# Patient Record
Sex: Female | Born: 1960
Health system: Southern US, Community
[De-identification: ages and names within clinical notes are randomized; demographics above are authoritative.]

## PROBLEM LIST (undated history)

## (undated) DIAGNOSIS — M545 Low back pain, unspecified: Secondary | ICD-10-CM

## (undated) DIAGNOSIS — M722 Plantar fascial fibromatosis: Secondary | ICD-10-CM

## (undated) DIAGNOSIS — F32A Depression, unspecified: Secondary | ICD-10-CM

## (undated) DIAGNOSIS — M5430 Sciatica, unspecified side: Secondary | ICD-10-CM

## (undated) DIAGNOSIS — G5621 Lesion of ulnar nerve, right upper limb: Secondary | ICD-10-CM

## (undated) DIAGNOSIS — K603 Anal fistula, unspecified: Secondary | ICD-10-CM

## (undated) DIAGNOSIS — M542 Cervicalgia: Secondary | ICD-10-CM

## (undated) DIAGNOSIS — R197 Diarrhea, unspecified: Secondary | ICD-10-CM

## (undated) DIAGNOSIS — Z8719 Personal history of other diseases of the digestive system: Secondary | ICD-10-CM

## (undated) DIAGNOSIS — I700 Atherosclerosis of aorta: Secondary | ICD-10-CM

## (undated) DIAGNOSIS — M5126 Other intervertebral disc displacement, lumbar region: Secondary | ICD-10-CM

## (undated) DIAGNOSIS — E039 Hypothyroidism, unspecified: Secondary | ICD-10-CM

## (undated) DIAGNOSIS — M5136 Other intervertebral disc degeneration, lumbar region: Secondary | ICD-10-CM

## (undated) DIAGNOSIS — F329 Major depressive disorder, single episode, unspecified: Secondary | ICD-10-CM

## (undated) DIAGNOSIS — R109 Unspecified abdominal pain: Secondary | ICD-10-CM

## (undated) DIAGNOSIS — G5601 Carpal tunnel syndrome, right upper limb: Principal | ICD-10-CM

## (undated) DIAGNOSIS — M51369 Other intervertebral disc degeneration, lumbar region without mention of lumbar back pain or lower extremity pain: Secondary | ICD-10-CM

## (undated) DIAGNOSIS — R7303 Prediabetes: Secondary | ICD-10-CM

## (undated) DIAGNOSIS — K219 Gastro-esophageal reflux disease without esophagitis: Secondary | ICD-10-CM

## (undated) DIAGNOSIS — R200 Anesthesia of skin: Secondary | ICD-10-CM

## (undated) DIAGNOSIS — M7020 Olecranon bursitis, unspecified elbow: Secondary | ICD-10-CM

## (undated) DIAGNOSIS — E559 Vitamin D deficiency, unspecified: Secondary | ICD-10-CM

## (undated) DIAGNOSIS — R29898 Other symptoms and signs involving the musculoskeletal system: Secondary | ICD-10-CM

## (undated) DIAGNOSIS — M25512 Pain in left shoulder: Secondary | ICD-10-CM

## (undated) DIAGNOSIS — M069 Rheumatoid arthritis, unspecified: Secondary | ICD-10-CM

## (undated) DIAGNOSIS — Z9889 Other specified postprocedural states: Secondary | ICD-10-CM

## (undated) DIAGNOSIS — D509 Iron deficiency anemia, unspecified: Secondary | ICD-10-CM

## (undated) HISTORY — DX: Other specified postprocedural states: Z98.890

## (undated) HISTORY — DX: Prediabetes: R73.03

## (undated) HISTORY — PX: COLONOSCOPY: SHX174

## (undated) HISTORY — PX: ENDOMETRIAL ABLATION: SHX621

## (undated) HISTORY — DX: Depression, unspecified: F32.A

## (undated) HISTORY — DX: Anal fistula: K60.3

## (undated) HISTORY — DX: Lesion of ulnar nerve, right upper limb: G56.21

## (undated) HISTORY — DX: Gastro-esophageal reflux disease without esophagitis: K21.9

## (undated) HISTORY — PX: OTHER SURGICAL HISTORY: SHX169

## (undated) HISTORY — PX: MOHS SURGERY: SHX181

## (undated) HISTORY — DX: Unspecified abdominal pain: R10.9

## (undated) HISTORY — DX: Anal fistula, unspecified: K60.30

## (undated) HISTORY — PX: GASTRIC BYPASS: SHX52

## (undated) HISTORY — PX: ESOPHAGOGASTRODUODENOSCOPY: SHX1529

## (undated) HISTORY — PX: BREAST LUMPECTOMY: SHX2

## (undated) HISTORY — DX: Major depressive disorder, single episode, unspecified: F32.9

## (undated) HISTORY — DX: Carpal tunnel syndrome, right upper limb: G56.01

---

## 2001-01-08 ENCOUNTER — Other Ambulatory Visit: Admission: RE | Admit: 2001-01-08 | Discharge: 2001-01-08 | Payer: Self-pay | Admitting: Obstetrics & Gynecology

## 2001-04-10 ENCOUNTER — Ambulatory Visit (HOSPITAL_COMMUNITY): Admission: RE | Admit: 2001-04-10 | Discharge: 2001-04-10 | Payer: Self-pay | Admitting: Family Medicine

## 2001-04-10 ENCOUNTER — Encounter: Payer: Self-pay | Admitting: Family Medicine

## 2001-05-14 ENCOUNTER — Encounter: Payer: Self-pay | Admitting: Family Medicine

## 2001-05-14 ENCOUNTER — Ambulatory Visit (HOSPITAL_COMMUNITY): Admission: RE | Admit: 2001-05-14 | Discharge: 2001-05-14 | Payer: Self-pay | Admitting: Family Medicine

## 2001-08-03 ENCOUNTER — Ambulatory Visit (HOSPITAL_COMMUNITY): Admission: RE | Admit: 2001-08-03 | Discharge: 2001-08-03 | Payer: Self-pay | Admitting: Family Medicine

## 2002-02-19 ENCOUNTER — Other Ambulatory Visit: Admission: RE | Admit: 2002-02-19 | Discharge: 2002-02-19 | Payer: Self-pay | Admitting: Obstetrics & Gynecology

## 2002-02-25 ENCOUNTER — Encounter: Payer: Self-pay | Admitting: Obstetrics & Gynecology

## 2002-02-25 ENCOUNTER — Ambulatory Visit (HOSPITAL_COMMUNITY): Admission: RE | Admit: 2002-02-25 | Discharge: 2002-02-25 | Payer: Self-pay | Admitting: Obstetrics & Gynecology

## 2002-04-30 ENCOUNTER — Encounter (HOSPITAL_BASED_OUTPATIENT_CLINIC_OR_DEPARTMENT_OTHER): Payer: Self-pay | Admitting: General Surgery

## 2002-05-02 ENCOUNTER — Encounter (INDEPENDENT_AMBULATORY_CARE_PROVIDER_SITE_OTHER): Payer: Self-pay | Admitting: *Deleted

## 2002-05-02 ENCOUNTER — Ambulatory Visit (HOSPITAL_COMMUNITY): Admission: RE | Admit: 2002-05-02 | Discharge: 2002-05-02 | Payer: Self-pay | Admitting: General Surgery

## 2002-11-04 ENCOUNTER — Ambulatory Visit (HOSPITAL_COMMUNITY): Admission: RE | Admit: 2002-11-04 | Discharge: 2002-11-04 | Payer: Self-pay | Admitting: Family Medicine

## 2002-11-04 ENCOUNTER — Encounter: Payer: Self-pay | Admitting: Family Medicine

## 2003-12-09 ENCOUNTER — Other Ambulatory Visit: Admission: RE | Admit: 2003-12-09 | Discharge: 2003-12-09 | Payer: Self-pay | Admitting: Obstetrics & Gynecology

## 2004-06-16 ENCOUNTER — Encounter: Admission: RE | Admit: 2004-06-16 | Discharge: 2004-06-16 | Payer: Self-pay | Admitting: Infectious Diseases

## 2005-01-14 ENCOUNTER — Other Ambulatory Visit: Admission: RE | Admit: 2005-01-14 | Discharge: 2005-01-14 | Payer: Self-pay | Admitting: Obstetrics & Gynecology

## 2007-12-10 ENCOUNTER — Ambulatory Visit (HOSPITAL_COMMUNITY): Admission: RE | Admit: 2007-12-10 | Discharge: 2007-12-10 | Payer: Self-pay | Admitting: General Surgery

## 2007-12-10 ENCOUNTER — Encounter (INDEPENDENT_AMBULATORY_CARE_PROVIDER_SITE_OTHER): Payer: Self-pay | Admitting: *Deleted

## 2008-05-23 ENCOUNTER — Ambulatory Visit (HOSPITAL_COMMUNITY): Admission: RE | Admit: 2008-05-23 | Discharge: 2008-05-23 | Payer: Self-pay | Admitting: Obstetrics & Gynecology

## 2008-05-23 ENCOUNTER — Encounter (INDEPENDENT_AMBULATORY_CARE_PROVIDER_SITE_OTHER): Payer: Self-pay | Admitting: Obstetrics & Gynecology

## 2008-08-19 ENCOUNTER — Ambulatory Visit (HOSPITAL_COMMUNITY): Admission: RE | Admit: 2008-08-19 | Discharge: 2008-08-19 | Payer: Self-pay | Admitting: Family Medicine

## 2008-08-19 ENCOUNTER — Encounter (INDEPENDENT_AMBULATORY_CARE_PROVIDER_SITE_OTHER): Payer: Self-pay | Admitting: *Deleted

## 2008-08-27 ENCOUNTER — Encounter (HOSPITAL_COMMUNITY): Admission: RE | Admit: 2008-08-27 | Discharge: 2008-09-26 | Payer: Self-pay | Admitting: Family Medicine

## 2008-08-27 ENCOUNTER — Encounter (INDEPENDENT_AMBULATORY_CARE_PROVIDER_SITE_OTHER): Payer: Self-pay | Admitting: *Deleted

## 2009-11-19 ENCOUNTER — Encounter (INDEPENDENT_AMBULATORY_CARE_PROVIDER_SITE_OTHER): Payer: Self-pay | Admitting: *Deleted

## 2009-12-24 ENCOUNTER — Encounter: Payer: Self-pay | Admitting: Internal Medicine

## 2010-01-01 DIAGNOSIS — K603 Anal fistula, unspecified: Secondary | ICD-10-CM | POA: Insufficient documentation

## 2010-01-01 DIAGNOSIS — R109 Unspecified abdominal pain: Secondary | ICD-10-CM | POA: Insufficient documentation

## 2010-01-01 DIAGNOSIS — K449 Diaphragmatic hernia without obstruction or gangrene: Secondary | ICD-10-CM | POA: Insufficient documentation

## 2010-01-01 DIAGNOSIS — K219 Gastro-esophageal reflux disease without esophagitis: Secondary | ICD-10-CM | POA: Insufficient documentation

## 2010-01-04 DIAGNOSIS — E0590 Thyrotoxicosis, unspecified without thyrotoxic crisis or storm: Secondary | ICD-10-CM | POA: Insufficient documentation

## 2010-01-07 ENCOUNTER — Ambulatory Visit: Payer: Self-pay | Admitting: Internal Medicine

## 2010-01-15 DIAGNOSIS — Z9889 Other specified postprocedural states: Secondary | ICD-10-CM

## 2010-01-15 HISTORY — DX: Other specified postprocedural states: Z98.890

## 2010-01-18 ENCOUNTER — Ambulatory Visit: Payer: Self-pay | Admitting: Internal Medicine

## 2010-01-20 ENCOUNTER — Encounter: Payer: Self-pay | Admitting: Internal Medicine

## 2010-02-05 ENCOUNTER — Ambulatory Visit (HOSPITAL_COMMUNITY): Admission: RE | Admit: 2010-02-05 | Discharge: 2010-02-05 | Payer: Self-pay | Admitting: Family Medicine

## 2010-11-18 NOTE — Letter (Signed)
Summary: New Patient letter  [facility]  [address] [place], [place] [address]   Phone: [phone]  Fax: [phone]       11/19/2009 MRN: [id number]  [name] [address] [place], [place]  [address]  Dear Ms. Dente,  Welcome to the Gastroenterology Division at [facility].    You are scheduled to see Dr.  Juanda Chance on 01-07-10 at 9:15a.m. on the 3rd floor at [facility] [address]  We ask that you try to arrive at our office 15 minutes prior to your appointment time to allow for check-in.  We would like you to complete the enclosed self-administered evaluation form prior to your visit and bring it with you on the day of your appointment.  We will review it with you.  Also, please bring a complete list of all your medications or, if you prefer, bring the medication bottles and we will list them.  Please bring your insurance card so that we may make a copy of it.  If your insurance requires a referral to see a specialist, please bring your referral form from your primary care physician.  Co-payments are due at the time of your visit and may be paid by cash, check or credit card.     Your office visit will consist of a consult with your physician (includes a physical exam), any laboratory testing he/she may order, scheduling of any necessary diagnostic testing (e.g. x-ray, ultrasound, CT-scan), and scheduling of a procedure (e.g. Endoscopy, Colonoscopy) if required.  Please allow enough time on your schedule to allow for any/all of these possibilities.    If you cannot keep your appointment, please call [phone] to cancel or reschedule prior to your appointment date.  This allows [place] the opportunity to schedule an appointment for another patient in need of care.  If you do not cancel or reschedule by 5 p.m. the business day prior to your appointment date, you will be charged a $50.00 late cancellation/no-show fee.    Thank you for choosing  [facility] for your medical needs.  We appreciate the opportunity to care for you.  Please visit [place] at our website  to learn more about our practice.                     Sincerely,                                                             The Gastroenterology Division

## 2010-11-18 NOTE — Assessment & Plan Note (Signed)
Summary: abdominal pain/gerd--ch.   History of Present Illness Visit Type: Initial Consult Primary GI MD: Lina Sar MD Primary Provider: Tessie Fass Requesting Provider: Tessie Fass Chief Complaint: abdominal pain with N/V, and also c/o Gerd at night History of Present Illness:   This is a 50 year old white female with epigastric pain, gastroesophageal reflux and rectal itching. She is status post mini gastric bypass in March 2009 at [facility] by Dr Clent Ridges. She has lost 84 pounds and has 15 more pounds to go. An upper GI series prior to surgery showed a small sliding hiatal hernia and thickened gastric folds consistent with gastritis. She had an anal fistula repaired in July 2007 and is having constipation and occasional rectal itching. Patient denies dysphagia but complains of food backing up at night despite taking Protonix 40 mg every morning. She had an episode of heartburn lasting all night last night after she had a barbecue supper at 8 PM and went to bed at 10 PM.   GI Review of Systems    Reports abdominal pain, acid reflux, belching, bloating, dysphagia with solids, heartburn, nausea, and  vomiting.     Location of  Abdominal pain: epigastric area.    Denies chest pain, dysphagia with liquids, loss of appetite, vomiting blood, weight loss, and  weight gain.        Denies anal fissure, black tarry stools, change in bowel habit, constipation, diarrhea, diverticulosis, fecal incontinence, heme positive stool, hemorrhoids, irritable bowel syndrome, jaundice, light color stool, liver problems, rectal bleeding, and  rectal pain.    Current Medications (verified): 1)  Pantoprazole Sodium 40 Mg Tbec (Pantoprazole Sodium) .Marland Kitchen.. 1 By Mouth Once Daily 2)  Levothyroxine Sodium 50 Mcg Tabs (Levothyroxine Sodium) .Marland Kitchen.. 1 By Mouth Once Daily 3)  Stool Softener 100 Mg Caps (Docusate Sodium) .Marland Kitchen.. 1 By Mouth Once Daily 4)  Vitamin D3 1000 Unit Tabs (Cholecalciferol) .Marland Kitchen.. 1 By Mouth  Two Times A Day 5)  Biotin 5000 5 Mg Caps (Biotin) .Marland Kitchen.. 1 By Mouth Once Daily 6)  Daily Vitamins  Tabs (Multiple Vitamin) .... 3 By Mouth Once Daily 7)  Tums 500 Mg Chew (Calcium Carbonate Antacid) .... 2 By Mouth Three Times A Day  Allergies (verified): 1)  ! Pcn  Past History:  Past Medical History: Current Problems:  Hx of ANAL FISTULA (ICD-565.1) HIATAL HERNIA (ICD-553.3) GERD (ICD-530.81) ABDOMINAL PAIN, UNSPECIFIED SITE (ICD-789.00) Arthritis Depression severe constipation  Past Surgical History: Reviewed history from 01/01/2010 and no changes required. Anal Fistulostomy w/marsupialization and excision of anal tags Gastric Bypass  Family History: No FH of Colon Cancer: Family History of Diabetes: Aunts, Uncles Family History of Heart Disease: Cousin, Father, Uncles Family History of Liver Disease:father  Social History: Patient is a former smoker. -stopped in 2001 Illicit Drug Use - no married 1 child Daily Caffeine Use  Review of Systems       The patient complains of anxiety-new, back pain, thirst - excessive, and vision changes.  The patient denies allergy/sinus, anemia, arthritis/joint pain, blood in urine, breast changes/lumps, change in vision, confusion, cough, coughing up blood, depression-new, fainting, fatigue, fever, headaches-new, hearing problems, heart murmur, heart rhythm changes, itching, menstrual pain, muscle pains/cramps, night sweats, nosebleeds, pregnancy symptoms, shortness of breath, skin rash, sleeping problems, sore throat, swelling of feet/legs, swollen lymph glands, thirst - excessive , urination - excessive , urination changes/pain, urine leakage, and voice change.         Pertinent positive and negative review of systems were  noted in the above HPI. All other ROS was otherwise negative.   Vital Signs:  Patient profile:   50 year old female Height:      65 inches Weight:      177 pounds BMI:     29.56 BSA:     1.88 Pulse rate:   58  / minute Pulse rhythm:   regular BP sitting:   102 / 70  (left arm)  Vitals Entered By: Merri Ray CMA Duncan Dull) (January 07, 2010 9:33 AM)  Physical Exam  General:  Well developed, well nourished, no acute distress. Eyes:  PERRLA, no icterus. Mouth:  No deformity or lesions, dentition normal. Neck:  Supple; no masses or thyromegaly. Lungs:  Clear throughout to auscultation. Heart:  Regular rate and rhythm; no murmurs, rubs,  or bruits. Abdomen:  soft abdomen with normoactive bowel sounds. No distention. No tenderness. Prominent subxiphoid process. No palpable mass. Post laparoscopic surgery scars. Rectal:  external hemorrhoidal tags with normal rectal tone. Stool is impacted and there is a large amount of Hemoccult-negative stool. Extremities:  No clubbing, cyanosis, edema or deformities noted. Skin:  Intact without significant lesions or rashes. Psych:  Alert and cooperative. Normal mood and affect.   Impression & Recommendations:  Problem # 1:  Hx of ANAL FISTULA (ICD-565.1) Patient is status post past repair of an anal fistula. She now has rectal itching likely related to redundant rectal tissue and straining. I have advised her to stay on stool softeners one a day and use prune juice and probiotics to improve her bowel habits. She is 50 years old and we will go ahead and schedule a colonoscopy.  Problem # 2:  GERD (ICD-530.81)  Patient has progressive gastroesophageal reflux likely related to her mini gastric bypass surgery resulting in a small gastric pouch. She has increased gastroesophageal reflux at night as well as during the day. We will increase her Protonix to 40 mg twice a day and if not covered by her insurance we will substitute omeprazole or Prilosec for the second Protonix. I have talked to her about cutting back on the size of her meals at night and not not eating for at least 3 hours prior to retiring at night. She may benefit from Reglan in the future. We will  schedule an upper endoscopy to assess the size of the gastric pouch and to rule out Barrett's esophagus.  Orders: Colon/Endo (Colon/Endo)  Patient Instructions: 1)  antireflux measures. 2)  Increase her Protonix to 40 mg p.o. b.i.d. 3)  Upper endoscopy and colonoscopy. 4)  Stool softeners daily. 5)  Calmoseptine solution to use p.r.n. rectal itching. 6)  Copy sent to : Dr Lilyan Punt 7)  The medication list was reviewed and reconciled.  All changed / newly prescribed medications were explained.  A complete medication list was provided to the patient / caregiver. Prescriptions: PANTOPRAZOLE SODIUM 40 MG TBEC (PANTOPRAZOLE SODIUM) Take 1 tablet by mouth two times a day  #60 x 3   Entered by:   Hortense Ramal CMA (AAMA)   Authorized by:   Hart Carwin MD   Signed by:   Hortense Ramal CMA (AAMA) on 01/07/2010   Method used:   Electronically to        [facility] (retail)       [address]       [place], [place]  [address]       Ph: [phone]  Fax: [phone]   RxID:   [id number] DULCOLAX 5 MG  TBEC (BISACODYL) Day before procedure take 2 at 3pm and 2 at 8pm.  #4 x 0   Entered by:   Hortense Ramal CMA (AAMA)   Authorized by:   Hart Carwin MD   Signed by:   Hortense Ramal CMA (AAMA) on 01/07/2010   Method used:   Electronically to        [facility] (retail)       [address]       [place], [place]  [address]       Ph: [phone]       Fax: [phone]   RxID:   [id number] REGLAN 10 MG  TABS (METOCLOPRAMIDE HCL) As per prep instructions.  #2 x 0   Entered by:   Hortense Ramal CMA (AAMA)   Authorized by:   Hart Carwin MD   Signed by:   Hortense Ramal CMA (AAMA) on 01/07/2010   Method used:   Electronically to        [facility] (retail)       [address]       [place], [place]  [address]       Ph: [phone]       Fax: [phone]   RxID:    [id number] MIRALAX   POWD (POLYETHYLENE GLYCOL 3350) As per prep  instructions.  #255gm x 0   Entered by:   Hortense Ramal CMA (AAMA)   Authorized by:   Hart Carwin MD   Signed by:   Hortense Ramal CMA (AAMA) on 01/07/2010   Method used:   Electronically to        [facility] (retail)       [address]       [place], [place]  [address]       Ph: [phone]       Fax: [phone]   RxID:   [id number]

## 2010-11-18 NOTE — Procedures (Signed)
Summary: Colonoscopy  Patient: [name] Note: All result statuses are Final unless otherwise noted.  Tests: (1) Colonoscopy (COL)   COL Colonoscopy           DONE     [facility]     [address]     [place], [place]  [address]           COLONOSCOPY PROCEDURE REPORT           PATIENT:  [name], [name]  MR#:  [id number]     BIRTHDATE:  1960-11-09, 49 yrs. old  GENDER:  female     ENDOSCOPIST:  Hedwig Morton. Juanda Chance, MD     REF. BY:  Lilyan Punt, M.D.     PROCEDURE DATE:  01/18/2010     PROCEDURE:  Colonoscopy [address]     ASA CLASS:  Class II     INDICATIONS:  hematochezia, change in bowel habits anal fistula     repaired 2007     MEDICATIONS:   Versed 2 mg, Fentanyl 50 mcg           DESCRIPTION OF PROCEDURE:   After the risks benefits and     alternatives of the procedure were thoroughly explained, informed     consent was obtained.  Digital rectal exam was performed and     revealed no rectal masses.   The LB [id number] endoscope     was introduced through the anus and advanced to the cecum, which     was identified by both the appendix and ileocecal valve, without     limitations.  The quality of the prep was good, using MiraLax.     The instrument was then slowly withdrawn as the colon was fully     examined.     <<PROCEDUREIMAGES>>           FINDINGS:  Three polyps were found. 3 diminutive polyps at 10 cm     The polyps were removed using cold biopsy forceps (see image9 and     image8).  Mild diverticulosis was found.  This was otherwise a     normal examination of the colon (see image1, image3, image4,     image5, and image10).   Retroflexed views in the rectum revealed     no abnormalities.    The scope was then withdrawn from the patient     and the procedure completed.           COMPLICATIONS:  None     ENDOSCOPIC IMPRESSION:     1) Three polyps     2) Mild diverticulosis     3) Otherwise normal examination     anorectal source for hematochezia     RECOMMENDATIONS:     1) Await pathology results     Calmoseptine ointment,samples given, use prn itching and     irritation     REPEAT EXAM:  In 10 year(s) for.           ______________________________     Hedwig Morton. Juanda Chance, MD           CC:           n.     eSIGNED:   Hedwig Morton. Natonya Finstad at 01/18/2010 04:27 PM           Chryl Heck, [id number]  Note: An exclamation mark (!) indicates a result that was not dispersed into the flowsheet. Document Creation Date: 01/18/2010 4:28 PM _______________________________________________________________________  (1) Order result status:  Final Collection or observation date-time: 01/18/2010 16:04 Requested date-time:  Receipt date-time:  Reported date-time:  Referring Physician:   Ordering Physician: Lina Sar [phone]) Specimen Source:  Source: Launa Grill Order Number: [id number] Lab site:   Appended Document: Colonoscopy     Procedures Next Due Date:    Colonoscopy: 01/2020

## 2010-11-18 NOTE — Procedures (Signed)
Summary: Upper Endoscopy  Patient: [name] Note: All result statuses are Final unless otherwise noted.  Tests: (1) Upper Endoscopy (EGD)   EGD Upper Endoscopy       DONE     [facility]     [address]     [place], [place]  [address]           ENDOSCOPY PROCEDURE REPORT           PATIENT:  [name], [name]  MR#:  [id number]     BIRTHDATE:  1961/01/25, 49 yrs. old  GENDER:  female           ENDOSCOPIST:  Hedwig Morton. Juanda Chance, MD     Referred by:  Lilyan Punt, M.D.           PROCEDURE DATE:  01/18/2010     PROCEDURE:  EGD with biopsy     ASA CLASS:  Class II     INDICATIONS:  heartburn, GERD, abdominal pain s/p gastric     minibypass 12/2007 in [place] food regurgitates at night     refractory to Protonix           MEDICATIONS:   Versed 8 mg, Fentanyl 50 mcg     TOPICAL ANESTHETIC:  Exactacain Spray           DESCRIPTION OF PROCEDURE:   After the risks benefits and     alternatives of the procedure were thoroughly explained, informed     consent was obtained.  The LB GIF-H180 [id number] endoscope was     introduced through the mouth and advanced to the proximal jejunum,     without limitations.  The instrument was slowly withdrawn as the     mucosa was fully examined.     <<PROCEDUREIMAGES>>           Post-operative change was noted (see image1, image2, image3, and     image10). g-e junction at 35 cm, appears normal, no hiatal hernis     gastric staple at 39 cm,generous opening into antrum, scope passes     easily,     proximal puch size is 4 cm, there is bile cvollected in the pouch     Post-operative change was noted (see image9, image8, image7, and     image6). functioning gastrojejunostomy with afferent and efferent     limb, and bile reflux  other findings (see image5 and image4).     normal jejunum and duodenum,    Retroflexed views revealed no     abnormalities.    The scope was then withdrawn from the patient     and the procedure completed.        COMPLICATIONS:  None           ENDOSCOPIC IMPRESSION:     1) Post-operative change     2) Post-operative change     3) Other findings     #1 gastric sta[ple resulting in a 4 cm gastric pouch with a     generous opening into antrum     #2 gastrojejunostomy, bile reflux     s/p small bolw biopsies     RECOMMENDATIONS:     1) Await pathology results     PPI bid,     small feedings, low roughage diet     Reglan 5 mg hs,#30, 1 refill           REPEAT EXAM:  In 0 year(s) for.  ______________________________     Hedwig Morton. Juanda Chance, MD           CC:           n.     eSIGNED:   Hedwig Morton. Dareon Nunziato at 01/18/2010 04:21 PM           Page 2 of 3   [name], [name], [id number]  Note: An exclamation mark (!) indicates a result that was not dispersed into the flowsheet. Document Creation Date: 01/18/2010 4:22 PM _______________________________________________________________________  (1) Order result status: Final Collection or observation date-time: 01/18/2010 15:44 Requested date-time:  Receipt date-time:  Reported date-time:  Referring Physician:   Ordering Physician: Lina Sar [phone]) Specimen Source:  Source: Launa Grill Order Number: [id number] Lab site:

## 2010-11-18 NOTE — Letter (Signed)
Summary: [facility] Instructions  [facility]  [address] [place], [place] [address]   Phone: [phone]  Fax: [phone]       [name]    [birth year]    MRN: [id number]       Procedure Day /Date: 01/18/10 Monday     Arrival Time: 2:00 pm     Procedure Time: 3:00 pm     Location of Procedure:                    _x _  [facility] (4th Floor)  PREPARATION FOR COLONOSCOPY WITH MIRALAX  Starting 5 days prior to your procedure (01/13/10) do not eat nuts, seeds, popcorn, corn, beans, peas,  salads, or any raw vegetables.  Do not take any fiber supplements (e.g. Metamucil, Citrucel, and Benefiber). ____________________________________________________________________________________________________   THE DAY BEFORE YOUR PROCEDURE         DATE: 01/17/10 DAY: Sunday  1   Drink clear liquids the entire day-NO SOLID FOOD  2   Do not drink anything colored red or purple.  Avoid juices with pulp.  No orange juice.  3   Drink at least 64 oz. (8 glasses) of fluid/clear liquids during the day to prevent dehydration and help the prep work efficiently.  CLEAR LIQUIDS INCLUDE: Water Jello Ice Popsicles Tea (sugar ok, no milk/cream) Powdered fruit flavored drinks Coffee (sugar ok, no milk/cream) Gatorade Juice: apple, white grape, white cranberry  Lemonade Clear bullion, consomm, broth Carbonated beverages (any kind) Strained chicken noodle soup Hard Candy  4   Mix the entire bottle of Miralax with 64 oz. of Gatorade/Powerade in the morning and put in the refrigerator to chill.  5   At 3:00 pm take 2 Dulcolax/Bisacodyl tablets.  6   At 4:30 pm take one Reglan/Metoclopramide tablet.  7  Starting at 5:00 pm drink one 8 oz glass of the Miralax mixture every 15-20 minutes until you have finished drinking the entire 64 oz.  You should pace yourself to insure that you can keep the liquid down. It normally takes around 2 hours to drink, but due to your gastric  stapling, it may take 4-6 hours for you to drink the entire 64 ounces.  8   If you are nauseated, you may take the 2nd Reglan/Metoclopramide tablet at 8:30 pm.        9    At 10:00 pm take 2 more DULCOLAX/Bisacodyl tablets.       THE DAY OF YOUR PROCEDURE      DATE:  01/18/10 DAY: Monday  You may drink clear liquids until 1:00 pm  (2 HOURS BEFORE PROCEDURE).   MEDICATION INSTRUCTIONS  Unless otherwise instructed, you should take regular prescription medications with a small sip of water as early as possible the morning of your procedure.         OTHER INSTRUCTIONS  You will need a responsible adult at least 49 years of age to accompany you and drive you home.   This person must remain in the waiting room during your procedure.  Wear loose fitting clothing that is easily removed.  Leave jewelry and other valuables at home.  However, you may wish to bring a book to read or an iPod/MP3 player to listen to music as you wait for your procedure to start.  Remove all body piercing jewelry and leave at home.  Total time from sign-in until discharge is approximately 2-3 hours.  You should go home directly after  your procedure and rest.  You can resume normal activities the day after your procedure.  The day of your procedure you should not:   Drive   Make legal decisions   Operate machinery   Drink alcohol   Return to work  You will receive specific instructions about eating, activities and medications before you leave.   The above instructions have been reviewed and explained to me by   Hortense Ramal CMA Duncan Dull)  January 07, 2010 10:47 AM     I fully understand and can verbalize these instructions _____________________________ Date 01/07/10

## 2010-11-18 NOTE — Letter (Signed)
Summary: Patient [facility] Biopsy Results  [facility]  [address] [place], [place] [address]   Phone: [phone]  Fax: [phone]        January 20, 2010 MRN: [id number]    ALLYSE FREGEAU [address] [place], [place]  [address]    Dear Ms. Paris,  I am pleased to inform you that the biopsies taken during your recent endoscopic examination did not show any evidence of cancer upon pathologic examination.  Additional information/recommendations:  __No further action is needed at this time.  Please follow-up with      your primary care physician for your other healthcare needs.  __ Please call [phone] to schedule a return visit to review      your condition.  _x_ Continue with the treatment plan as outlined on the day of your      exam.  _   Please call us if you are having persistent problems or have questions about your condition that have not been fully answered at this time.  Sincerely,  Hart Carwin MD  This letter has been electronically signed by your physician.  Appended Document: Patient Notice-Endo Biopsy Results Letter mailed 4.8.11

## 2010-11-18 NOTE — Letter (Signed)
Summary: Patient Notice- Polyp Results  [facility]  [address] [place], [place] [address]   Phone: [phone]  Fax: [phone]        January 20, 2010 MRN: [id number]    MARILYN NIHISER [address] [place], [place]  [address]    Dear Ms. [place],  I am pleased to inform you that the colon polyp(s) removed during your recent colonoscopy was (were) found to be benign (no cancer detected) upon pathologic examination.The polyp was hyperplastic ( not precancerous)  I recommend you have a repeat colonoscopy examination in 10_ years to look for recurrent polyps, as having colon polyps increases your risk for having recurrent polyps or even colon cancer in the future.  Should you develop new or worsening symptoms of abdominal pain, bowel habit changes or bleeding from the rectum or bowels, please schedule an evaluation with either your primary care physician or with me.  Additional information/recommendations: x __ No further action with gastroenterology is needed at this time. Please      follow-up with your primary care physician for your other healthcare      needs.  __ Please call [phone] to schedule a return visit to review your      situation.  __ Please keep your follow-up visit as already scheduled.  __ Continue treatment plan as outlined the day of your exam.  Please call us if you are having persistent problems or have questions about your condition that have not been fully answered at this time.  Sincerely,  Hart Carwin MD  This letter has been electronically signed by your physician.  Appended Document: Patient Notice- Polyp Results Letter mailed 4.8.11

## 2010-11-18 NOTE — Miscellaneous (Signed)
Summary: reglan prescription  Clinical Lists Changes  Medications: Added new medication of REGLAN 5 MG  TABS (METOCLOPRAMIDE HCL) take one at bedtime - Signed Rx of REGLAN 5 MG  TABS (METOCLOPRAMIDE HCL) take one at bedtime;  #30 x 1;  Signed;  Entered by: Laverna Peace RN;  Authorized by: Hart Carwin MD;  Method used: Electronically to [facility] [address], [place], [place], [place]  [address], Ph: [phone], Fax: [phone]    Prescriptions: REGLAN 5 MG  TABS (METOCLOPRAMIDE HCL) take one at bedtime  #30 x 1   Entered by:   Laverna Peace RN   Authorized by:   Hart Carwin MD   Signed by:   Laverna Peace RN on 01/18/2010   Method used:   Electronically to        [facility] (retail)       [address]       [place], [place]  [address]       Ph: [phone]       Fax: [phone]   RxID:   [id number]

## 2010-11-18 NOTE — Op Note (Signed)
Summary: Anal Fistula                     [place]  Patient:    [name], [name] Visit Number: [id number] MRN: [id number]          Service Type: DSU Location: [facility] 2870 01 Attending Physician:  Sonda Primes Dictated by:   Mardene Celeste Lurene Shadow, M.D. Proc. Date: 05/02/02 Admit Date:  05/02/2002 Discharge Date: 05/02/2002                             Operative Report  PREOPERATIVE DIAGNOSIS:  Anal fistula.  POSTOPERATIVE DIAGNOSIS:  Anal fistula.  OPERATION PERFORMED:  Proctosigmoidoscopy to 25 cm.  Anal fistulostomy with marsupialization and excision of anal tags.  SURGEON:  Mardene Celeste. Lurene Shadow, M.D.  ASSISTANT:  Nurse.  ANESTHESIA:  General.  INDICATIONS FOR PROCEDURE:  The patient is a 50 year old female who presents with recurrent draining sinus in the perianal tissues.  She on evaluation has what appears to be a transphincteric fistula.  She comes to the operating room after the risks and benefits of surgery have been fully discussed with her. All questions were answered and she gives full consent.  DESCRIPTION OF PROCEDURE:  Following the induction of satisfactory anesthesia with the patient positioned in the prone jackknife position, the perianal tissues were prepped and draped to be included in the sterile operative field. A proctosigmoidoscope was passed up to 25 cm without difficulty, no addition mucosal abnormalities were seen.  The fistula was cannulated with a probe and then with a blunt needle with peroxide is injected into the fistula and the internal fistulous opening could be seen in the inferior midline.  The probe was passed through all the way into the internal fistulous opening.  It was noted that it goes across the external sphincter just with taking approximately the distal 25 cm of the anal sphincter.  The fistula was cut down on with electrocautery after with injecting the fistulous tract with 0.5% Marcaine with  epinephrine, the tract was completely opened up to the anal verge and into the anal opening.  Hemostasis was obtained with the electrocautery.  The tract was then scraped clean of all the debris within the tract and the tract was then marsupialized with a running 2-0 chromic catgut suture catching the mucosa and skin to the base of the fistula.  A small anal tag at the anal verge right at where the fistula is was removed.  The fistulous tract was packed with Xeroform gauze and covered with a sterile dressing.  Anesthetic reversed.  Patient removed from the operating room to the recovery room in stable condition having tolerated the procedure well. Dictated by:   Mardene Celeste. Lurene Shadow, M.D. Attending Physician:  Sonda Primes DD:  05/02/02 TD:  05/06/02 Job: [id number]

## 2011-01-06 ENCOUNTER — Encounter: Payer: Self-pay | Admitting: Gastroenterology

## 2011-01-17 ENCOUNTER — Encounter: Payer: Self-pay | Admitting: Gastroenterology

## 2011-01-18 ENCOUNTER — Other Ambulatory Visit: Payer: Self-pay | Admitting: Internal Medicine

## 2011-01-18 ENCOUNTER — Ambulatory Visit (INDEPENDENT_AMBULATORY_CARE_PROVIDER_SITE_OTHER): Payer: 59 | Admitting: Gastroenterology

## 2011-01-18 ENCOUNTER — Encounter: Payer: Self-pay | Admitting: Gastroenterology

## 2011-01-18 DIAGNOSIS — K5900 Constipation, unspecified: Secondary | ICD-10-CM

## 2011-01-18 DIAGNOSIS — K219 Gastro-esophageal reflux disease without esophagitis: Secondary | ICD-10-CM

## 2011-01-18 DIAGNOSIS — R1032 Left lower quadrant pain: Secondary | ICD-10-CM

## 2011-01-18 NOTE — Patient Instructions (Signed)
Continue Protonix as directed Complete Upper GI x ray to assess your anatomy We will obtain the operative report from your surgeon Make sure you are drinking 6-8 glasses of water a day Keep a diary of when you have episodes of diarrhea; this may be contributed to foods you are eating and may not realize it Continue stool softeners daily. May take Miralax as needed for constipation.  After review of xray, we will determine your follow-up appt.

## 2011-01-19 ENCOUNTER — Encounter: Payer: Self-pay | Admitting: Gastroenterology

## 2011-01-19 DIAGNOSIS — K5900 Constipation, unspecified: Secondary | ICD-10-CM | POA: Insufficient documentation

## 2011-01-19 DIAGNOSIS — R1032 Left lower quadrant pain: Secondary | ICD-10-CM | POA: Insufficient documentation

## 2011-01-19 NOTE — Progress Notes (Signed)
Pt likely has some form of dumping syndrome after overeating in light of her mini GB or IBS-mixed. Weight 177 lbs in 2011. Please call pt. She needs needs to add Benefiber once daily to address constipation and follow her post-gastrectomy diet to prevent loose stools. Use Miralax only 1-2 times a week to have a BM.

## 2011-01-19 NOTE — Assessment & Plan Note (Signed)
50 year old female s/p mini gastric bypass in 2009 by Dr. Clent Ridges with chronic GERD. Controlled on BID Protonix; however, does experience nocturnal reflux depending on timing of meal/liquids in evening. Will likely have issues with this long-term due to anatomy of operation: prone to bile reflux and ulcer formation. Will likely need PPI indefinitely; monitor for signs/symptoms of ulcers. Pt reports decreased early satiety, which is not uncommon this far out from procedure. Discussed with pt diet and behavior modification. Short of revision surgery, intervention will likely need to be nutrition counseling and exercise. Due to lack of records, will proceed with Upper GI barium swallow to assess anatomy, pouch integrity, etc.  1. Upper GI 2. Continue Protonix BID 3. Nutrition Counseling likely in future. 4. Exercise most days of the week

## 2011-01-19 NOTE — Assessment & Plan Note (Signed)
Constipation dominant, on stool softener daily. No warning signs, needs bowel regimen.  1. Stool softeners daily 2. Miralax daily prn constipation. Hold if loose stools. 3. High fiber diet 4. 6-8 glasses of water daily 5. Exercise most days of week

## 2011-01-19 NOTE — Progress Notes (Signed)
Referring Provider: [facility] NP Primary Care Physician:  Lilyan Punt, MD  Chief Complaint  Patient presents with  . Pre-op Exam    previous gastric bypass, EGD    HPI:  [name] is a 50 y.o. female here as a referral from [place] [facility]'s bariatric program secondary to lack of early satiety. She underwent a mini gastric bypass in March 2009 by Dr. Clent Ridges. Pre-op wt 256, now 180. She has done overall fairly well since surgery. She had some issues with increased reflux and abdominal pain last year; she underwent an EGD and colonoscopy by Dr. Juanda Chance. EGD essentially normal, colonoscopy with mild diverticulosis, hyperplastic polyps.  Presents today with main concern of eating more than she used to. Reports some days able to eat a large plate-size of food, other days full of small amounts. Denies N/V or epigastric pain. Has increased reflux if eats right before bed. Admits the majority of her nocturnal reflux is a result of eating too soon before bed or drinking fruit juices in the middle of the night. On Protonix BID. Also reports intermittent LLQ discomfort that immediately precedes "explosion" of loose stool. Only happened a few times in the past few months. Unsure if associated with types of food. Does have constipation predominantly, takes stool softener daily. No other bowel regimen.   Past Medical History  Diagnosis Date  . Anal fistula     hx of   . Hiatal hernia   . GERD (gastroesophageal reflux disease)   . Abdominal pain of unknown etiology   . Arthritis   . Depression   . Constipation     severe  . S/P endoscopy April 2011    Dr. Juanda Chance: no hiatal hernia, generous opening to antrum., patent gastrojejunostomy  . S/P colonoscopy April 2011    Dr. Juanda Chance: mild diverticulosis, hyperplastic polyps, repeat in 10 years    Past Surgical History  Procedure Date  . Anal fistulostomy     w/marsupialization an excision of anal tags  . Gastric bypass     mini  gastric bypass in [employer] reportedly  . Breast lumpectomy     benign    Current Outpatient Prescriptions  Medication Sig Dispense Refill  . Calcium Carbonate (CALCIUM 600 PO) Take 1 tablet by mouth daily.        Jennette Banker Sodium 30-100 MG CAPS Take by mouth.        . Cholecalciferol (VITAMIN D3) 1000 UNITS tablet Take 1,000 Units by mouth daily.       . fish oil-omega-3 fatty acids 1000 MG capsule Take 1 g by mouth daily.        . Glucosamine-Chondroitin-Vit C 2000-1200-60 MG/30ML LIQD Take 1 tablet by mouth 3 (three) times a week.        . levothyroxine (SYNTHROID, LEVOTHROID) 50 MCG tablet Take 50 mcg by mouth daily.        . pantoprazole (PROTONIX) 40 MG tablet Take 40 mg by mouth 2 (two) times daily.        . Biotin (BIOTIN 5000) 5 MG CAPS Take 5 mg by mouth 1 dose over 46 hours.        . metoCLOPramide (REGLAN) 5 MG tablet Take 5 mg by mouth at bedtime.          Allergies as of 01/18/2011 - Review Complete 01/18/2011  Allergen Reaction Noted  . Penicillins  01/07/2010    Family History  Problem Relation Age of Onset  . COPD Mother  living  . Heart disease Father     deceased  . Colon cancer Neg Hx     History   Social History  . Marital Status: Married    Spouse Name: N/A    Number of Children: N/A  . Years of Education: N/A   Occupational History  . Technician     [employer]    Social History Main Topics  . Smoking status: Former Games developer  . Smokeless tobacco: Never Used   Comment: quit 12 years ago  . Alcohol Use: 0.5 oz/week    1 drink(s) per week     social drinker  . Drug Use: No  . Sexually Active: Yes -- Female partner(s)    Birth Control/ Protection: None     spouse   Other Topics Concern  . Not on file   Social History Narrative  . No narrative on file    Review of Systems: Gen: Denies any fever, chills, sweats, anorexia, fatigue, weakness CV: Denies chest pain, angina, palpitations, syncope,  Resp: Denies dyspnea  at rest, dyspnea with exercise, cough, sputum, wheezing, coughing up blood, and pleurisy. GI:See HPI GU : Denies urinary burning, blood in urine, urinary frequency MS: Denies joint pain, limitation of movement, and swelling, stiffness, low back pain, extremity pain. Denies muscle weakness, cramps, atrophy.  Derm: Denies rash, itching, dry skin, hives Psych: Denies depression, anxiety, memory loss, suicidal ideation, hallucinations, paranoia, and confusion. Heme: Denies bruising, bleeding, and enlarged lymph nodes.  Physical Exam: BP 105/65  Pulse 51  Temp 98.2 F (36.8 C)  Ht 5\' 5"  (1.651 m)  Wt 180 lb (81.647 kg)  BMI 29.95 kg/m2  SpO2 100% General:   Alert,  Well-developed, well-nourished, pleasant and cooperative in NAD Head:  Normocephalic and atraumatic. Eyes:  Sclera clear, no icterus.   Conjunctiva pink. Ears:  Normal auditory acuity. Nose:  No deformity, discharge,  or lesions. Mouth:  No deformity or lesions, dentition normal. Lungs:  Clear to auscultation bilaterally.   No wheezes, rales, or rhonchi Heart:  Regular rate and rhythm; no murmurs, clicks, rubs,  or gallops. Abdomen: +BS, soft, non-tender, non-distended. No HSM noted, no rebound or guarding. NO masses.  Msk:  Symmetrical without gross deformities. Normal posture. Extremities:  Without clubbing or edema. Neurologic:  Alert and  oriented x4;  grossly normal neurologically. Skin:  Intact without significant lesions or rashes. Psych:  Alert and cooperative. Normal mood and affect.

## 2011-01-19 NOTE — Assessment & Plan Note (Addendum)
Rare LLQ discomfort immediately preceding explosive bowel movement. Recent colonoscopy reassuring. Likely related to IBS symptoms. Need operative reports from surgery to determine any malabsorptive component: if so, differential would include dumping syndrome.   1. Obtain op reports from [facility] to determine exactly what was performed 2. Keep diary of foods that trigger this 3. High fiber diet   Addendum (late entry): After UGI completed, spoke with pt. Pt does not desire a f/u at this time. States she will call us when she would like to return. Doing well, no distress.  April 18, 2011

## 2011-01-20 ENCOUNTER — Other Ambulatory Visit: Payer: Self-pay | Admitting: Internal Medicine

## 2011-01-20 ENCOUNTER — Ambulatory Visit (HOSPITAL_COMMUNITY)
Admission: RE | Admit: 2011-01-20 | Discharge: 2011-01-20 | Disposition: A | Discharge status: Home / Self Care | Payer: 59 | Source: Ambulatory Visit | Attending: Internal Medicine | Admitting: Internal Medicine

## 2011-01-20 DIAGNOSIS — K219 Gastro-esophageal reflux disease without esophagitis: Secondary | ICD-10-CM | POA: Insufficient documentation

## 2011-01-20 DIAGNOSIS — Z9884 Bariatric surgery status: Secondary | ICD-10-CM | POA: Insufficient documentation

## 2011-01-27 ENCOUNTER — Emergency Department (HOSPITAL_COMMUNITY): Payer: No Typology Code available for payment source

## 2011-01-27 ENCOUNTER — Emergency Department (HOSPITAL_COMMUNITY)
Admission: EM | Admit: 2011-01-27 | Discharge: 2011-01-27 | Disposition: A | Discharge status: Home / Self Care | Payer: No Typology Code available for payment source | Attending: Emergency Medicine | Admitting: Emergency Medicine

## 2011-01-27 DIAGNOSIS — R079 Chest pain, unspecified: Secondary | ICD-10-CM | POA: Insufficient documentation

## 2011-01-27 DIAGNOSIS — E039 Hypothyroidism, unspecified: Secondary | ICD-10-CM | POA: Insufficient documentation

## 2011-01-27 DIAGNOSIS — M545 Low back pain, unspecified: Secondary | ICD-10-CM | POA: Insufficient documentation

## 2011-02-07 NOTE — Progress Notes (Signed)
Pt called back wanted to know why she needed OV if everything was OK at the last OV. Pt getting ready to go on cruise and was told that we would follow up with her in 6 months for her reflux.

## 2011-02-07 NOTE — Progress Notes (Signed)
LMOM for pt to call me back and set up FU OV

## 2011-03-01 NOTE — Op Note (Signed)
NAMEBLAYKLEE, MABLE NO.:  [id number]   MEDICAL RECORD NO.:  [id number]          PATIENT TYPE:  AMB   LOCATION:  SDC                           FACILITY:  [facility]   PHYSICIAN:  Ilda Mori, M.D.   DATE OF BIRTH:  Jun 19, 1961   DATE OF PROCEDURE:  05/23/2008  DATE OF DISCHARGE:                               OPERATIVE REPORT   DATE OF SURGERY:  May 23, 2008.   PREOPERATIVE DIAGNOSES:  Menorrhagia, endometrial polyps.   POSTOPERATIVE DIAGNOSIS:  Menorrhagia.   PROCEDURE:  Hysteroscopy, dilatation and curettage, NovaSure endometrial  ablation.   SURGEON:  Ilda Mori, MD   ANESTHESIA:  General.   ESTIMATED BLOOD LOSS:  Minimal.   FINDINGS:  On hysteroscopy, the endometrium appeared normal.  No  significant endometrial polyps were seen.  The uterus sounded to 10 cm  and the cervix was sounded to 3 cm.  The cavity width on the NovaSure  was 4.8.  The time of ablation was 1 minute and 17 seconds at a power of  172 watts.   INDICATIONS:  This is a 50 year old gravida 3, para 1-0-2-1 female, who  has had over a year of heavy menstruation.  A sonohysterogram was  performed, which appeared to show several small endometrial polyps.  Options were discussed with the patient and a decision was made to  proceed with hysteroscopy, polypectomy, and NovaSure endometrial  ablation.   PROCEDURE:  The patient was taken to the operating room and general  anesthesia was induced.  She was placed in the dorsal lithotomy position  where the perineum and vagina were prepped and draped in the sterile  fashion.  The cervix was sounded with a Hegar dilator to 3 cm.  The  endometrial cavity was sounded to a total of 10 cm leaving a cavity of  approximately 7 cm.  The internal os was dilated to 23-French with Shawnie Pons  dilators.  A hysteroscope was introduced and no endometrial polyps were  appreciated.  A polyp forceps were introduced and no polyps were found.  A D&C was performed  with minimal tissue obtained.  The internal os was  then dilated to 27-French and a NovaSure instrument was placed and  deployed.  The cavity length was set at 6.5 cm.  The cavity width was  read as  4.8 cm.  The CO2 test was passed and the ablation occurred for 1 minute  and 17 seconds.  The hysteroscope was reintroduced and confirmed that  the endometrium had been well ablated.  The procedure was then  terminated and the patient left the operating room in good condition.      Ilda Mori, M.D.  Electronically Signed     RK/MEDQ  D:  05/23/2008  T:  05/23/2008  Job:  [id number]

## 2011-03-04 NOTE — Op Note (Signed)
[place]  Patient:    [name], [name] Visit Number: [id number] MRN: [id number]          Service Type: DSU Location: [facility] 2870 01 Attending Physician:  Sonda Primes Dictated by:   Mardene Celeste Lurene Shadow, M.D. Proc. Date: 05/02/02 Admit Date:  05/02/2002 Discharge Date: 05/02/2002                             Operative Report  PREOPERATIVE DIAGNOSIS:  Anal fistula.  POSTOPERATIVE DIAGNOSIS:  Anal fistula.  OPERATION PERFORMED:  Proctosigmoidoscopy to 25 cm.  Anal fistulostomy with marsupialization and excision of anal tags.  SURGEON:  Mardene Celeste. Lurene Shadow, M.D.  ASSISTANT:  Nurse.  ANESTHESIA:  General.  INDICATIONS FOR PROCEDURE:  The patient is a 50 year old female who presents with recurrent draining sinus in the perianal tissues.  She on evaluation has what appears to be a transphincteric fistula.  She comes to the operating room after the risks and benefits of surgery have been fully discussed with her. All questions were answered and she gives full consent.  DESCRIPTION OF PROCEDURE:  Following the induction of satisfactory anesthesia with the patient positioned in the prone jackknife position, the perianal tissues were prepped and draped to be included in the sterile operative field. A proctosigmoidoscope was passed up to 25 cm without difficulty, no addition mucosal abnormalities were seen.  The fistula was cannulated with a probe and then with a blunt needle with peroxide is injected into the fistula and the internal fistulous opening could be seen in the inferior midline.  The probe was passed through all the way into the internal fistulous opening.  It was noted that it goes across the external sphincter just with taking approximately the distal 25 cm of the anal sphincter.  The fistula was cut down on with electrocautery after with injecting the fistulous tract with 0.5% Marcaine with epinephrine, the tract was  completely opened up to the anal verge and into the anal opening.  Hemostasis was obtained with the electrocautery.  The tract was then scraped clean of all the debris within the tract and the tract was then marsupialized with a running 2-0 chromic catgut suture catching the mucosa and skin to the base of the fistula.  A small anal tag at the anal verge right at where the fistula is was removed.  The fistulous tract was packed with Xeroform gauze and covered with a sterile dressing.  Anesthetic reversed.  Patient removed from the operating room to the recovery room in stable condition having tolerated the procedure well. Dictated by:   Mardene Celeste. Lurene Shadow, M.D. Attending Physician:  Sonda Primes DD:  05/02/02 TD:  05/06/02 Job: [id number]

## 2011-05-18 ENCOUNTER — Other Ambulatory Visit: Payer: Self-pay | Admitting: Obstetrics & Gynecology

## 2011-07-15 LAB — CBC
MCV: 91.2
RBC: 4.12
WBC: 6.4

## 2011-08-12 ENCOUNTER — Encounter: Payer: Self-pay | Admitting: Internal Medicine

## 2011-08-12 ENCOUNTER — Other Ambulatory Visit: Payer: Self-pay | Admitting: Internal Medicine

## 2011-08-12 NOTE — Progress Notes (Signed)
Patient changed GI care to Dr Jena Gauss. He saw Dr Luvenia Starch office on 01/18/11. Our system has been updated.

## 2011-10-17 ENCOUNTER — Other Ambulatory Visit: Payer: Self-pay

## 2011-10-17 ENCOUNTER — Other Ambulatory Visit: Payer: Self-pay | Admitting: Internal Medicine

## 2011-10-17 MED ORDER — PANTOPRAZOLE SODIUM 40 MG PO TBEC: 40.0000 mg | DELAYED_RELEASE_TABLET | Freq: Two times a day (BID) | ORAL | Status: DC

## 2011-11-09 ENCOUNTER — Encounter (HOSPITAL_COMMUNITY): Payer: Self-pay | Admitting: *Deleted

## 2011-11-09 ENCOUNTER — Emergency Department (HOSPITAL_COMMUNITY)
Admission: EM | Admit: 2011-11-09 | Discharge: 2011-11-09 | Disposition: A | Discharge status: Home / Self Care | Payer: 59 | Attending: Emergency Medicine | Admitting: Emergency Medicine

## 2011-11-09 DIAGNOSIS — K219 Gastro-esophageal reflux disease without esophagitis: Secondary | ICD-10-CM | POA: Insufficient documentation

## 2011-11-09 DIAGNOSIS — Z79899 Other long term (current) drug therapy: Secondary | ICD-10-CM | POA: Insufficient documentation

## 2011-11-09 DIAGNOSIS — M79609 Pain in unspecified limb: Secondary | ICD-10-CM | POA: Insufficient documentation

## 2011-11-09 DIAGNOSIS — F3289 Other specified depressive episodes: Secondary | ICD-10-CM | POA: Insufficient documentation

## 2011-11-09 DIAGNOSIS — M79642 Pain in left hand: Secondary | ICD-10-CM

## 2011-11-09 DIAGNOSIS — M79639 Pain in unspecified forearm: Secondary | ICD-10-CM

## 2011-11-09 DIAGNOSIS — M25539 Pain in unspecified wrist: Secondary | ICD-10-CM | POA: Insufficient documentation

## 2011-11-09 DIAGNOSIS — F329 Major depressive disorder, single episode, unspecified: Secondary | ICD-10-CM | POA: Insufficient documentation

## 2011-11-09 DIAGNOSIS — M25519 Pain in unspecified shoulder: Secondary | ICD-10-CM | POA: Insufficient documentation

## 2011-11-09 DIAGNOSIS — M129 Arthropathy, unspecified: Secondary | ICD-10-CM | POA: Insufficient documentation

## 2011-11-09 MED ORDER — HYDROCODONE-ACETAMINOPHEN 5-325 MG PO TABS: 1.0000 | ORAL_TABLET | ORAL | Status: AC | PRN

## 2011-11-09 NOTE — ED Notes (Signed)
Pt reporting pain in left shoulder, increasing in severity.  Reports pain moving down arm into hand.

## 2011-11-09 NOTE — ED Provider Notes (Signed)
This chart was scribed for No att. providers found by Williemae Natter. The patient was seen in room APA09/APA09 at 7:13 AM.  CSN: [id number]  Arrival date & time 11/09/11  4098   First MD Initiated Contact with Patient 11/09/11 [phone]      Chief Complaint  Patient presents with  . Shoulder Pain    (Consider location/radiation/quality/duration/timing/severity/associated sxs/prior treatment) HPI [name] is a 51 y.o. female who presents to the Emergency Department complaining of mild to severe insidious onset left forearm and hand pain since Monday. Pt was helping her son move two days ago right before onset but no specific known injury. Pt states that she has constant shooting pain down the affected arm since 12:30 last night and can hardly move her arm now. Pt denies any neck or chest pain. Pain is touch and movement related. Pt treated pain with 2 extra-strength Tylenol with no improvement. She reports that attempted to go bowling yesterday as usual but could hardly able to pick up the ball due to the pain.  Past Medical History  Diagnosis Date  . Anal fistula     hx of   . Hiatal hernia   . GERD (gastroesophageal reflux disease)   . Abdominal pain of unknown etiology   . Arthritis   . Depression   . Constipation     severe  . S/P endoscopy April 2011    Dr. Juanda Chance: no hiatal hernia, generous opening to antrum., patent gastrojejunostomy  . S/P colonoscopy April 2011    Dr. Juanda Chance: mild diverticulosis, hyperplastic polyps, repeat in 10 years    Past Surgical History  Procedure Date  . Anal fistulostomy     w/marsupialization an excision of anal tags  . Gastric bypass     mini gastric bypass in [employer] reportedly  . Breast lumpectomy     benign    Family History  Problem Relation Age of Onset  . COPD Mother     living  . Heart disease Father     deceased  . Colon cancer Neg Hx     History  Substance Use Topics  . Smoking status: Former Games developer  .  Smokeless tobacco: Never Used   Comment: quit 12 years ago  . Alcohol Use: 0.5 oz/week    1 drink(s) per week     social drinker    OB History    Grav Para Term Preterm Abortions TAB SAB Ect Mult Living                  Review of Systems 10 Systems reviewed and are negative for acute change except as noted in the HPI.  Allergies  Penicillins  Home Medications   Current Outpatient Rx  Name Route Sig Dispense Refill  . CASANTHRANOL-DOCUSATE SODIUM 30-100 MG PO CAPS Oral Take by mouth.      Marland Kitchen VITAMIN D3 1000 UNITS PO TABS Oral Take 1,000 Units by mouth daily.     . OMEGA-3 FATTY ACIDS 1000 MG PO CAPS Oral Take 1 g by mouth daily.      Marland Kitchen GLUCOSAMINE-CHONDROITIN-VIT C 2000-1200-60 MG/30ML PO LIQD Oral Take 1 tablet by mouth 3 (three) times a week.      Marland Kitchen LEVOTHYROXINE SODIUM 50 MCG PO TABS Oral Take 50 mcg by mouth daily.      Marland Kitchen BIOTIN 5 MG PO CAPS Oral Take 5 mg by mouth 1 dose over 46 hours.      Marland Kitchen CALCIUM 600 PO Oral Take  1 tablet by mouth daily.      Marland Kitchen HYDROCODONE-ACETAMINOPHEN 5-325 MG PO TABS Oral Take 1 tablet by mouth every 4 (four) hours as needed for pain. 20 tablet 0  . METOCLOPRAMIDE HCL 5 MG PO TABS Oral Take 5 mg by mouth at bedtime.      Marland Kitchen PANTOPRAZOLE SODIUM 40 MG PO TBEC Oral Take 1 tablet (40 mg total) by mouth 2 (two) times daily. 60 tablet 5  Pulse oximetry on room air is 100%. Normal by my interpretation.   BP 117/70  Pulse 65  Temp(Src) 98.5 F (36.9 C) (Oral)  Resp 18  Ht 5\' 5"  (1.651 m)  Wt 180 lb (81.647 kg)  BMI 29.95 kg/m2  SpO2 100%  Physical Exam  Nursing note and vitals reviewed. Constitutional: She is oriented to person, place, and time. She appears well-developed and well-nourished.  HENT:  Head: Normocephalic and atraumatic.  Eyes: Conjunctivae are normal.  Neck: Normal range of motion. Neck supple.  Pulmonary/Chest: Effort normal. No respiratory distress.  Musculoskeletal:       Left forearm is diffusely tender without deformity.  Left wrist has decreased range of motion secondary to pain. Active movement of fingers causes pain. No deformity of the left hand.   Neurological: She is alert and oriented to person, place, and time. No cranial nerve deficit. She exhibits normal muscle tone. Coordination normal.  Skin: Skin is warm and dry. No rash noted.  Psychiatric: Her behavior is normal. Thought content normal.       Anxious     ED Course  Procedures (including critical care time)  Labs Reviewed - No data to display No results found.   1. Forearm pain   2. Hand pain, left      MDM  7:26 AM Treatment: Removable splint and sling to the left arm. Nonspecific left forearm and hand pain. Doubt carpal tunnel syndrome, fracture, nerve impingement, chest source, such as cardiac or pulmonary or  spine conditions.    I personally performed the services described in this documentation, which was scribed in my presence. The recorded information has been reviewed and considered.       Flint Melter, MD 11/09/11 [phone]

## 2012-04-30 ENCOUNTER — Ambulatory Visit (HOSPITAL_COMMUNITY)
Admission: RE | Admit: 2012-04-30 | Discharge: 2012-04-30 | Disposition: A | Discharge status: Home / Self Care | Payer: 59 | Source: Ambulatory Visit | Attending: Family Medicine | Admitting: Family Medicine

## 2012-04-30 ENCOUNTER — Other Ambulatory Visit: Payer: Self-pay | Admitting: Family Medicine

## 2012-04-30 DIAGNOSIS — R05 Cough: Secondary | ICD-10-CM

## 2012-04-30 DIAGNOSIS — R059 Cough, unspecified: Secondary | ICD-10-CM | POA: Insufficient documentation

## 2012-04-30 DIAGNOSIS — K219 Gastro-esophageal reflux disease without esophagitis: Secondary | ICD-10-CM | POA: Insufficient documentation

## 2012-09-20 ENCOUNTER — Other Ambulatory Visit: Payer: Self-pay | Admitting: Gastroenterology

## 2012-09-20 NOTE — Telephone Encounter (Signed)
RF X 2. Needs f/u appt to determine if still needs pantoprazole BID.

## 2012-09-21 ENCOUNTER — Encounter: Payer: Self-pay | Admitting: Gastroenterology

## 2012-09-21 NOTE — Telephone Encounter (Signed)
Mailed letter to patient for her to call our office to set up OV to further her refills

## 2012-11-05 ENCOUNTER — Ambulatory Visit (HOSPITAL_COMMUNITY)
Admission: RE | Admit: 2012-11-05 | Discharge: 2012-11-05 | Disposition: A | Discharge status: Home / Self Care | Payer: 59 | Source: Ambulatory Visit | Attending: Family Medicine | Admitting: Family Medicine

## 2012-11-05 ENCOUNTER — Other Ambulatory Visit: Payer: Self-pay | Admitting: Family Medicine

## 2012-11-05 DIAGNOSIS — M25559 Pain in unspecified hip: Secondary | ICD-10-CM | POA: Insufficient documentation

## 2012-11-05 DIAGNOSIS — M25551 Pain in right hip: Secondary | ICD-10-CM

## 2012-11-18 ENCOUNTER — Other Ambulatory Visit: Payer: Self-pay | Admitting: Gastroenterology

## 2013-01-29 ENCOUNTER — Other Ambulatory Visit (HOSPITAL_COMMUNITY): Payer: Self-pay | Admitting: Family Medicine

## 2013-02-15 ENCOUNTER — Other Ambulatory Visit: Payer: Self-pay | Admitting: Family Medicine

## 2013-04-02 ENCOUNTER — Encounter: Payer: Self-pay | Admitting: *Deleted

## 2013-06-13 ENCOUNTER — Other Ambulatory Visit: Payer: Self-pay | Admitting: Family Medicine

## 2013-07-11 ENCOUNTER — Encounter: Payer: Self-pay | Admitting: Family Medicine

## 2013-07-11 ENCOUNTER — Other Ambulatory Visit: Payer: Self-pay | Admitting: Family Medicine

## 2013-07-11 ENCOUNTER — Ambulatory Visit (INDEPENDENT_AMBULATORY_CARE_PROVIDER_SITE_OTHER): Payer: 59 | Admitting: Family Medicine

## 2013-07-11 VITALS — BP 112/78 | Temp 98.9°F | Ht 64.0 in | Wt 193.4 lb

## 2013-07-11 DIAGNOSIS — R109 Unspecified abdominal pain: Secondary | ICD-10-CM

## 2013-07-11 DIAGNOSIS — M069 Rheumatoid arthritis, unspecified: Secondary | ICD-10-CM

## 2013-07-11 LAB — HEPATIC FUNCTION PANEL
AST: 31 U/L (ref 0–37)
Alkaline Phosphatase: 60 U/L (ref 39–117)
Bilirubin, Direct: 0.1 mg/dL (ref 0.0–0.3)
Indirect Bilirubin: 0.2 mg/dL (ref 0.0–0.9)
Total Bilirubin: 0.3 mg/dL (ref 0.3–1.2)

## 2013-07-11 LAB — BASIC METABOLIC PANEL
Glucose, Bld: 121 mg/dL — ABNORMAL HIGH (ref 70–99)
Potassium: 3.8 mEq/L (ref 3.5–5.3)
Sodium: 139 mEq/L (ref 135–145)

## 2013-07-11 LAB — CBC
Hemoglobin: 13 g/dL (ref 12.0–15.0)
MCHC: 33.5 g/dL (ref 30.0–36.0)
Platelets: 223 10*3/uL (ref 150–400)
RDW: 12.8 % (ref 11.5–15.5)

## 2013-07-11 MED ORDER — HYDROCODONE-ACETAMINOPHEN 5-325 MG PO TABS: 1.0000 | ORAL_TABLET | Freq: Four times a day (QID) | ORAL | Status: DC | PRN

## 2013-07-11 MED ORDER — ONDANSETRON HCL 8 MG PO TABS: 8.0000 mg | ORAL_TABLET | Freq: Three times a day (TID) | ORAL | Status: DC | PRN

## 2013-07-11 NOTE — Progress Notes (Signed)
  Subjective:    Patient ID: [name], female    DOB: 1960/10/25, 52 y.o.   MRN: [id number]  Abdominal Pain This is a new problem. The current episode started today. The onset quality is sudden. The pain is located in the epigastric region. The quality of the pain is dull. Associated symptoms include belching, nausea and vomiting. The pain is relieved by belching and vomiting.  belching, nauseous, no burning Feels cold (  Normal for pt) No dysuria, no diarrhea. No fevers Felt fine last pm, she is on acid blocking medicine 1 per day She has had gastric bypass before. She denies vomiting blood or having blood in her stools denies fever chills sweats. Dx- rhematoid arthritis she was diagnosed rheumatoid arthritis by the rheumatologist Dr. Dierdre Forth she does not want to take any type of medicine for this other than anti-inflammatories    Review of Systems  Gastrointestinal: Positive for nausea, vomiting and abdominal pain.   patient doesn't smoke currently     Objective:   Physical Exam Neck no masses lungs are clear no crackles heart is regular abdomen is soft with mild epigastric tenderness no guarding or rebound. Patient does not appear toxic she also has some subjective discomfort in her hands       Assessment & Plan:  Epigastric pain history gastric bypass possible anastomosis erosions increase PPI, take 2 daily Will check lab work to make sure there is not underlying problem with pancreas or liver if symptoms worsen over the next few days or if not improving by early next week referral to gastroenterology and ultrasound of carotids.

## 2013-07-21 ENCOUNTER — Other Ambulatory Visit: Payer: Self-pay | Admitting: Family Medicine

## 2013-07-24 ENCOUNTER — Other Ambulatory Visit: Payer: Self-pay | Admitting: Family Medicine

## 2013-08-18 ENCOUNTER — Other Ambulatory Visit: Payer: Self-pay | Admitting: Family Medicine

## 2013-08-22 ENCOUNTER — Other Ambulatory Visit: Payer: Self-pay

## 2013-11-11 ENCOUNTER — Other Ambulatory Visit: Payer: Self-pay | Admitting: Family Medicine

## 2013-11-24 ENCOUNTER — Other Ambulatory Visit: Payer: Self-pay | Admitting: Urgent Care

## 2013-11-25 NOTE — Telephone Encounter (Signed)
One refill only. Needs to get from PCP or OV here. Last seen 01/2011.

## 2014-01-07 ENCOUNTER — Encounter: Payer: Self-pay | Admitting: Family Medicine

## 2014-01-07 ENCOUNTER — Ambulatory Visit (INDEPENDENT_AMBULATORY_CARE_PROVIDER_SITE_OTHER): Payer: 59 | Admitting: Family Medicine

## 2014-01-07 VITALS — BP 114/74 | Temp 98.5°F | Ht 64.0 in | Wt 194.5 lb

## 2014-01-07 DIAGNOSIS — E0590 Thyrotoxicosis, unspecified without thyrotoxic crisis or storm: Secondary | ICD-10-CM

## 2014-01-07 DIAGNOSIS — R350 Frequency of micturition: Secondary | ICD-10-CM

## 2014-01-07 LAB — POCT URINALYSIS DIPSTICK
Blood, UA: 250
NITRITE UA: POSITIVE
PH UA: 8
Spec Grav, UA: <=1.005

## 2014-01-07 MED ORDER — CIPROFLOXACIN HCL 500 MG PO TABS: 500.0000 mg | ORAL_TABLET | Freq: Two times a day (BID) | ORAL | Status: AC

## 2014-01-07 NOTE — Progress Notes (Signed)
   Subjective:    Patient ID: [name], female    DOB: [birth year], 53 y.o.   MRN: [id number]  Urinary Tract Infection  This is a new problem. The current episode started yesterday. The problem occurs every urination. The problem has been unchanged. The pain is moderate. There has been no fever. Associated symptoms include urgency. Associated symptoms comments: Pressure, diarrhea. She has tried nothing for the symptoms. The treatment provided no relief.   Has history hypothyroidism   Review of Systems  Genitourinary: Positive for urgency.   Denies chest pain shortness breath abdominal pain relates urgency and dysuria denies hematuria    Objective:   Physical Exam Flanks nontender lungs clear hearts regular pulse normal BP good       Assessment & Plan:  Urinary tract infection antibiotics prescribed urine culture ordered. Await the results. Warning signs discussed. Hypothyroidism check TSH continue current medication

## 2014-01-08 LAB — TSH: TSH: 2.397 u[IU]/mL (ref 0.350–4.500)

## 2014-01-10 LAB — URINE CULTURE: Colony Count: >=100000

## 2014-01-28 ENCOUNTER — Other Ambulatory Visit: Payer: Self-pay | Admitting: Family Medicine

## 2014-02-10 ENCOUNTER — Other Ambulatory Visit: Payer: Self-pay | Admitting: Family Medicine

## 2014-03-07 ENCOUNTER — Telehealth: Payer: Self-pay | Admitting: Family Medicine

## 2014-03-07 MED ORDER — PANTOPRAZOLE SODIUM 40 MG PO TBEC: DELAYED_RELEASE_TABLET | ORAL | Status: DC

## 2014-03-07 NOTE — Telephone Encounter (Signed)
Patient needs Rx for pantoprazole (PROTONIX) 40 MG tablet    [facility]

## 2014-03-07 NOTE — Telephone Encounter (Signed)
Patient notified medication refills sent to pharmacy

## 2014-03-11 ENCOUNTER — Telehealth: Payer: Self-pay | Admitting: Family Medicine

## 2014-03-11 NOTE — Telephone Encounter (Signed)
Patient needs Rx for levothyroxine to [facility].

## 2014-03-12 MED ORDER — LEVOTHYROXINE SODIUM 88 MCG PO TABS: ORAL_TABLET | ORAL | Status: DC

## 2014-03-14 NOTE — Telephone Encounter (Signed)
Med sent in.

## 2014-03-19 ENCOUNTER — Encounter: Payer: Self-pay | Admitting: Nurse Practitioner

## 2014-03-19 ENCOUNTER — Ambulatory Visit (INDEPENDENT_AMBULATORY_CARE_PROVIDER_SITE_OTHER): Payer: 59 | Admitting: Nurse Practitioner

## 2014-03-19 ENCOUNTER — Ambulatory Visit (HOSPITAL_COMMUNITY)
Admission: RE | Admit: 2014-03-19 | Discharge: 2014-03-19 | Disposition: A | Discharge status: Home / Self Care | Payer: 59 | Source: Ambulatory Visit | Attending: Nurse Practitioner | Admitting: Nurse Practitioner

## 2014-03-19 VITALS — BP 132/70 | Ht 64.0 in | Wt 198.8 lb

## 2014-03-19 DIAGNOSIS — M25511 Pain in right shoulder: Secondary | ICD-10-CM

## 2014-03-19 DIAGNOSIS — M7021 Olecranon bursitis, right elbow: Secondary | ICD-10-CM

## 2014-03-19 DIAGNOSIS — M25519 Pain in unspecified shoulder: Secondary | ICD-10-CM

## 2014-03-19 DIAGNOSIS — M7020 Olecranon bursitis, unspecified elbow: Secondary | ICD-10-CM

## 2014-03-19 MED ORDER — DICLOFENAC SODIUM 75 MG PO TBEC: 75.0000 mg | DELAYED_RELEASE_TABLET | Freq: Two times a day (BID) | ORAL | Status: DC

## 2014-03-19 MED ORDER — METHOCARBAMOL 750 MG PO TABS: 750.0000 mg | ORAL_TABLET | Freq: Three times a day (TID) | ORAL | Status: DC | PRN

## 2014-03-19 MED ORDER — HYDROCODONE-ACETAMINOPHEN 5-325 MG PO TABS: 1.0000 | ORAL_TABLET | Freq: Four times a day (QID) | ORAL | Status: DC | PRN

## 2014-03-20 ENCOUNTER — Encounter: Payer: Self-pay | Admitting: Nurse Practitioner

## 2014-03-20 NOTE — Progress Notes (Signed)
Subjective:  Presents with complaints of right shoulder pain over the past month. No specific history of injury. Worse with certain movements. Has a history of rheumatoid arthritis, deferred immune modifiers. Has not had a flareup in a long time. No fever. Began having fluid of the right elbow about a week and a half ago, nontender. No other significant joint involvement.  Objective:   BP 132/70  Ht 5\' 4"  (1.626 m)  Wt 198 lb 12.8 oz (90.175 kg)  BMI 34.11 kg/m2 NAD. Alert, oriented. Lungs clear. Heart regular. Can perform active ROM of the right shoulder but very slowly and with difficulty with full rotation above shoulder joint line. Generalized joint line tenderness. Hand and arm strength 5+ bilateral. Radial pulses strong. Very tight tender muscles noted medial to the upper right scapula. Significant localized edema on the posterior right elbow, no erythema warmth or tenderness.  Assessment: Right shoulder pain - Plan: DG Shoulder Right  Olecranon bursitis of right elbow  Plan: Meds ordered this encounter  Medications  . Calcium Carbonate Antacid (TUMS PO)    Sig: Take by mouth. Take 5 every day  . diclofenac (VOLTAREN) 75 MG EC tablet    Sig: Take 1 tablet (75 mg total) by mouth 2 (two) times daily. Prn pain    Dispense:  60 tablet    Refill:  0    Order Specific Question:  Supervising Provider    Answer:  Mikey Kirschner [[id number]]  . HYDROcodone-acetaminophen (NORCO/VICODIN) 5-325 MG per tablet    Sig: Take 1 tablet by mouth every 6 (six) hours as needed.    Dispense:  30 tablet    Refill:  0    Order Specific Question:  Supervising Provider    Answer:  Mikey Kirschner [[id number]]  . methocarbamol (ROBAXIN) 750 MG tablet    Sig: Take 1 tablet (750 mg total) by mouth every 8 (eight) hours as needed for muscle spasms.    Dispense:  30 tablet    Refill:  0    Please dispense name brand if lower tier    Order Specific Question:  Supervising Provider    Answer:  Maggie Font   Refer to orthopedic specialist for further evaluation and treatment. Call back sooner if symptoms worsen. Drowsiness precautions with hydrocodone. Ice/heat applications to upper back/neck area. Stretching exercises. Massage therapy.

## 2014-03-21 ENCOUNTER — Other Ambulatory Visit: Payer: Self-pay

## 2014-03-21 DIAGNOSIS — M25511 Pain in right shoulder: Secondary | ICD-10-CM

## 2014-05-20 ENCOUNTER — Encounter: Payer: Self-pay | Admitting: Family Medicine

## 2014-05-20 ENCOUNTER — Ambulatory Visit (INDEPENDENT_AMBULATORY_CARE_PROVIDER_SITE_OTHER): Payer: 59 | Admitting: Family Medicine

## 2014-05-20 VITALS — BP 124/70 | Temp 98.3°F | Ht 64.0 in | Wt 199.0 lb

## 2014-05-20 DIAGNOSIS — D509 Iron deficiency anemia, unspecified: Secondary | ICD-10-CM

## 2014-05-20 DIAGNOSIS — E0590 Thyrotoxicosis, unspecified without thyrotoxic crisis or storm: Secondary | ICD-10-CM

## 2014-05-20 DIAGNOSIS — M25519 Pain in unspecified shoulder: Secondary | ICD-10-CM

## 2014-05-20 DIAGNOSIS — M25512 Pain in left shoulder: Secondary | ICD-10-CM

## 2014-05-20 DIAGNOSIS — R5381 Other malaise: Secondary | ICD-10-CM

## 2014-05-20 DIAGNOSIS — R5383 Other fatigue: Secondary | ICD-10-CM

## 2014-05-20 DIAGNOSIS — E039 Hypothyroidism, unspecified: Secondary | ICD-10-CM | POA: Insufficient documentation

## 2014-05-20 DIAGNOSIS — M25511 Pain in right shoulder: Secondary | ICD-10-CM

## 2014-05-20 LAB — CBC WITH DIFFERENTIAL/PLATELET
Basophils Absolute: 0.1 10*3/uL (ref 0.0–0.1)
Basophils Relative: 1 % (ref 0–1)
EOS ABS: 0.2 10*3/uL (ref 0.0–0.7)
Eosinophils Relative: 3 % (ref 0–5)
HEMATOCRIT: 38.3 % (ref 36.0–46.0)
HEMOGLOBIN: 12.7 g/dL (ref 12.0–15.0)
LYMPHS ABS: 2.1 10*3/uL (ref 0.7–4.0)
Lymphocytes Relative: 37 % (ref 12–46)
MCH: 30.2 pg (ref 26.0–34.0)
MCHC: 33.2 g/dL (ref 30.0–36.0)
MCV: 91 fL (ref 78.0–100.0)
MONOS PCT: 7 % (ref 3–12)
Monocytes Absolute: 0.4 10*3/uL (ref 0.1–1.0)
NEUTROS PCT: 52 % (ref 43–77)
Neutro Abs: 3 10*3/uL (ref 1.7–7.7)
Platelets: 257 10*3/uL (ref 150–400)
RBC: 4.21 MIL/uL (ref 3.87–5.11)
RDW: 13.6 % (ref 11.5–15.5)
WBC: 5.8 10*3/uL (ref 4.0–10.5)

## 2014-05-20 LAB — IRON AND TIBC
%SAT: 30 % (ref 20–55)
IRON: 90 ug/dL (ref 42–145)
TIBC: 296 ug/dL (ref 250–470)
UIBC: 206 ug/dL (ref 125–400)

## 2014-05-20 MED ORDER — ZOLPIDEM TARTRATE 5 MG PO TABS: 5.0000 mg | ORAL_TABLET | Freq: Every evening | ORAL | Status: DC | PRN

## 2014-05-20 NOTE — Progress Notes (Signed)
   Subjective:    Patient ID: [name], female    DOB: [birth year], 53 y.o.   MRN: [id number]  Arm Pain  The incident occurred more than 1 week ago. There was no injury mechanism. Pain location: right and left arm. The quality of the pain is described as aching. The pain does not radiate. The pain is moderate. The pain has been intermittent since the incident. Nothing aggravates the symptoms. She has tried NSAIDs for the symptoms. The treatment provided no relief.   This patient states that she had lab testing done about a year and a half ago and was told she had rheumatoid arthritis. She did not want to go on any medication if she truly did not feel confident than what was told to her. She relates she has a lot of shoulder pain and discomfort she seen an orthopedist coming up. In addition to this she does have available pain and hand pain and joint pain in the hands.  Patient states that she has been experiencing insomnia and stress for about 1-2 weeks now. There is been some issues and some relatives that is causing significant stress at home causes the patient to have a lot of anxiety and worry and not sleeping well she feels that she can do some help her sleep she would feel better she denies being depressed currently   Review of Systems He denies cough vomiting weight loss fever chills rashes    Objective:   Physical Exam Lungs clear hearts regular pulse normal subjective discomfort in both shoulders with some crepitus she does have some arthritic changes in the hands but no redness of the joints and lower legs are normal       Assessment & Plan:  Joint pain-I. am concerned this patient truly does have rheumatoid. After a long discussion about the importance of early treatment for rheumatoid the patient would like to get a second opinion for which I support her for doing so. We will set her up with rheumatology.  Ulyses Jarred is having significant stress and because of this having  insomnia I recommend Ambien 5 mg not for long-term use patient agrees understands the importance of developing a whole night sleep. In addition to this to followup if any progressive troubles warning signs for depression discuss it she has add she needs to followup  FMLA acute and chronic, she needs to be taken out of work for at least the next week. Certainly this could be expanded to the next couple weeks if need be. She also needs FMLA tumor flat that she may need to stay out intermittently because of flareup of her joints

## 2014-05-21 LAB — VITAMIN D 25 HYDROXY (VIT D DEFICIENCY, FRACTURES): Vit D, 25-Hydroxy: 46 ng/mL (ref 30–89)

## 2014-05-21 LAB — FERRITIN: Ferritin: 127 ng/mL (ref 10–291)

## 2014-05-21 LAB — VITAMIN B12: Vitamin B-12: 739 pg/mL (ref 211–911)

## 2014-05-21 LAB — TSH: TSH: 3.691 u[IU]/mL (ref 0.350–4.500)

## 2014-05-26 ENCOUNTER — Telehealth: Payer: Self-pay | Admitting: Family Medicine

## 2014-05-26 ENCOUNTER — Encounter: Payer: Self-pay | Admitting: Family Medicine

## 2014-05-26 NOTE — Telephone Encounter (Signed)
Please give work excuse through end of week. Please call pt when ready for pickup.

## 2014-05-26 NOTE — Telephone Encounter (Signed)
Notified patient.

## 2014-05-26 NOTE — Telephone Encounter (Signed)
Patient states she has still not heard back from her insurance company yet and needs a work excuse covering her until Friday due to her waiting on them.

## 2014-05-26 NOTE — Telephone Encounter (Signed)
Do joint pain for him rheumatoid will give work excuse through the end of this week, patient unable to do her job currently

## 2014-05-26 NOTE — Telephone Encounter (Signed)
Patient said that she was supposed to call back and talk to Dr. Nicki Reaper about additional days that she needed to be wrote out of work.

## 2014-06-03 ENCOUNTER — Encounter: Payer: Self-pay | Admitting: Family Medicine

## 2014-06-03 ENCOUNTER — Telehealth: Payer: Self-pay | Admitting: Family Medicine

## 2014-06-03 NOTE — Telephone Encounter (Signed)
Results discussed with patient. Rheumatology doctor requires a referral from doctor and patient advised we are currently working on appt.

## 2014-06-03 NOTE — Telephone Encounter (Signed)
Patient calling to get results to labwork. Also, would like name of rheumatoid arthritis doctor so that she can skip referral process.

## 2014-08-04 ENCOUNTER — Other Ambulatory Visit: Payer: Self-pay | Admitting: Family Medicine

## 2014-09-06 ENCOUNTER — Other Ambulatory Visit: Payer: Self-pay | Admitting: Family Medicine

## 2014-11-10 ENCOUNTER — Ambulatory Visit (INDEPENDENT_AMBULATORY_CARE_PROVIDER_SITE_OTHER): Payer: 59 | Admitting: Family Medicine

## 2014-11-10 ENCOUNTER — Encounter: Payer: Self-pay | Admitting: Family Medicine

## 2014-11-10 ENCOUNTER — Other Ambulatory Visit: Payer: Self-pay | Admitting: Family Medicine

## 2014-11-10 VITALS — BP 118/70 | Temp 98.9°F | Ht 64.0 in | Wt 192.0 lb

## 2014-11-10 DIAGNOSIS — B9689 Other specified bacterial agents as the cause of diseases classified elsewhere: Secondary | ICD-10-CM

## 2014-11-10 DIAGNOSIS — J019 Acute sinusitis, unspecified: Secondary | ICD-10-CM

## 2014-11-10 MED ORDER — LEVOFLOXACIN 500 MG PO TABS: 500.0000 mg | ORAL_TABLET | Freq: Every day | ORAL | Status: DC

## 2014-11-10 NOTE — Progress Notes (Signed)
   Subjective:    Patient ID: [name], female    DOB: 10/13/1961, 54 y.o.   MRN: [id number]  Sinusitis This is a new problem. The current episode started yesterday. Associated symptoms include congestion, headaches and sneezing. Pertinent negatives include no coughing, ear pain or shortness of breath. (Runny nose, watery eye, dental pain) Past treatments include nothing.   Started yesterday Had shrimp and scallops Eyes watering, nasal runny Teeth aching   Review of Systems  Constitutional: Negative for fever and activity change.  HENT: Positive for congestion, rhinorrhea and sneezing. Negative for ear pain.   Eyes: Negative for discharge.  Respiratory: Negative for cough, shortness of breath and wheezing.   Cardiovascular: Negative for chest pain.  Neurological: Positive for headaches.       Objective:   Physical Exam  Constitutional: She appears well-developed.  HENT:  Head: Normocephalic.  Nose: Nose normal.  Mouth/Throat: Oropharynx is clear and moist. No oropharyngeal exudate.  Neck: Neck supple.  Cardiovascular: Normal rate and normal heart sounds.   No murmur heard. Pulmonary/Chest: Effort normal and breath sounds normal. She has no wheezes.  Lymphadenopathy:    She has no cervical adenopathy.  Skin: Skin is warm and dry.  Nursing note and vitals reviewed.         Assessment & Plan:  #1 sinusitis antibiotics prescribed warning signs discussed follow-up if ongoing trouble  #2 obesity patient is trying to lose weight through diet. She was encouraged to also do physical activity and exercise.

## 2015-02-28 ENCOUNTER — Other Ambulatory Visit: Payer: Self-pay | Admitting: Family Medicine

## 2015-04-24 ENCOUNTER — Other Ambulatory Visit: Payer: Self-pay | Admitting: Family Medicine

## 2015-04-27 NOTE — Telephone Encounter (Signed)
Needs office visit.

## 2015-07-05 ENCOUNTER — Other Ambulatory Visit: Payer: Self-pay | Admitting: Family Medicine

## 2015-08-18 ENCOUNTER — Telehealth: Payer: Self-pay | Admitting: Family Medicine

## 2015-08-18 ENCOUNTER — Other Ambulatory Visit: Payer: Self-pay | Admitting: *Deleted

## 2015-08-18 MED ORDER — INTEGRA PLUS PO CAPS: ORAL_CAPSULE | ORAL | Status: DC

## 2015-08-18 NOTE — Telephone Encounter (Signed)
Med sent to pharm. Pt notified on vm.

## 2015-08-18 NOTE — Telephone Encounter (Signed)
Patient had requested Rx for FeFum-FePoly-FA-B Cmp-C-Biot (INTEGRA PLUS) CAPS, but this was denied because she needs an OV.  She says she is going out of town tomorrow and wants to know would it be okay for her to go without this medication or can we give her Rx?   [employer]

## 2015-08-18 NOTE — Telephone Encounter (Signed)
May send in rx 30 day and 1 refill

## 2015-08-27 ENCOUNTER — Telehealth: Payer: Self-pay | Admitting: Family Medicine

## 2015-08-27 DIAGNOSIS — R5383 Other fatigue: Secondary | ICD-10-CM

## 2015-08-27 DIAGNOSIS — Z79899 Other long term (current) drug therapy: Secondary | ICD-10-CM

## 2015-08-27 DIAGNOSIS — D649 Anemia, unspecified: Secondary | ICD-10-CM

## 2015-08-27 DIAGNOSIS — E039 Hypothyroidism, unspecified: Secondary | ICD-10-CM

## 2015-08-27 DIAGNOSIS — Z13220 Encounter for screening for lipoid disorders: Secondary | ICD-10-CM

## 2015-08-27 NOTE — Telephone Encounter (Signed)
Pt is requesting lab orders to be sent over. Last labs per epic were: vit d,cbc,tsh,b12,ferritin,and iron binding cap on 05/20/14

## 2015-08-27 NOTE — Telephone Encounter (Signed)
All of these and live met 7 lipid

## 2015-08-27 NOTE — Telephone Encounter (Signed)
[facility] 08/27/15

## 2015-08-28 NOTE — Telephone Encounter (Signed)
Patient was notified.

## 2015-08-29 ENCOUNTER — Other Ambulatory Visit: Payer: Self-pay | Admitting: Family Medicine

## 2015-08-31 LAB — BASIC METABOLIC PANEL
BUN/Creatinine Ratio: 13 (ref 9–23)
BUN: 10 mg/dL (ref 6–24)
CALCIUM: 9 mg/dL (ref 8.7–10.2)
CHLORIDE: 104 mmol/L (ref 97–106)
CO2: 25 mmol/L (ref 18–29)
Creatinine, Ser: 0.77 mg/dL (ref 0.57–1.00)
GFR calc non Af Amer: 88 mL/min/{1.73_m2} (ref >59)
GFR, EST AFRICAN AMERICAN: 102 mL/min/{1.73_m2} (ref >59)
Glucose: 89 mg/dL (ref 65–99)
POTASSIUM: 4.5 mmol/L (ref 3.5–5.2)
Sodium: 143 mmol/L (ref 136–144)

## 2015-08-31 LAB — FERRITIN: Ferritin: 195 ng/mL — ABNORMAL HIGH (ref 15–150)

## 2015-08-31 LAB — HEPATIC FUNCTION PANEL
ALT: 43 IU/L — AB (ref 0–32)
AST: 35 IU/L (ref 0–40)
Albumin: 3.9 g/dL (ref 3.5–5.5)
Alkaline Phosphatase: 69 IU/L (ref 39–117)
BILIRUBIN TOTAL: 0.2 mg/dL (ref 0.0–1.2)
Bilirubin, Direct: 0.1 mg/dL (ref 0.00–0.40)
Total Protein: 6 g/dL (ref 6.0–8.5)

## 2015-08-31 LAB — CBC WITH DIFFERENTIAL/PLATELET
BASOS ABS: 0.1 10*3/uL (ref 0.0–0.2)
Basos: 2 %
EOS (ABSOLUTE): 0.1 10*3/uL (ref 0.0–0.4)
Eos: 3 %
Hematocrit: 38.4 % (ref 34.0–46.6)
Hemoglobin: 12.5 g/dL (ref 11.1–15.9)
Immature Grans (Abs): 0 10*3/uL (ref 0.0–0.1)
Immature Granulocytes: 0 %
LYMPHS ABS: 1.7 10*3/uL (ref 0.7–3.1)
Lymphs: 36 %
MCH: 30.9 pg (ref 26.6–33.0)
MCHC: 32.6 g/dL (ref 31.5–35.7)
MCV: 95 fL (ref 79–97)
Monocytes Absolute: 0.4 10*3/uL (ref 0.1–0.9)
Monocytes: 8 %
NEUTROS ABS: 2.4 10*3/uL (ref 1.4–7.0)
Neutrophils: 51 %
PLATELETS: 232 10*3/uL (ref 150–379)
RBC: 4.04 x10E6/uL (ref 3.77–5.28)
RDW: 14.9 % (ref 12.3–15.4)
WBC: 4.6 10*3/uL (ref 3.4–10.8)

## 2015-08-31 LAB — IRON AND TIBC
Iron Saturation: 22 % (ref 15–55)
Iron: 62 ug/dL (ref 27–159)
Total Iron Binding Capacity: 277 ug/dL (ref 250–450)
UIBC: 215 ug/dL (ref 131–425)

## 2015-08-31 LAB — LIPID PANEL
CHOLESTEROL TOTAL: 172 mg/dL (ref 100–199)
Chol/HDL Ratio: 1.8 ratio units (ref 0.0–4.4)
HDL: 95 mg/dL (ref >39)
LDL CALC: 69 mg/dL (ref 0–99)
TRIGLYCERIDES: 41 mg/dL (ref 0–149)
VLDL CHOLESTEROL CAL: 8 mg/dL (ref 5–40)

## 2015-08-31 LAB — VITAMIN B12: Vitamin B-12: 530 pg/mL (ref 211–946)

## 2015-08-31 LAB — TSH: TSH: 1.95 u[IU]/mL (ref 0.450–4.500)

## 2015-08-31 LAB — VITAMIN D 25 HYDROXY (VIT D DEFICIENCY, FRACTURES): VIT D 25 HYDROXY: 27.2 ng/mL — AB (ref 30.0–100.0)

## 2015-08-31 NOTE — Telephone Encounter (Signed)
Last chronic office visit August 2015

## 2015-09-18 ENCOUNTER — Encounter: Payer: Self-pay | Admitting: Internal Medicine

## 2015-09-21 ENCOUNTER — Encounter: Payer: Self-pay | Admitting: Family Medicine

## 2015-09-21 ENCOUNTER — Ambulatory Visit (INDEPENDENT_AMBULATORY_CARE_PROVIDER_SITE_OTHER): Payer: 59 | Admitting: Family Medicine

## 2015-09-21 VITALS — BP 122/78 | Ht 64.0 in | Wt 187.6 lb

## 2015-09-21 DIAGNOSIS — D509 Iron deficiency anemia, unspecified: Secondary | ICD-10-CM | POA: Diagnosis not present

## 2015-09-21 DIAGNOSIS — M069 Rheumatoid arthritis, unspecified: Secondary | ICD-10-CM

## 2015-09-21 DIAGNOSIS — E038 Other specified hypothyroidism: Secondary | ICD-10-CM

## 2015-09-21 DIAGNOSIS — E559 Vitamin D deficiency, unspecified: Secondary | ICD-10-CM

## 2015-09-21 DIAGNOSIS — R740 Nonspecific elevation of levels of transaminase and lactic acid dehydrogenase [LDH]: Secondary | ICD-10-CM

## 2015-09-21 DIAGNOSIS — R7401 Elevation of levels of liver transaminase levels: Secondary | ICD-10-CM

## 2015-09-21 DIAGNOSIS — Z9889 Other specified postprocedural states: Secondary | ICD-10-CM | POA: Diagnosis not present

## 2015-09-21 DIAGNOSIS — Z9884 Bariatric surgery status: Secondary | ICD-10-CM | POA: Insufficient documentation

## 2015-09-21 NOTE — Progress Notes (Signed)
   Subjective:    Patient ID: [name], female    DOB: [birth year], 54 y.o.   MRN: [id number]  HPI Patient arrives to follow up and discuss recent lab work. Patient states her overall energy level is doing okay she is having severe trouble rheumatoid arthritis she is on methotrexate it seems to be helping her. She denies any high fever chills sweats. She does relate history of gastric bypass had recent lab work to look at various aspects of her blood she does have a history of iron deficient anemia because of all of this.   Review of Systems She denies fever chills sweats chest pain shortness of breath swelling in the legs she does relate joint pains aches and discomforts she denies abdominal pain    Objective:   Physical Exam Neck no masses lungs are clear no crackles heart is regular abdomen soft no guarding or rebound extremities no edema skin warm dry neurologic grossly normal  25 minutes was spent with the patient. Greater than half the time was spent in discussion and answering questions and counseling regarding the issues that the patient came in for today.      Assessment & Plan:  Elevated transaminase slightly elevated could be fatty liver could be related to elevated iron or could be related to methotrexate she will discuss with her rheumatologist  Elevated iron patient has history of iron deficient anemia I recommend that she cut back on the iron supplement to once every other day and recheck ferritin along with liver function in approximate 3 months  Low vitamin D reestablished 2000 units of vitamin D daily.  Rheumatoid arthritis she will be going back to her specialist and discuss possible biologic agents.

## 2015-09-30 ENCOUNTER — Ambulatory Visit (HOSPITAL_COMMUNITY)
Admission: RE | Admit: 2015-09-30 | Discharge: 2015-09-30 | Disposition: A | Discharge status: Home / Self Care | Payer: 59 | Source: Ambulatory Visit | Attending: Family Medicine | Admitting: Family Medicine

## 2015-09-30 ENCOUNTER — Ambulatory Visit (INDEPENDENT_AMBULATORY_CARE_PROVIDER_SITE_OTHER): Payer: 59 | Admitting: Family Medicine

## 2015-09-30 ENCOUNTER — Encounter: Payer: Self-pay | Admitting: Family Medicine

## 2015-09-30 VITALS — BP 118/80 | Temp 98.3°F | Ht 64.0 in | Wt 189.0 lb

## 2015-09-30 DIAGNOSIS — M5442 Lumbago with sciatica, left side: Secondary | ICD-10-CM

## 2015-09-30 NOTE — Progress Notes (Signed)
   Subjective:    Patient ID: [name], female    DOB: Mar 20, 1961, 54 y.o.   MRN: [id number]  Back Pain This is a new problem. The current episode started more than 1 month ago. The problem occurs intermittently. The problem is unchanged. The pain is present in the lumbar spine. The quality of the pain is described as aching. The pain radiates to the left thigh. The pain is moderate. The pain is the same all the time. Stiffness is present all day. She has tried NSAIDs and heat for the symptoms. The treatment provided mild relief.   Patient states that she has no other concerns at this time.   Review of Systems  Musculoskeletal: Positive for back pain.   patient relates pain in the lower back radiates into the left buttock sometimes into the left leg down toward the toe denies numbness in the leg. PMH benign     Objective:   Physical Exam   moderate low back pain on the left side positive straight leg raise patient can walk on her toes and on heels without difficulty Weakness noted in the legs, lungs clear heart regular      Assessment & Plan:   patient with significant low back pain range of motion exercises would be the best thing for this patient at this point I showed her some exercises also recommend doing an x-ray the lumbar spine because of her history of rheumatoid arthritis may need referral to physical therapy if ongoing troubles

## 2015-09-30 NOTE — Patient Instructions (Signed)
Sacral iliac strain  3 exercises Back stretch Knee to chest Pelvic tilt ( squish the marshmello)  Do each 10 times then rotate throiugh all 3 exercises 3 times

## 2015-10-29 ENCOUNTER — Other Ambulatory Visit: Payer: Self-pay | Admitting: Obstetrics & Gynecology

## 2015-11-02 LAB — CYTOLOGY - PAP

## 2015-11-11 ENCOUNTER — Other Ambulatory Visit: Payer: Self-pay | Admitting: *Deleted

## 2015-11-11 ENCOUNTER — Encounter: Payer: Self-pay | Admitting: *Deleted

## 2015-11-11 ENCOUNTER — Telehealth: Payer: Self-pay | Admitting: Family Medicine

## 2015-11-11 MED ORDER — GABAPENTIN 100 MG PO CAPS: 100.0000 mg | ORAL_CAPSULE | Freq: Three times a day (TID) | ORAL | Status: DC

## 2015-11-11 NOTE — Telephone Encounter (Signed)
Discussed with pt. Med sent to pharm. Pt wants to hold on pt for now. She will still do her exercises at home. Transferred to front to schedule f/u ov in 3 -4 weeks.

## 2015-11-11 NOTE — Telephone Encounter (Signed)
[facility]

## 2015-11-11 NOTE — Telephone Encounter (Signed)
Gabapentin 100mg  one tid #90 with one additional refill. Start with one qd for 3 days, then one bid for 3 days, then go to on tid. Per dr Nicki Reaper. Follow up in 3 -4 weeks with dr Nicki Reaper. Order pt is pt wants pt.

## 2015-11-11 NOTE — Telephone Encounter (Signed)
Seen 12/14 for back pain

## 2015-11-11 NOTE — Telephone Encounter (Signed)
Call talk with pt, next step would be physical therapy and if not getting better over next 2 to 3 weeks then MRI. See how pt doing

## 2015-11-11 NOTE — Telephone Encounter (Signed)
Pt called stating that she has been doing the suggested exercises for a little over three weeks. Pt states that she feels a little better but it still feels like it is going to go out on her. Pt wants to know the next step. Please advise.

## 2015-11-11 NOTE — Telephone Encounter (Signed)
Back is a little better. Still has pain down left leg. Some times she has to hold on to something so she doesn't lose her balance when pain hits her.

## 2015-12-04 ENCOUNTER — Ambulatory Visit (INDEPENDENT_AMBULATORY_CARE_PROVIDER_SITE_OTHER): Payer: 59 | Admitting: Family Medicine

## 2015-12-04 ENCOUNTER — Encounter: Payer: Self-pay | Admitting: Family Medicine

## 2015-12-04 VITALS — BP 102/66 | Ht 64.0 in | Wt 186.1 lb

## 2015-12-04 DIAGNOSIS — E038 Other specified hypothyroidism: Secondary | ICD-10-CM | POA: Diagnosis not present

## 2015-12-04 DIAGNOSIS — M25512 Pain in left shoulder: Secondary | ICD-10-CM

## 2015-12-04 DIAGNOSIS — M5442 Lumbago with sciatica, left side: Secondary | ICD-10-CM | POA: Diagnosis not present

## 2015-12-04 NOTE — Progress Notes (Signed)
   Subjective:    Patient ID: [name], female    DOB: 1961-04-09, 55 y.o.   MRN: [id number]  Back Pain This is a recurrent problem. The pain is present in the lumbar spine.  she states her lower back and sciatica are actually doing much better she is pleased with progress not taking gabapentin. Patient in today for a recheck of left sided sciatica.   States no other concerns this visit. She does relate some intermittent left shoulder pain with certain movements sometimes catches does not radiate down the arm no hand involvement.  Review of Systems  Musculoskeletal: Positive for back pain.  left leg pain intermittent Left shoulder pain intermittent Patient has questions about her recent lab testing    Objective:   Physical Exam  On exam neck no masses lungs clear hearts regular some subjective discomfort along the left trapezius area reflexes in arms and legs good negative straight leg raise. Strength is good.  The patient is affected by her rheumatic arthritis but she seems to be getting along okay. She understands that if her arthritis gets worse she is to follow-up with her rheumatologist    Assessment & Plan:  Hypothyroidism under good control current measures recent lab work looked good no need to repeat this until later this year Intermittent shoulder pain range of motion exercises it persists referral to orthopedics Left leg sciatica. May use a gabapentin we prescribed if she would like the right now it seems like she is starting to do better we will hold off on this

## 2016-01-05 ENCOUNTER — Encounter: Payer: Self-pay | Admitting: Family Medicine

## 2016-01-05 ENCOUNTER — Ambulatory Visit (INDEPENDENT_AMBULATORY_CARE_PROVIDER_SITE_OTHER): Payer: 59 | Admitting: Family Medicine

## 2016-01-05 VITALS — BP 112/68 | Ht 64.0 in | Wt 185.0 lb

## 2016-01-05 DIAGNOSIS — G542 Cervical root disorders, not elsewhere classified: Secondary | ICD-10-CM

## 2016-01-05 MED ORDER — PREDNISONE 20 MG PO TABS: ORAL_TABLET | ORAL | Status: DC

## 2016-01-05 MED ORDER — HYDROCODONE-ACETAMINOPHEN 5-325 MG PO TABS: 1.0000 | ORAL_TABLET | Freq: Four times a day (QID) | ORAL | Status: DC | PRN

## 2016-01-05 NOTE — Progress Notes (Signed)
   Subjective:    Patient ID: [name], female    DOB: [birth year], 55 y.o.   MRN: [id number]  HPI Patient arrives with c/o neck pain that goes down her left arm for a while-sometimes it keeps her up at night. Patient has history of rheumatoid arthritis She has neck pain that radiates into the JPs he is in the back of her arm some discomfort down the arm as well keeps her awake at night some pain during the day it is difficult for her to function at work because of the pain in the discomfort denies high fever chills sweats Review of Systems See above.   denies any chest pressure tightness pain or shortness of breath Objective:   Physical Exam  Lungs are clear hearts regular subjective discomfort down the left side of neck into the trapezius in the back of the arm reflexes good strength good      Assessment & Plan:  Cervical impingement-prednisone taper hopefully this will help but if not seen significant improvement over the course of the next 7-10 days then the next step would be: Cervical x-rays possible gabapentin would be next MRI would follow if ongoing troubles with possible referral to orthopedic back specialists

## 2016-01-06 ENCOUNTER — Other Ambulatory Visit: Payer: Self-pay | Admitting: Family Medicine

## 2016-01-13 ENCOUNTER — Telehealth: Payer: Self-pay | Admitting: Family Medicine

## 2016-01-13 DIAGNOSIS — M542 Cervicalgia: Secondary | ICD-10-CM

## 2016-01-13 MED ORDER — GABAPENTIN 100 MG PO CAPS: 100.0000 mg | ORAL_CAPSULE | Freq: Three times a day (TID) | ORAL | Status: DC

## 2016-01-13 NOTE — Telephone Encounter (Signed)
Notified patient recommend plain cervical x-rays at this point in time for cervical impingement, patient may also benefit from gabapentin 100 mg 3 times a day to see if this will help with a nerve related pain start off first with one at nighttime for 5 days then 1 twice daily for the next week if that does not do enough to help then increase it to one 3 times a day, #90, 3 refills, if not significantly improving by next week more discussion will be needed. Patient can call us next week if not improving otherwise follow-up in 3 weeks. Patient verbalized understanding. Med sent to pharmacy, xray in system.

## 2016-01-13 NOTE — Telephone Encounter (Signed)
I would recommend plain cervical x-rays at this point in time for cervical impingement, patient may also benefit from gabapentin 100 mg 3 times a day to see if this will help with a nerve related pain start off first with one at nighttime for 5 days then 1 twice daily for the next week if that does not do enough to help then increase it to one 3 times a day, #90, 3 refills, if not significantly improving by next week more discussion will be needed. Patient can call us next week if not improving otherwise follow-up in 3 weeks

## 2016-01-13 NOTE — Telephone Encounter (Signed)
Pt calling back per docs request. Has pinched nerve in neck/shoulder area He says it is some better at this point. Most trouble is at night when she tries to  Sleep. Wants to know if she needs to give it some more time or does she need  To move forward with xrays/scans at this point?   She would also like her w/e extended to April 3rd from March 30th   Please advise

## 2016-01-14 ENCOUNTER — Encounter: Payer: Self-pay | Admitting: Family Medicine

## 2016-01-14 ENCOUNTER — Ambulatory Visit (HOSPITAL_COMMUNITY)
Admission: RE | Admit: 2016-01-14 | Discharge: 2016-01-14 | Disposition: A | Discharge status: Home / Self Care | Payer: 59 | Source: Ambulatory Visit | Attending: Family Medicine | Admitting: Family Medicine

## 2016-01-14 DIAGNOSIS — M542 Cervicalgia: Secondary | ICD-10-CM | POA: Insufficient documentation

## 2016-01-19 ENCOUNTER — Ambulatory Visit: Payer: 59 | Admitting: Family Medicine

## 2016-02-04 ENCOUNTER — Telehealth: Payer: Self-pay | Admitting: Family Medicine

## 2016-02-04 DIAGNOSIS — M542 Cervicalgia: Secondary | ICD-10-CM

## 2016-02-04 DIAGNOSIS — M25512 Pain in left shoulder: Secondary | ICD-10-CM

## 2016-02-04 NOTE — Telephone Encounter (Signed)
Seen 3/21

## 2016-02-04 NOTE — Telephone Encounter (Signed)
#  1 I truly doubt if MRI facility can get her MRI before she goes on vacation #2 blastoma saw the patient the patient was having pain that was radiating from the neck into the shoulder and down the arm if that is the case in and is still going on than what the patient needs is a cervical MRI not shoulder MRI. We can certainly order in the proper MRI given what her symptoms are. Please question the patient the full extent of her symptoms currently. Where is the pain? It doesn't radiate down the arm? Is there weakness or limited range of motion involved? Did the treatments that we suggested on her last visit help any? Also it is important for the patient to understand that it is possible that it may be rejected by the insurance company in regards to approving the MRI if that happens typically we have to set a person up with the specialists for further evaluation then the specialist can get the MRI approved

## 2016-02-04 NOTE — Telephone Encounter (Signed)
Patient is still having left-shoulder pain and is requesting MRI as soon as possible. She will be on vacation next week and going out of town.

## 2016-02-05 ENCOUNTER — Other Ambulatory Visit: Payer: Self-pay | Admitting: *Deleted

## 2016-02-05 DIAGNOSIS — M25512 Pain in left shoulder: Secondary | ICD-10-CM

## 2016-02-05 DIAGNOSIS — M542 Cervicalgia: Secondary | ICD-10-CM

## 2016-02-05 NOTE — Telephone Encounter (Signed)
[facility]

## 2016-02-05 NOTE — Telephone Encounter (Signed)
Pt having pain in neck, left shoulder, and left arm. Same as when seen.   Pt also wants to know if she can have MRI of her left hip at the same time. She states she has been seen for this before and has had an xray. Pain started about 6 months ago. Has tried pain meds when its really bad. Pain does not radiate.   Pt wants open MRI at [facility] in [place]. Pt can be there any day 5pm or later.

## 2016-02-05 NOTE — Telephone Encounter (Signed)
I recommend the having cervical spine MRI. Patient is tried conservative approach including medication stretching and had this for over 6 weeks with severe pain keeping her awake at night.(If the MRI cannot get approved then we may have to refer patient to specialist) as for the hip that would need to be addressed at a separate time in regards to MRI.

## 2016-02-05 NOTE — Telephone Encounter (Signed)
Discussed with pt. Pt having open MRI but still wants something called into [facility] to help her relax during the procedure. Pt will be out of town next week. Call pt on cell phone after med sent to pharm if approved. Pt know this will be done next week.    Test schedule at [facility] May 1st register 5 pm. Pt notifed of appt and location. Order faxed to [facility].

## 2016-02-07 NOTE — Telephone Encounter (Signed)
Ativan, 0.5 mg, #4, take 1 tablet 60 minutes before procedure do not drive with this medicine have family drive patient, no refills

## 2016-02-08 MED ORDER — LORAZEPAM 0.5 MG PO TABS: ORAL_TABLET | ORAL | Status: DC

## 2016-02-08 NOTE — Telephone Encounter (Signed)
Patient notified. Script faxed to pharmacy.

## 2016-02-08 NOTE — Addendum Note (Signed)
Addended by: Jesusita Oka on: 02/08/2016 08:46 AM   Modules accepted: Orders

## 2016-02-16 ENCOUNTER — Telehealth: Payer: Self-pay | Admitting: Family Medicine

## 2016-02-16 DIAGNOSIS — M25519 Pain in unspecified shoulder: Secondary | ICD-10-CM

## 2016-02-16 DIAGNOSIS — M542 Cervicalgia: Secondary | ICD-10-CM

## 2016-02-16 NOTE — Telephone Encounter (Signed)
MRI the cervical spine is normal. The next step in this process would be seen orthopedics. I would recommend we set her up with orthopedics for evaluation of neck and shoulder pain. [place] or orthopedics of her choice would be fine

## 2016-02-16 NOTE — Addendum Note (Signed)
Addended by: Ofilia Neas R on: 02/16/2016 02:12 PM   Modules accepted: Orders

## 2016-02-16 NOTE — Telephone Encounter (Signed)
Called patient and informed her per Dr.Scott Luking- MRI the cervical spine is normal. The next step in this process would be seen orthopedics. I would recommend we set her up with orthopedics for evaluation of neck and shoulder pain. Patient verbalized understanding and stated that she is fine with [place]. Referral to be put into Epic.

## 2016-02-18 ENCOUNTER — Encounter: Payer: Self-pay | Admitting: Family Medicine

## 2016-02-27 ENCOUNTER — Other Ambulatory Visit: Payer: Self-pay | Admitting: Family Medicine

## 2016-08-24 ENCOUNTER — Other Ambulatory Visit: Payer: Self-pay | Admitting: Family Medicine

## 2016-10-04 ENCOUNTER — Other Ambulatory Visit: Payer: Self-pay | Admitting: Family Medicine

## 2016-10-06 ENCOUNTER — Telehealth: Payer: Self-pay | Admitting: Pediatrics

## 2016-10-06 NOTE — Telephone Encounter (Signed)
Refills already sent to pharmacy

## 2016-11-14 ENCOUNTER — Other Ambulatory Visit: Payer: Self-pay | Admitting: Dermatology

## 2016-11-14 DIAGNOSIS — D692 Other nonthrombocytopenic purpura: Secondary | ICD-10-CM | POA: Diagnosis not present

## 2016-11-14 DIAGNOSIS — D229 Melanocytic nevi, unspecified: Secondary | ICD-10-CM | POA: Diagnosis not present

## 2016-11-14 DIAGNOSIS — D492 Neoplasm of unspecified behavior of bone, soft tissue, and skin: Secondary | ICD-10-CM | POA: Diagnosis not present

## 2016-11-29 DIAGNOSIS — M0570 Rheumatoid arthritis with rheumatoid factor of unspecified site without organ or systems involvement: Secondary | ICD-10-CM | POA: Diagnosis not present

## 2016-11-29 DIAGNOSIS — M79642 Pain in left hand: Secondary | ICD-10-CM | POA: Diagnosis not present

## 2016-11-29 DIAGNOSIS — M79641 Pain in right hand: Secondary | ICD-10-CM | POA: Diagnosis not present

## 2016-12-04 ENCOUNTER — Other Ambulatory Visit: Payer: Self-pay | Admitting: Family Medicine

## 2016-12-21 ENCOUNTER — Other Ambulatory Visit: Payer: Self-pay | Admitting: Family Medicine

## 2016-12-22 ENCOUNTER — Telehealth: Payer: Self-pay | Admitting: Family Medicine

## 2016-12-22 NOTE — Telephone Encounter (Signed)
Pt states that her insurance is no longer covering the FeFum-FePoly-FA-B Cmp-C-Biot (INTEGRA PLUS) CAPS  WIll cost patient $40.00 - wonders if she really needs to be on this or can she use something else that's cheaper or OTC  Please advise

## 2016-12-23 NOTE — Telephone Encounter (Signed)
This particular formulation is easy or to absorb but if patient cannot afford it iron sulfate 325 mg one daily is another option available I believe over-the-counter

## 2016-12-23 NOTE — Telephone Encounter (Signed)
Discussed with pt. Pt verbalized understanding.  °

## 2016-12-27 ENCOUNTER — Telehealth: Payer: Self-pay | Admitting: Family Medicine

## 2016-12-27 DIAGNOSIS — M79641 Pain in right hand: Secondary | ICD-10-CM | POA: Diagnosis not present

## 2016-12-27 DIAGNOSIS — D509 Iron deficiency anemia, unspecified: Secondary | ICD-10-CM | POA: Diagnosis not present

## 2016-12-27 DIAGNOSIS — M0570 Rheumatoid arthritis with rheumatoid factor of unspecified site without organ or systems involvement: Secondary | ICD-10-CM | POA: Diagnosis not present

## 2016-12-27 DIAGNOSIS — M79642 Pain in left hand: Secondary | ICD-10-CM | POA: Diagnosis not present

## 2016-12-27 DIAGNOSIS — E038 Other specified hypothyroidism: Secondary | ICD-10-CM

## 2016-12-27 NOTE — Telephone Encounter (Signed)
Last seen here 12/2015

## 2016-12-27 NOTE — Telephone Encounter (Signed)
Blood work ordered in EPIC. Patient notified. 

## 2016-12-27 NOTE — Telephone Encounter (Signed)
TSH, CBC, TIBC, ferritin, B12-anemia, hypothyroidism

## 2016-12-27 NOTE — Telephone Encounter (Signed)
Patient is requesting blood work ordered for her thyroid and iron.  She said she has to have blood work done for her rheumatologist soon and would like to have this done at the same time.

## 2016-12-28 LAB — TSH: TSH: 1.13 u[IU]/mL (ref 0.450–4.500)

## 2016-12-28 LAB — IRON AND TIBC
IRON: 101 ug/dL (ref 27–159)
Iron Saturation: 32 % (ref 15–55)
Total Iron Binding Capacity: 311 ug/dL (ref 250–450)
UIBC: 210 ug/dL (ref 131–425)

## 2016-12-28 LAB — FERRITIN: Ferritin: 219 ng/mL — ABNORMAL HIGH (ref 15–150)

## 2016-12-28 LAB — VITAMIN B12: VITAMIN B 12: 410 pg/mL (ref 232–1245)

## 2016-12-30 ENCOUNTER — Ambulatory Visit (INDEPENDENT_AMBULATORY_CARE_PROVIDER_SITE_OTHER): Payer: 59 | Admitting: Family Medicine

## 2016-12-30 ENCOUNTER — Encounter: Payer: Self-pay | Admitting: Family Medicine

## 2016-12-30 VITALS — BP 110/70 | Ht 64.0 in | Wt 190.0 lb

## 2016-12-30 DIAGNOSIS — E038 Other specified hypothyroidism: Secondary | ICD-10-CM

## 2016-12-30 MED ORDER — LEVOTHYROXINE SODIUM 88 MCG PO TABS: 88.0000 ug | ORAL_TABLET | Freq: Every day | ORAL | 3 refills | Status: DC

## 2016-12-30 NOTE — Patient Instructions (Signed)
Call us in 6 months to repeat Iron studies  Follow up in 1 year

## 2016-12-30 NOTE — Progress Notes (Signed)
   Subjective:    Patient ID: [name], female    DOB: 1961-07-01, 56 y.o.   MRN: [id number]  HPI Patient in today for med check for hypothyroidism. Had recent labs on 12/27/2016. Patient takes iron tablets may be 2 days a week her recent lab work overall looks good she is uncertain if she needs to keep taken iron tablets She does take her thyroid medicine on a regular basis. Denies any problems with that. States no other concerns this visit.  Review of Systems  Constitutional: Negative for activity change, fatigue and fever.  Respiratory: Negative for cough and shortness of breath.   Cardiovascular: Negative for chest pain and leg swelling.  Neurological: Negative for headaches.       Objective:   Physical Exam  Constitutional: She appears well-nourished. No distress.  Cardiovascular: Normal rate, regular rhythm and normal heart sounds.   No murmur heard. Pulmonary/Chest: Effort normal and breath sounds normal. No respiratory distress.  Musculoskeletal: She exhibits no edema.  Lymphadenopathy:    She has no cervical adenopathy.  Neurological: She is alert. She exhibits normal muscle tone.  Psychiatric: Her behavior is normal.  Vitals reviewed.         Assessment & Plan:  Hypothyroidism continue medication lab work recommended yearly recheck patient in one year  Iron deficient C related to gastric bypass recent lab work looked good patient is got a hold off on iron tablets and recheck her labs in 6 months

## 2017-01-02 ENCOUNTER — Telehealth: Payer: Self-pay | Admitting: Family Medicine

## 2017-01-02 NOTE — Telephone Encounter (Signed)
Review blood work results faxed over from [facility] in [place].

## 2017-01-04 NOTE — Telephone Encounter (Signed)
I reviewed over her lab results that were sent from [facility]. I do not feel the patient needs to take any additional oral iron-I do recommend repeat studies in 6 months-patient may well be aware of this already

## 2017-01-05 ENCOUNTER — Telehealth: Payer: Self-pay | Admitting: Family Medicine

## 2017-01-05 NOTE — Telephone Encounter (Signed)
Discussed with pt. Pt verbalized understanding.  °

## 2017-01-05 NOTE — Telephone Encounter (Signed)
When she does follow-up blood work in 6 months we will also include liver profile because one liver enzyme was minimally elevated.

## 2017-01-31 DIAGNOSIS — M79601 Pain in right arm: Secondary | ICD-10-CM | POA: Diagnosis not present

## 2017-01-31 DIAGNOSIS — M0570 Rheumatoid arthritis with rheumatoid factor of unspecified site without organ or systems involvement: Secondary | ICD-10-CM | POA: Diagnosis not present

## 2017-01-31 DIAGNOSIS — R7989 Other specified abnormal findings of blood chemistry: Secondary | ICD-10-CM | POA: Diagnosis not present

## 2017-03-08 ENCOUNTER — Telehealth: Payer: Self-pay | Admitting: Family Medicine

## 2017-03-08 ENCOUNTER — Other Ambulatory Visit: Payer: Self-pay | Admitting: *Deleted

## 2017-03-08 MED ORDER — FLUCONAZOLE 150 MG PO TABS: ORAL_TABLET | ORAL | 0 refills | Status: DC

## 2017-03-08 NOTE — Telephone Encounter (Signed)
Diflucan 150mg  #2 one po 3 days apart sent to pharm per dr Nicki Reaper. Pt notified on voicemail.

## 2017-03-08 NOTE — Telephone Encounter (Signed)
Pt is requesting diflucan to be called in. Pt was recently on antibiotics.    [place]

## 2017-03-16 DIAGNOSIS — M722 Plantar fascial fibromatosis: Secondary | ICD-10-CM | POA: Diagnosis not present

## 2017-03-16 DIAGNOSIS — E139 Other specified diabetes mellitus without complications: Secondary | ICD-10-CM | POA: Diagnosis not present

## 2017-03-24 ENCOUNTER — Ambulatory Visit: Payer: 59 | Admitting: Podiatry

## 2017-04-05 DIAGNOSIS — M0579 Rheumatoid arthritis with rheumatoid factor of multiple sites without organ or systems involvement: Secondary | ICD-10-CM | POA: Diagnosis not present

## 2017-04-05 DIAGNOSIS — M7582 Other shoulder lesions, left shoulder: Secondary | ICD-10-CM | POA: Diagnosis not present

## 2017-04-05 DIAGNOSIS — Z79899 Other long term (current) drug therapy: Secondary | ICD-10-CM | POA: Diagnosis not present

## 2017-06-22 ENCOUNTER — Ambulatory Visit (INDEPENDENT_AMBULATORY_CARE_PROVIDER_SITE_OTHER): Payer: 59 | Admitting: Family Medicine

## 2017-06-22 ENCOUNTER — Encounter: Payer: Self-pay | Admitting: Family Medicine

## 2017-06-22 VITALS — BP 122/82 | Ht 64.0 in | Wt 203.6 lb

## 2017-06-22 DIAGNOSIS — M25512 Pain in left shoulder: Secondary | ICD-10-CM | POA: Diagnosis not present

## 2017-06-22 DIAGNOSIS — M791 Myalgia, unspecified site: Secondary | ICD-10-CM

## 2017-06-22 DIAGNOSIS — M7582 Other shoulder lesions, left shoulder: Secondary | ICD-10-CM | POA: Diagnosis not present

## 2017-06-22 DIAGNOSIS — F321 Major depressive disorder, single episode, moderate: Secondary | ICD-10-CM | POA: Diagnosis not present

## 2017-06-22 MED ORDER — HYDROCODONE-ACETAMINOPHEN 5-325 MG PO TABS: 1.0000 | ORAL_TABLET | Freq: Four times a day (QID) | ORAL | 0 refills | Status: DC | PRN

## 2017-06-22 MED ORDER — DULOXETINE HCL 30 MG PO CPEP: 30.0000 mg | ORAL_CAPSULE | Freq: Every day | ORAL | 2 refills | Status: DC

## 2017-06-22 MED ORDER — LORAZEPAM 0.5 MG PO TABS: ORAL_TABLET | ORAL | 0 refills | Status: DC

## 2017-06-22 NOTE — Progress Notes (Addendum)
Subjective:    Patient ID: [name], female    DOB: [birth year], 56 y.o.   MRN: [id number]  HPI Patient arrives with c/o left arm pain. Patient states it has been bothering her for a while. This patient has rheumatoid arthritis [name]'s been followed by Dr. Loney Laurence low now [name] is followed by [place] [name] wonders if rheumatoid arthritis is really causing all her troubles or not [name] relates a lot of joint pains and discomfort although the medication is helping [name] also relates a lot of muscle aches soreness and just not feeling well [name] denies high fever chills sweats. Her energy level is subpar In addition to this [name] has left shoulder pain and discomfort hurts with movement [name] denies any numbness going down the arm [name] had a MRI of the neck previously that looked good [name] is had physical therapy [name] is also tried anti-inflammatories [name] is seen orthopedist who did exercises and injections [name] is "chronic shoulder pain and discomfort hurts with movement it inhibits her activities and causes her to have restless nights because [name] cannot lay on that side. [name] states [name] feels so bad over the past few months that [name] finds herself feeling depressed and sad denies being suicidal pH Q9 and rated moderate depression  Review of Systems  Constitutional: Negative for activity change, fatigue and fever.  HENT: Negative for congestion.   Respiratory: Negative for cough, chest tightness and shortness of breath.   Cardiovascular: Negative for chest pain and leg swelling.  Gastrointestinal: Negative for abdominal pain.  Skin: Negative for color change.  Neurological: Negative for headaches.  Psychiatric/Behavioral: Negative for behavioral problems.       Objective:   Physical Exam  Constitutional: [name] appears well-developed and well-nourished. No distress.  HENT:  Head: Normocephalic and atraumatic.  Eyes: Right eye exhibits no discharge. Left eye exhibits no discharge.    Neck: No tracheal deviation present.  Cardiovascular: Normal rate, regular rhythm and normal heart sounds.   No murmur heard. Pulmonary/Chest: Effort normal and breath sounds normal. No respiratory distress. [name] has no wheezes. [name] has no rales.  Musculoskeletal: [name] exhibits no edema.  Lymphadenopathy:    [name] has no cervical adenopathy.  Neurological: [name] is alert. [name] exhibits normal muscle tone.  Skin: Skin is warm and dry. No erythema.  Psychiatric: Her behavior is normal.  Vitals reviewed. Has tenderness and pain in the left shoulder multiple tests that were done to test the rotator cuff were positive. More than likely tendinitis but possibility of rotator cuff tear   25 minutes was spent with the patient. Greater than half the time was spent in discussion and answering questions and counseling regarding the issues that the patient came in for today.      Assessment & Plan:  Left shoulder pain x-ray indicated, more than likely x-ray will not show anything we will need to progress to an MRI [name] is had this pain for year [name] is tried injections physical therapy we have even tried various anti-inflammatories and medications without success [name] has indications at point toward the possibility of a rotator cuff tear MRI indicated may need referral back to orthopedics  Possible early fibromyalgia hard to tell for certain but Cymbalta should help stretching exercises will be helpful patient will do a questionnaire and send that back to [place]  Rheumatoid arthritis under the care of the specialist [name] wonders if [name] really does have rheumatoid arthritis I informed her it is best for her  to discuss this with her specialist  Depression moderate I recommend antidepressant [name] is not suicidal [name] will follow-up in 4 weeks [name] will follow-up sooner if any particular problems [name] was warned that if [name] starts getting worse [name] needs a follow-up right away  If the patient does need surgery for her  older [name] is considered low risk from a cardiovascular standpoint and it would be necessary for her to discuss with the surgeon risk and benefits of any proposed surgery for her to make the most informed choice. From our perspective [name] is medically cleared for any type of standard shoulder surgery

## 2017-06-23 ENCOUNTER — Ambulatory Visit (HOSPITAL_COMMUNITY)
Admission: RE | Admit: 2017-06-23 | Discharge: 2017-06-23 | Disposition: A | Discharge status: Home / Self Care | Payer: 59 | Source: Ambulatory Visit | Attending: Family Medicine | Admitting: Family Medicine

## 2017-06-23 ENCOUNTER — Telehealth: Payer: Self-pay | Admitting: *Deleted

## 2017-06-23 DIAGNOSIS — M25512 Pain in left shoulder: Secondary | ICD-10-CM | POA: Diagnosis not present

## 2017-06-23 NOTE — Telephone Encounter (Signed)
Colleen Drown, MD  [facility]        Please inquire with the patient when was last time she had bloodwork drawn with rheumatology in [place]? If it has been within the past 60 days please try to get a copy of the lab work please let me know thank you    Called pt and she states it has been 4 -5 months ago when she had bloodwork. She is going back to rheumatology sept 17th she thinks. If not the 17th then the next week.

## 2017-06-25 NOTE — Telephone Encounter (Signed)
When she gets her lab work completed please have her requests that her rheumatologist fax these lab results to [place]

## 2017-06-26 DIAGNOSIS — M79672 Pain in left foot: Secondary | ICD-10-CM | POA: Diagnosis not present

## 2017-06-26 DIAGNOSIS — M79671 Pain in right foot: Secondary | ICD-10-CM | POA: Diagnosis not present

## 2017-06-26 DIAGNOSIS — M722 Plantar fascial fibromatosis: Secondary | ICD-10-CM | POA: Diagnosis not present

## 2017-06-26 NOTE — Telephone Encounter (Signed)
Left message to return call 

## 2017-06-27 ENCOUNTER — Telehealth: Payer: Self-pay | Admitting: *Deleted

## 2017-06-27 NOTE — Telephone Encounter (Signed)
Patient is aware 

## 2017-06-27 NOTE — Telephone Encounter (Signed)
MRI of Left shoulder w/o contrast denied by [employer]-requiring peer to peer. See printout in red folder

## 2017-06-29 NOTE — Telephone Encounter (Signed)
Peer to peer evaluation was completed. MRI approved. MRI approval number is [id number]

## 2017-06-29 NOTE — Telephone Encounter (Signed)
Patient is scheduled for 07/07/2017 @ 8:45a for 9:00p appt at [facility]

## 2017-06-30 NOTE — Telephone Encounter (Signed)
Patient has open MRI scheduled at [facility] 07/03/17-Patient is aware

## 2017-07-03 DIAGNOSIS — M75112 Incomplete rotator cuff tear or rupture of left shoulder, not specified as traumatic: Secondary | ICD-10-CM | POA: Diagnosis not present

## 2017-07-03 DIAGNOSIS — M7522 Bicipital tendinitis, left shoulder: Secondary | ICD-10-CM | POA: Diagnosis not present

## 2017-07-03 DIAGNOSIS — M66822 Spontaneous rupture of other tendons, left upper arm: Secondary | ICD-10-CM | POA: Diagnosis not present

## 2017-07-04 ENCOUNTER — Telehealth: Payer: Self-pay | Admitting: Family Medicine

## 2017-07-04 DIAGNOSIS — M7582 Other shoulder lesions, left shoulder: Secondary | ICD-10-CM

## 2017-07-04 NOTE — Telephone Encounter (Signed)
Certainly the MRI shows multiple findings that can give the patient significant pain. There is a small tear in one of the tendons within the rotator cuff. There is also a significant partial tear of knot in the bicep tendons causing significant inflammation. In addition to this there is some mild joint arthritis of the before meals joint with some spurring which is causing and narrowing with shoulder called outlet impingement. All of this can cause significant pain discomfort. It is in the patient's best interest to be seen by orthopedics specialist. In addition to this more than likely they will do physical therapy and if this fails they may well progress toward injections or possible surgery. Please proceed forward with orthopedic referral

## 2017-07-04 NOTE — Telephone Encounter (Signed)
Review MRI Shoulder wo contrast left results in results folder.

## 2017-07-05 NOTE — Telephone Encounter (Signed)
Left message return call 07/05/17

## 2017-07-05 NOTE — Telephone Encounter (Signed)
Spoke with patient and informed her per Dr.Scott Luking- Certainly the MRI shows multiple findings that can give you significant pain. There is a small tear in one of the tendons within the rotator cuff. There is also a significant partial tear of knot in the bicep tendons causing significant inflammation. In addition to this there is some mild joint arthritis of the before  joint with some spurring which is causing and narrowing with shoulder called outlet impingement. All of this can cause significant pain discomfort. It is in your best interest to be seen by orthopedics specialist. In addition to this more than likely they will do physical therapy and if this fails they may well progress toward injections or possible surgery.Patient verbalized understanding.

## 2017-07-06 DIAGNOSIS — M0579 Rheumatoid arthritis with rheumatoid factor of multiple sites without organ or systems involvement: Secondary | ICD-10-CM | POA: Diagnosis not present

## 2017-07-06 DIAGNOSIS — Z79899 Other long term (current) drug therapy: Secondary | ICD-10-CM | POA: Diagnosis not present

## 2017-07-06 DIAGNOSIS — M7582 Other shoulder lesions, left shoulder: Secondary | ICD-10-CM | POA: Diagnosis not present

## 2017-07-07 ENCOUNTER — Telehealth: Payer: Self-pay | Admitting: Family Medicine

## 2017-07-07 ENCOUNTER — Ambulatory Visit (HOSPITAL_COMMUNITY): Payer: 59

## 2017-07-07 NOTE — Telephone Encounter (Signed)
Review lab results in results folder. °

## 2017-07-10 NOTE — Telephone Encounter (Signed)
The patient's lab work overall looks good. Thyroid function good. Continue current medicine. Follow-up 6 months. Sedimentation rate normal. It would be fine for the patient to have a copy of her lab work.

## 2017-07-10 NOTE — Telephone Encounter (Signed)
Results discussed with patient. Patient advised lab work overall looks good. Thyroid function good. Continue current medicine. Follow-up 6 months. Sedimentation rate normal. Patient verbalized understanding.

## 2017-07-11 DIAGNOSIS — M19012 Primary osteoarthritis, left shoulder: Secondary | ICD-10-CM | POA: Diagnosis not present

## 2017-07-16 ENCOUNTER — Telehealth: Payer: Self-pay | Admitting: Family Medicine

## 2017-07-16 NOTE — Telephone Encounter (Signed)
Nurse's-please let the patient know that I did receive a request from Dr. Noemi Chapel for medical clearance for her upcoming surgery. I reviewed over his know. I believe that she would be able to do the surgery with low cardiac risk. It is important for the patient to understand that there is no such thing as a surgery without any risk. It is important for her to discuss with the surgeon risk and benefits of the surgery before making a decision. Finally based on her previous visit and the notes from her surgeon I believe we can go ahead and give her medical clearance but if she would like to come in and sit down and discuss this before doing surgery we will be happy to do so otherwise we can send notification to the surgeon regarding clearance from a medical perspective. Please discuss with the patient find out what she would like to do.

## 2017-07-17 HISTORY — PX: ROTATOR CUFF REPAIR: SHX139

## 2017-07-17 NOTE — Telephone Encounter (Signed)
It is best for the patient not to take hydrocodone the day before or the day of surgery. It is fine to continue Cymbalta and levothyroxine. On the day of surgery- More than likely they have told her to be nothing by mouth for surgery.

## 2017-07-17 NOTE — Telephone Encounter (Signed)
Left message to return call 

## 2017-07-17 NOTE — Telephone Encounter (Signed)
Pt states she does not need to come in and she is fine with just sending over the medical clearance. She feels she is healthy and knows about the risk of having surgery. She states she was told to stop any supplements and methotrexate before surgery. She would like to know if she needs to stop cymbalta, levothyroxine or hydrocodone before surgery. She states she has not been taking hydrocodone. Just has it as needed.

## 2017-07-17 NOTE — Telephone Encounter (Signed)
Also I faxed form for pre-operative clearance. See message below

## 2017-07-17 NOTE — Telephone Encounter (Signed)
Discussed with pt. Pt verbalized understanding.  °

## 2017-07-19 DIAGNOSIS — M722 Plantar fascial fibromatosis: Secondary | ICD-10-CM | POA: Diagnosis not present

## 2017-07-19 DIAGNOSIS — M79672 Pain in left foot: Secondary | ICD-10-CM | POA: Diagnosis not present

## 2017-07-19 DIAGNOSIS — M79671 Pain in right foot: Secondary | ICD-10-CM | POA: Diagnosis not present

## 2017-07-24 DIAGNOSIS — Z79899 Other long term (current) drug therapy: Secondary | ICD-10-CM | POA: Diagnosis not present

## 2017-07-28 DIAGNOSIS — M7542 Impingement syndrome of left shoulder: Secondary | ICD-10-CM | POA: Diagnosis not present

## 2017-07-28 DIAGNOSIS — M75122 Complete rotator cuff tear or rupture of left shoulder, not specified as traumatic: Secondary | ICD-10-CM | POA: Diagnosis not present

## 2017-07-28 DIAGNOSIS — G8918 Other acute postprocedural pain: Secondary | ICD-10-CM | POA: Diagnosis not present

## 2017-07-28 DIAGNOSIS — M24112 Other articular cartilage disorders, left shoulder: Secondary | ICD-10-CM | POA: Diagnosis not present

## 2017-07-28 DIAGNOSIS — M19012 Primary osteoarthritis, left shoulder: Secondary | ICD-10-CM | POA: Diagnosis not present

## 2017-08-01 ENCOUNTER — Ambulatory Visit (HOSPITAL_COMMUNITY): Payer: 59 | Attending: Orthopedic Surgery | Admitting: Occupational Therapy

## 2017-08-01 DIAGNOSIS — M25512 Pain in left shoulder: Secondary | ICD-10-CM | POA: Insufficient documentation

## 2017-08-01 DIAGNOSIS — M25612 Stiffness of left shoulder, not elsewhere classified: Secondary | ICD-10-CM | POA: Diagnosis not present

## 2017-08-01 DIAGNOSIS — R29898 Other symptoms and signs involving the musculoskeletal system: Secondary | ICD-10-CM | POA: Insufficient documentation

## 2017-08-01 NOTE — Therapy (Signed)
[place], [place], [address] Phone: [phone]   Fax:  [phone]  Occupational Therapy Evaluation  Patient Details  Name: [name] MRN: [id number] Date of Birth: [birth year] Referring Provider: Dr. Elsie Saas  Encounter Date: 08/01/2017      OT End of Session - 08/01/17 1523    Visit Number 1   Number of Visits 16   Date for OT Re-Evaluation 09/30/17  Mini-reassessment 08/30/17   Authorization Type [employer]   Authorization Time Period 60 visit limit combined, no visits used; $30 copay   Authorization - Visit Number 1   Authorization - Number of Visits 49   OT Start Time 1435   OT Stop Time 1515   OT Time Calculation (min) 40 min   Activity Tolerance Patient tolerated treatment well   Behavior During Therapy [facility] for tasks assessed/performed      Past Medical History:  Diagnosis Date  . Abdominal pain of unknown etiology   . Anal fistula    hx of   . Arthritis   . Constipation    severe  . Depression   . GERD (gastroesophageal reflux disease)   . Hiatal hernia   . Hyperthyroidism   . Prediabetes   . S/P colonoscopy April 2011   Dr. Olevia Perches: mild diverticulosis, hyperplastic polyps, repeat in 10 years  . S/P endoscopy April 2011   Dr. Olevia Perches: no hiatal hernia, generous opening to antrum., patent gastrojejunostomy    Past Surgical History:  Procedure Laterality Date  . anal fistulostomy     w/marsupialization an excision of anal tags  . BREAST LUMPECTOMY     benign  . COLONOSCOPY    . ESOPHAGOGASTRODUODENOSCOPY    . GASTRIC BYPASS     mini gastric bypass in [place] reportedly    There were no vitals filed for this visit.      Subjective Assessment - 08/01/17 1521    Subjective  S: I haven't used this left arm in so long it will feel strange to use it again.    Pertinent History Pt is a 56 y/o female s/p left shoulder arthroscopy, debridement, SAD, DCR on 07/28/17. Pt reports  she has been wearing her sling and using ice for 1 hour, 4x/day as instructed by MD. Pt was referred to occupational therapy for evaluation and treatment by Dr. Elsie Saas.    Special Tests FOTO Score: 4/100 (96% impairment)   Patient Stated Goals To be able to use my left arm.    Currently in Pain? No/denies           [facility] OT Assessment - 08/01/17 1434      Assessment   Diagnosis s/p Left shoulder arthroscopy, debridement, SAD, DCR   Referring Provider Dr. Elsie Saas   Onset Date 07/28/17   Prior Therapy None     Precautions   Precautions Shoulder   Type of Shoulder Precautions P/ROM and progress as tolerated   Shoulder Interventions Shoulder sling/immobilizer;At all times  x2 weeks     Balance Screen   Has the patient fallen in the past 6 months No   Has the patient had a decrease in activity level because of a fear of falling?  No   Is the patient reluctant to leave their home because of a fear of falling?  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, lifting, pushing/pulling   Leisure camping, boating, outdoor work-gardening, planting flowers     ADL   ADL comments Pt is having difficulty with all ADL completion-dressing, bathing, grooming, meal preparation, lifting, reaching up overhead      Written Expression   Dominant Hand Right     Cognition   Overall Cognitive Status Within Functional Limits for tasks assessed     ROM / Strength   AROM / PROM / Strength AROM;PROM;Strength     Palpation   Palpation comment Moderate fascial restrictions in left upper arm, trapezius, and scapularis regions     AROM   Overall AROM  Unable to assess;Due to pain     PROM   Overall PROM Comments Assessed supine, er/IR adducted   PROM Assessment Site Shoulder   Right/Left Shoulder Left   Left Shoulder Flexion 75 Degrees   Left Shoulder ABduction 50 Degrees   Left  Shoulder Internal Rotation 90 Degrees   Left Shoulder External Rotation 30 Degrees     Strength   Overall Strength Unable to assess;Due to precautions;Due to pain                         OT Education - 08/01/17 1503    Education provided Yes   Education Details table slides   Person(s) Educated Patient   Methods Explanation;Demonstration;Handout   Comprehension Verbalized understanding;Returned demonstration          OT Short Term Goals - 08/01/17 1527      OT SHORT TERM GOAL #1   Title Pt will be educated and independent in HEP to improve LUE mobility.    Time 4   Period Weeks   Status New   Target Date 08/29/17     OT SHORT TERM GOAL #2   Title Pt will decrease pain in LUE to 5/10 during ADL completion.    Time 4   Period Weeks   Status New     OT SHORT TERM GOAL #3   Title Pt will improve LUE P/ROM to [facility] to increase ability to perform dressing tasks without compensatory strategies.    Time 4   Period Weeks   Status New     OT SHORT TERM GOAL #4   Title Pt will improve LUE strength to 3/5 to improve ability to reach for items at waist and shoulder level.    Time 4   Period Weeks   Status New           OT Long Term Goals - 08/01/17 1531      OT LONG TERM GOAL #1   Title Pt will return to highest level of functioning and independence in B/IADL completion using LUE as non-dominant.    Time 8   Period Weeks   Status New   Target Date 09/29/17     OT LONG TERM GOAL #2   Title Pt will decrease pain in LUE to 3/10 or less to improve use of LUE as non-dominant during B/ADL completion.   Time 8   Period Weeks   Status New     OT LONG TERM GOAL #3   Title Pt will decrease fascial restrictions in LUE from moderate to minimal to improve mobility required for functional reaching tasks.    Time 8   Period Weeks   Status New     OT LONG TERM GOAL #4   Title Pt will improve LUE A/ROM to [facility] to improve ability to  reach overhead and behind  back during bathing tasks.    Time 8   Period Weeks   Status New     OT LONG TERM GOAL #5   Title Pt will improve LUE strength to 4+/5 to improve ability to use LUE as non-dominant during work tasks.    Time 8   Period Weeks   Status New               Plan - 08-22-2017 1524    Clinical Impression Statement A: Pt is a 56 y/o female s/p left shoulder arthroscopy on 07/28/17, presenting with pain and limited functional use of LUE. Pt was educated on table slides during session, as well as compensatory techniques for ADL completion.    Occupational Profile and client history currently impacting functional performance Pt is motivated to return to highest level of functioning using LUE as non-dominant during B/ADL completion   Occupational performance deficits (Please refer to evaluation for details): ADL's;IADL's;Rest and Sleep;Work;Leisure;Social Participation   Rehab Potential Good   OT Frequency 2x / week   OT Duration 8 weeks   OT Treatment/Interventions Self-care/ADL training;Therapeutic exercise;Patient/family education;Ultrasound;Manual Therapy;Therapeutic activities;Cryotherapy;Electrical Stimulation;Passive range of motion;Moist Heat   Plan P: Pt will benefit from skilled OT services to decrease pain and fascial restrictions, increased ROM, strength, and functional use of LUE. Treatment plan: myofascial release, manual therapy, P/ROM, AA/ROM, A/ROM, general LUE strengthening, scapular mobility and strengthening, modalities prn   Clinical Decision Making Limited treatment options, no task modification necessary   OT Home Exercise Plan 08-23-23: table sildes   Consulted and Agree with Plan of Care Patient      Patient will benefit from skilled therapeutic intervention in order to improve the following deficits and impairments:  Decreased activity tolerance, Decreased strength, Impaired flexibility, Decreased range of motion, Pain, Impaired UE functional use, Increased fascial  restricitons  Visit Diagnosis: Acute pain of left shoulder  Stiffness of left shoulder, not elsewhere classified  Other symptoms and signs involving the musculoskeletal system      G-Codes - 2017-08-22 1537    Functional Assessment Tool Used (Outpatient only) FOTO Score: 4/100 (96% impairment)   Functional Limitation Carrying, moving and handling objects   Carrying, Moving and Handling Objects Current Status (E7517) At least 80 percent but less than 100 percent impaired, limited or restricted   Carrying, Moving and Handling Objects Goal Status (G0174) At least 40 percent but less than 60 percent impaired, limited or restricted      Problem List Patient Active Problem List   Diagnosis Date Noted  . Elevated transaminase level 09/21/2015  . Vitamin D deficiency 09/21/2015  . History of gastric bypass 09/21/2015  . Anemia, iron deficiency 09/21/2015  . Hypothyroidism 05/20/2014  . Rheumatoid arthritis ([place]) 07/11/2013  . Constipation 01/19/2011  . Abdominal discomfort in left lower quadrant 01/19/2011  . GERD 01/01/2010  . HIATAL HERNIA 01/01/2010  . ANAL FISTULA 01/01/2010  . ABDOMINAL PAIN, UNSPECIFIED SITE 01/01/2010   Guadelupe Sabin, OTR/L  [phone] Aug 22, 2017, 3:37 PM  [facility] [address] [place], [place], [address] Phone: [phone]   Fax:  [phone]  Name: [name] MRN: [id number] Date of Birth: Dec 08, 1960

## 2017-08-01 NOTE — Patient Instructions (Signed)
SHOULDER: Flexion On Table   Place hands on table, elbows straight. Move hips away from body. Press hands down into table.  _10-15__ reps per set, __2-3_ sets per day  Abduction (Passive)   With arm out to side, resting on table, lower head toward arm, keeping trunk away from table. Repeat __10-15__ times. Do _2-3___ sessions per day.  Copyright  VHI. All rights reserved.     Internal Rotation (Assistive)   Seated with elbow bent at right angle and held against side, slide arm on table surface in an inward arc. Repeat __10-15__ times. Do __2__ sessions per day. Activity: Use this motion to brush crumbs off the table.  Copyright  VHI. All rights reserved.

## 2017-08-02 ENCOUNTER — Encounter (HOSPITAL_COMMUNITY): Payer: Self-pay | Admitting: Occupational Therapy

## 2017-08-02 ENCOUNTER — Ambulatory Visit (HOSPITAL_COMMUNITY): Payer: 59 | Admitting: Occupational Therapy

## 2017-08-02 DIAGNOSIS — M25512 Pain in left shoulder: Secondary | ICD-10-CM | POA: Diagnosis not present

## 2017-08-02 DIAGNOSIS — M25612 Stiffness of left shoulder, not elsewhere classified: Secondary | ICD-10-CM

## 2017-08-02 DIAGNOSIS — R29898 Other symptoms and signs involving the musculoskeletal system: Secondary | ICD-10-CM

## 2017-08-02 NOTE — Therapy (Signed)
[place], [place], [address] Phone: [phone]   Fax:  [phone]  Occupational Therapy Treatment  Patient Details  Name: [name] MRN: [id number] Date of Birth: [birth year] Referring Provider: Dr. Elsie Saas  Encounter Date: 08/02/2017      OT End of Session - 08/02/17 1029    Visit Number 2   Number of Visits 16   Date for OT Re-Evaluation 09/30/17  Mini-reassessment 08/30/17   Authorization Type UHC   Authorization Time Period 60 visit limit combined, no visits used; $30 copay   Authorization - Visit Number 2   Authorization - Number of Visits 12   OT Start Time [phone]   OT Stop Time 1030   OT Time Calculation (min) 42 min   Activity Tolerance Patient tolerated treatment well   Behavior During Therapy [facility] for tasks assessed/performed      Past Medical History:  Diagnosis Date  . Abdominal pain of unknown etiology   . Anal fistula    hx of   . Arthritis   . Constipation    severe  . Depression   . GERD (gastroesophageal reflux disease)   . Hiatal hernia   . Hyperthyroidism   . Prediabetes   . S/P colonoscopy April 2011   Dr. Olevia Perches: mild diverticulosis, hyperplastic polyps, repeat in 10 years  . S/P endoscopy April 2011   Dr. Olevia Perches: no hiatal hernia, generous opening to antrum., patent gastrojejunostomy    Past Surgical History:  Procedure Laterality Date  . anal fistulostomy     w/marsupialization an excision of anal tags  . BREAST LUMPECTOMY     benign  . COLONOSCOPY    . ESOPHAGOGASTRODUODENOSCOPY    . GASTRIC BYPASS     mini gastric bypass in [place] reportedly    There were no vitals filed for this visit.      Subjective Assessment - 08/02/17 0951    Subjective  S: I didn't take any pain medication this morning.    Currently in Pain? Yes   Pain Score 2    Pain Location Shoulder   Pain Orientation Left   Pain Descriptors / Indicators Sore   Pain Type Acute  pain   Pain Radiating Towards n/a   Pain Onset In the past 7 days   Pain Frequency Constant   Aggravating Factors  movement   Pain Relieving Factors rest, ice, pain medication   Effect of Pain on Daily Activities Unable to use LUE for daily tasks   Multiple Pain Sites No            [facility] OT Assessment - 08/02/17 0950      Assessment   Diagnosis s/p Left shoulder arthroscopy, debridement, SAD, DCR     Precautions   Precautions Shoulder   Type of Shoulder Precautions P/ROM and progress as tolerated   Shoulder Interventions Shoulder sling/immobilizer;At all times  x2 weeks                  OT Treatments/Exercises (OP) - 08/02/17 0109      Exercises   Exercises Shoulder     Shoulder Exercises: Supine   Protraction PROM;10 reps   Horizontal ABduction PROM;10 reps   External Rotation PROM;10 reps   Internal Rotation PROM;10 reps   Flexion PROM;10 reps   ABduction PROM;10 reps     Shoulder Exercises: Seated   Elevation AROM;10 reps   Extension AROM;10 reps   Row AROM;10 reps  Shoulder Exercises: Therapy Ball   Flexion 10 reps   ABduction 10 reps     Manual Therapy   Manual Therapy Myofascial release   Manual therapy comments Completed separately from therapeutic exercise   Myofascial Release Myofascial release to left upper arm, trapezius, and scapularis regions to decrease pain and fascial restrictions and increase joint range of motion.                 OT Education - 08/02/17 1101    Education provided Yes   Education Details provided evaluation and reviewed goals   Person(s) Educated Patient   Methods Explanation;Handout   Comprehension Verbalized understanding          OT Short Term Goals - 08/02/17 1029      OT SHORT TERM GOAL #1   Title Pt will be educated and independent in HEP to improve LUE mobility.    Time 4   Period Weeks   Status On-going     OT SHORT TERM GOAL #2   Title Pt will decrease pain in LUE to 5/10 during  ADL completion.    Time 4   Period Weeks   Status On-going     OT SHORT TERM GOAL #3   Title Pt will improve LUE P/ROM to [facility] to increase ability to perform dressing tasks without compensatory strategies.    Time 4   Period Weeks   Status On-going     OT SHORT TERM GOAL #4   Title Pt will improve LUE strength to 3/5 to improve ability to reach for items at waist and shoulder level.    Time 4   Period Weeks   Status On-going           OT Long Term Goals - 08/02/17 1029      OT LONG TERM GOAL #1   Title Pt will return to highest level of functioning and independence in B/IADL completion using LUE as non-dominant.    Time 8   Period Weeks   Status On-going     OT LONG TERM GOAL #2   Title Pt will decrease pain in LUE to 3/10 or less to improve use of LUE as non-dominant during B/ADL completion.   Time 8   Period Weeks   Status On-going     OT LONG TERM GOAL #3   Title Pt will decrease fascial restrictions in LUE from moderate to minimal to improve mobility required for functional reaching tasks.    Time 8   Period Weeks   Status On-going     OT LONG TERM GOAL #4   Title Pt will improve LUE A/ROM to [facility] to improve ability to reach overhead and behind back during bathing tasks.    Time 8   Period Weeks   Status On-going     OT LONG TERM GOAL #5   Title Pt will improve LUE strength to 4+/5 to improve ability to use LUE as non-dominant during work tasks.    Time 8   Period Weeks   Status On-going               Plan - 08/02/17 1052    Clinical Impression Statement A: Initiated myofascial release, manual therapy, P/ROM, scapular A/ROM, and theraby ball stretches. Pt with improved range during passive stretching compared to evaluation. Verbal cuing for form, rest breaks as needed.    Plan P: Add isometrics in supine   OT Home Exercise Plan 10/16: table sildes   Consulted  and Agree with Plan of Care Patient      Patient will benefit from skilled therapeutic  intervention in order to improve the following deficits and impairments:  Decreased activity tolerance, Decreased strength, Impaired flexibility, Decreased range of motion, Pain, Impaired UE functional use, Increased fascial restricitons  Visit Diagnosis: Acute pain of left shoulder  Stiffness of left shoulder, not elsewhere classified  Other symptoms and signs involving the musculoskeletal system      G-Codes - Aug 28, 2017 1537    Functional Assessment Tool Used (Outpatient only) FOTO Score: 4/100 (96% impairment)   Functional Limitation Carrying, moving and handling objects   Carrying, Moving and Handling Objects Current Status (Y1950) At least 80 percent but less than 100 percent impaired, limited or restricted   Carrying, Moving and Handling Objects Goal Status (D3267) At least 40 percent but less than 60 percent impaired, limited or restricted      Problem List Patient Active Problem List   Diagnosis Date Noted  . Elevated transaminase level 09/21/2015  . Vitamin D deficiency 09/21/2015  . History of gastric bypass 09/21/2015  . Anemia, iron deficiency 09/21/2015  . Hypothyroidism 05/20/2014  . Rheumatoid arthritis ([place]) 07/11/2013  . Constipation 01/19/2011  . Abdominal discomfort in left lower quadrant 01/19/2011  . GERD 01/01/2010  . HIATAL HERNIA 01/01/2010  . ANAL FISTULA 01/01/2010  . ABDOMINAL PAIN, UNSPECIFIED SITE 01/01/2010   Guadelupe Sabin, OTR/L  [phone] 08/02/2017, 11:01 AM  [facility] [address] [place], [place], [address] Phone: [phone]   Fax:  [phone]  Name: NYKIAH MA MRN: [id number] Date of Birth: 1961-01-16

## 2017-08-08 ENCOUNTER — Ambulatory Visit (HOSPITAL_COMMUNITY): Payer: 59 | Admitting: Occupational Therapy

## 2017-08-08 ENCOUNTER — Encounter (HOSPITAL_COMMUNITY): Payer: Self-pay | Admitting: Occupational Therapy

## 2017-08-08 DIAGNOSIS — M25612 Stiffness of left shoulder, not elsewhere classified: Secondary | ICD-10-CM

## 2017-08-08 DIAGNOSIS — M25512 Pain in left shoulder: Secondary | ICD-10-CM

## 2017-08-08 DIAGNOSIS — R29898 Other symptoms and signs involving the musculoskeletal system: Secondary | ICD-10-CM

## 2017-08-08 NOTE — Therapy (Signed)
[place], [place], [address] Phone: [phone]   Fax:  [phone]  Occupational Therapy Treatment  Patient Details  Name: [name] MRN: [id number] Date of Birth: [birth year] Referring Provider: Dr. Elsie Saas  Encounter Date: 08/08/2017      OT End of Session - 08/08/17 1517    Visit Number 3   Number of Visits 16   Date for OT Re-Evaluation 09/30/17  Mini-reassessment 08/30/17   Authorization Type UHC   Authorization Time Period 60 visit limit combined, no visits used; $30 copay   Authorization - Visit Number 3   Authorization - Number of Visits 60   OT Start Time 3013   OT Stop Time 1515   OT Time Calculation (min) 43 min   Activity Tolerance Patient tolerated treatment well   Behavior During Therapy [facility] for tasks assessed/performed      Past Medical History:  Diagnosis Date  . Abdominal pain of unknown etiology   . Anal fistula    hx of   . Arthritis   . Constipation    severe  . Depression   . GERD (gastroesophageal reflux disease)   . Hiatal hernia   . Hyperthyroidism   . Prediabetes   . S/P colonoscopy April 2011   Dr. Olevia Perches: mild diverticulosis, hyperplastic polyps, repeat in 10 years  . S/P endoscopy April 2011   Dr. Olevia Perches: no hiatal hernia, generous opening to antrum., patent gastrojejunostomy    Past Surgical History:  Procedure Laterality Date  . anal fistulostomy     w/marsupialization an excision of anal tags  . BREAST LUMPECTOMY     benign  . COLONOSCOPY    . ESOPHAGOGASTRODUODENOSCOPY    . GASTRIC BYPASS     mini gastric bypass in [place] reportedly    There were no vitals filed for this visit.      Subjective Assessment - 08/08/17 1432    Subjective  S: The doctor told me after this week I can take the sling off during the day.    Currently in Pain? No/denies            [facility] OT Assessment - 08/08/17 1431      Assessment   Diagnosis s/p  Left shoulder arthroscopy, debridement, SAD, DCR     Precautions   Precautions Shoulder   Type of Shoulder Precautions P/ROM and progress as tolerated   Shoulder Interventions Shoulder sling/immobilizer;At all times  x2 weeks                  OT Treatments/Exercises (OP) - 08/08/17 1435      Exercises   Exercises Shoulder     Shoulder Exercises: Supine   Protraction PROM;10 reps   Horizontal ABduction PROM;10 reps   External Rotation PROM;10 reps   Internal Rotation PROM;10 reps   Flexion PROM;10 reps   ABduction PROM;10 reps     Shoulder Exercises: Seated   Elevation AROM;10 reps   Extension AROM;10 reps   Row AROM;10 reps     Shoulder Exercises: Therapy Ball   Flexion 10 reps   ABduction 10 reps     Shoulder Exercises: ROM/Strengthening   Thumb Tacks 1' low level   Caudal Glide 3X10"     Shoulder Exercises: Isometric Strengthening   Flexion Supine;3X3"   Extension Supine;3X3"   External Rotation Supine;3X3"   Internal Rotation Supine;3X3"   ABduction Supine;3X3"   ADduction Supine;3X3"     Manual Therapy  Manual Therapy Myofascial release   Manual therapy comments Completed separately from therapeutic exercise   Myofascial Release Myofascial release to left upper arm, trapezius, and scapularis regions to decrease pain and fascial restrictions and increase joint range of motion.                 OT Education - 08/08/17 1502    Education provided Yes   Education Details scapular A/ROM   Person(s) Educated Patient   Methods Explanation;Demonstration;Handout   Comprehension Verbalized understanding;Returned demonstration          OT Short Term Goals - 08/02/17 1029      OT SHORT TERM GOAL #1   Title Pt will be educated and independent in HEP to improve LUE mobility.    Time 4   Period Weeks   Status On-going     OT SHORT TERM GOAL #2   Title Pt will decrease pain in LUE to 5/10 during ADL completion.    Time 4   Period Weeks    Status On-going     OT SHORT TERM GOAL #3   Title Pt will improve LUE P/ROM to [facility] to increase ability to perform dressing tasks without compensatory strategies.    Time 4   Period Weeks   Status On-going     OT SHORT TERM GOAL #4   Title Pt will improve LUE strength to 3/5 to improve ability to reach for items at waist and shoulder level.    Time 4   Period Weeks   Status On-going           OT Long Term Goals - 08/02/17 1029      OT LONG TERM GOAL #1   Title Pt will return to highest level of functioning and independence in B/IADL completion using LUE as non-dominant.    Time 8   Period Weeks   Status On-going     OT LONG TERM GOAL #2   Title Pt will decrease pain in LUE to 3/10 or less to improve use of LUE as non-dominant during B/ADL completion.   Time 8   Period Weeks   Status On-going     OT LONG TERM GOAL #3   Title Pt will decrease fascial restrictions in LUE from moderate to minimal to improve mobility required for functional reaching tasks.    Time 8   Period Weeks   Status On-going     OT LONG TERM GOAL #4   Title Pt will improve LUE A/ROM to [facility] to improve ability to reach overhead and behind back during bathing tasks.    Time 8   Period Weeks   Status On-going     OT LONG TERM GOAL #5   Title Pt will improve LUE strength to 4+/5 to improve ability to use LUE as non-dominant during work tasks.    Time 8   Period Weeks   Status On-going               Plan - 08/08/17 1510    Clinical Impression Statement A: Continued with manual therapy, pt with P/ROM [facility] this session. Added isometrics in supine and thumb tacks this session, verbal cuing for form and technique. Pt reports MD will allow her to discontinue her sling after this week. Pt is completing HEP at home.    Plan P: Continue with increasing P/ROM, add prot/ret/elev/dep at low level, increase isometrics to 3x5"   OT Home Exercise Plan 10/16: table sildes; 10/23: scapular A/ROM  Consulted  and Agree with Plan of Care Patient      Patient will benefit from skilled therapeutic intervention in order to improve the following deficits and impairments:  Decreased activity tolerance, Decreased strength, Impaired flexibility, Decreased range of motion, Pain, Impaired UE functional use, Increased fascial restricitons  Visit Diagnosis: Acute pain of left shoulder  Stiffness of left shoulder, not elsewhere classified  Other symptoms and signs involving the musculoskeletal system    Problem List Patient Active Problem List   Diagnosis Date Noted  . Elevated transaminase level 09/21/2015  . Vitamin D deficiency 09/21/2015  . History of gastric bypass 09/21/2015  . Anemia, iron deficiency 09/21/2015  . Hypothyroidism 05/20/2014  . Rheumatoid arthritis ([place]) 07/11/2013  . Constipation 01/19/2011  . Abdominal discomfort in left lower quadrant 01/19/2011  . GERD 01/01/2010  . HIATAL HERNIA 01/01/2010  . ANAL FISTULA 01/01/2010  . ABDOMINAL PAIN, UNSPECIFIED SITE 01/01/2010   Guadelupe Sabin, OTR/L  [phone] 08/08/2017, 3:17 PM  [facility] [address] [place], [place], [address] Phone: [phone]   Fax:  [phone]  Name: [name] MRN: [id number] Date of Birth: 11-28-1960

## 2017-08-08 NOTE — Patient Instructions (Signed)
1) Seated Row   Sit up straight with elbows by your sides. Pull back with shoulders/elbows, keeping forearms straight, as if pulling back on the reins of a horse. Squeeze shoulder blades together. Repeat _10__times, __2__sets/day    2) Shoulder Elevation    Sit up straight with arms by your sides. Slowly bring your shoulders up towards your ears. Repeat_10__times, __2__ sets/day    3) Shoulder Extension    Sit up straight with both arms by your side, draw your arms back behind your waist. Keep your elbows straight. Repeat __10__times, _2___sets/day.        

## 2017-08-10 ENCOUNTER — Ambulatory Visit (HOSPITAL_COMMUNITY): Payer: 59

## 2017-08-10 ENCOUNTER — Encounter (HOSPITAL_COMMUNITY): Payer: Self-pay

## 2017-08-10 DIAGNOSIS — M25612 Stiffness of left shoulder, not elsewhere classified: Secondary | ICD-10-CM

## 2017-08-10 DIAGNOSIS — M25512 Pain in left shoulder: Secondary | ICD-10-CM

## 2017-08-10 DIAGNOSIS — R29898 Other symptoms and signs involving the musculoskeletal system: Secondary | ICD-10-CM

## 2017-08-10 NOTE — Therapy (Signed)
[place], [place], [address] Phone: [phone]   Fax:  [phone]  Occupational Therapy Treatment  Patient Details  Name: SHRAVYA WICKWIRE MRN: [id number] Date of Birth: November 25, 1960 Referring Provider: Dr. Elsie Saas  Encounter Date: 08/10/2017      OT End of Session - 08/10/17 1026    Visit Number 4   Number of Visits 16   Date for OT Re-Evaluation 09/30/17  Mini-reassessment 08/30/17   Authorization Type UHC   Authorization Time Period 60 visit limit combined, no visits used; $30 copay   Authorization - Visit Number 4   Authorization - Number of Visits 21   OT Start Time 0825  Pt arrived late   OT Stop Time 0900   OT Time Calculation (min) 35 min   Activity Tolerance Patient tolerated treatment well   Behavior During Therapy [facility] for tasks assessed/performed      Past Medical History:  Diagnosis Date  . Abdominal pain of unknown etiology   . Anal fistula    hx of   . Arthritis   . Constipation    severe  . Depression   . GERD (gastroesophageal reflux disease)   . Hiatal hernia   . Hyperthyroidism   . Prediabetes   . S/P colonoscopy April 2011   Dr. Olevia Perches: mild diverticulosis, hyperplastic polyps, repeat in 10 years  . S/P endoscopy April 2011   Dr. Olevia Perches: no hiatal hernia, generous opening to antrum., patent gastrojejunostomy    Past Surgical History:  Procedure Laterality Date  . anal fistulostomy     w/marsupialization an excision of anal tags  . BREAST LUMPECTOMY     benign  . COLONOSCOPY    . ESOPHAGOGASTRODUODENOSCOPY    . GASTRIC BYPASS     mini gastric bypass in [place] reportedly    There were no vitals filed for this visit.      Subjective Assessment - 08/10/17 1019    Subjective  S: No complaints of pain really. Maybe a 2/10.   Currently in Pain? Yes   Pain Score 2    Pain Location Shoulder   Pain Orientation Left   Pain Descriptors / Indicators Sore   Pain  Type Acute pain   Pain Radiating Towards N/A   Pain Onset In the past 7 days   Pain Frequency Constant   Aggravating Factors  movement   Pain Relieving Factors rest, ice, pain medication   Effect of Pain on Daily Activities Unable to use LUE for daily tasks   Multiple Pain Sites No            [facility] OT Assessment - 08/10/17 1023      Assessment   Diagnosis s/p Left shoulder arthroscopy, debridement, SAD, DCR     Precautions   Precautions Shoulder   Type of Shoulder Precautions P/ROM and progress as tolerated   Shoulder Interventions Shoulder sling/immobilizer;At all times  x2 weeks                  OT Treatments/Exercises (OP) - 08/10/17 0845      Exercises   Exercises Shoulder     Shoulder Exercises: Supine   Protraction PROM;10 reps   Horizontal ABduction PROM;10 reps   External Rotation PROM;10 reps   Internal Rotation PROM;10 reps   Flexion PROM;10 reps   ABduction PROM;10 reps     Shoulder Exercises: Seated   Extension AROM;10 reps   Row AROM;10 reps   Other  Seated Exercises Scapular depression; 10X A/ROM     Shoulder Exercises: Therapy Ball   Flexion 10 reps   ABduction 10 reps     Shoulder Exercises: ROM/Strengthening   Prot/Ret//Elev/Dep 1'     Shoulder Exercises: Isometric Strengthening   Flexion Supine;3X5"   Extension Supine;3X5"   External Rotation Supine;3X5"   Internal Rotation Supine;3X5"   ABduction Supine;3X5"   ADduction Supine;3X5"     Manual Therapy   Manual Therapy Myofascial release   Manual therapy comments Completed separately from therapeutic exercise   Myofascial Release Myofascial release to left upper arm, trapezius, and scapularis regions to decrease pain and fascial restrictions and increase joint range of motion.                   OT Short Term Goals - 08/02/17 1029      OT SHORT TERM GOAL #1   Title Pt will be educated and independent in HEP to improve LUE mobility.    Time 4   Period Weeks    Status On-going     OT SHORT TERM GOAL #2   Title Pt will decrease pain in LUE to 5/10 during ADL completion.    Time 4   Period Weeks   Status On-going     OT SHORT TERM GOAL #3   Title Pt will improve LUE P/ROM to [facility] to increase ability to perform dressing tasks without compensatory strategies.    Time 4   Period Weeks   Status On-going     OT SHORT TERM GOAL #4   Title Pt will improve LUE strength to 3/5 to improve ability to reach for items at waist and shoulder level.    Time 4   Period Weeks   Status On-going           OT Long Term Goals - 08/02/17 1029      OT LONG TERM GOAL #1   Title Pt will return to highest level of functioning and independence in B/IADL completion using LUE as non-dominant.    Time 8   Period Weeks   Status On-going     OT LONG TERM GOAL #2   Title Pt will decrease pain in LUE to 3/10 or less to improve use of LUE as non-dominant during B/ADL completion.   Time 8   Period Weeks   Status On-going     OT LONG TERM GOAL #3   Title Pt will decrease fascial restrictions in LUE from moderate to minimal to improve mobility required for functional reaching tasks.    Time 8   Period Weeks   Status On-going     OT LONG TERM GOAL #4   Title Pt will improve LUE A/ROM to [facility] to improve ability to reach overhead and behind back during bathing tasks.    Time 8   Period Weeks   Status On-going     OT LONG TERM GOAL #5   Title Pt will improve LUE strength to 4+/5 to improve ability to use LUE as non-dominant during work tasks.    Time 8   Period Weeks   Status On-going               Plan - 08/10/17 1026    Clinical Impression Statement A: Able to increase isometrics to 3x5" this session and added pro/ret/elev/dep. Patient required VC for form and technique.    Plan P: Increase therapy ball repetitions.      Patient will benefit from skilled  therapeutic intervention in order to improve the following deficits and impairments:   Decreased activity tolerance, Decreased strength, Impaired flexibility, Decreased range of motion, Pain, Impaired UE functional use, Increased fascial restricitons  Visit Diagnosis: Stiffness of left shoulder, not elsewhere classified  Other symptoms and signs involving the musculoskeletal system  Acute pain of left shoulder    Problem List Patient Active Problem List   Diagnosis Date Noted  . Elevated transaminase level 09/21/2015  . Vitamin D deficiency 09/21/2015  . History of gastric bypass 09/21/2015  . Anemia, iron deficiency 09/21/2015  . Hypothyroidism 05/20/2014  . Rheumatoid arthritis ([place]) 07/11/2013  . Constipation 01/19/2011  . Abdominal discomfort in left lower quadrant 01/19/2011  . GERD 01/01/2010  . HIATAL HERNIA 01/01/2010  . ANAL FISTULA 01/01/2010  . ABDOMINAL PAIN, UNSPECIFIED SITE 01/01/2010   [name], OTR/L,CBIS  [phone]  08/10/2017, 11:51 AM  [facility] [address] [place], [place], [address] Phone: [phone]   Fax:  [phone]  Name: IANNA SALMELA MRN: [id number] Date of Birth: 19-Aug-1961

## 2017-08-16 ENCOUNTER — Ambulatory Visit (HOSPITAL_COMMUNITY): Payer: 59 | Admitting: Occupational Therapy

## 2017-08-16 ENCOUNTER — Encounter (HOSPITAL_COMMUNITY): Payer: Self-pay | Admitting: Occupational Therapy

## 2017-08-16 DIAGNOSIS — M25612 Stiffness of left shoulder, not elsewhere classified: Secondary | ICD-10-CM

## 2017-08-16 DIAGNOSIS — M25512 Pain in left shoulder: Secondary | ICD-10-CM

## 2017-08-16 DIAGNOSIS — R29898 Other symptoms and signs involving the musculoskeletal system: Secondary | ICD-10-CM

## 2017-08-16 NOTE — Therapy (Signed)
[place], [place], [address] Phone: [phone]   Fax:  [phone]  Occupational Therapy Treatment  Patient Details  Name: [name] MRN: [id number] Date of Birth: 04/06/1961 Referring Provider: Dr. Elsie Saas  Encounter Date: 08/16/2017      OT End of Session - 08/16/17 1137    Visit Number 5   Number of Visits 16   Date for OT Re-Evaluation 09/30/17  Mini-reassessment 08/30/17   Authorization Type UHC   Authorization Time Period 60 visit limit combined, no visits used; $30 copay   Authorization - Visit Number 5   Authorization - Number of Visits 60   OT Start Time 1035   OT Stop Time 1117   OT Time Calculation (min) 42 min   Activity Tolerance Patient tolerated treatment well   Behavior During Therapy [facility] for tasks assessed/performed      Past Medical History:  Diagnosis Date  . Abdominal pain of unknown etiology   . Anal fistula    hx of   . Arthritis   . Constipation    severe  . Depression   . GERD (gastroesophageal reflux disease)   . Hiatal hernia   . Hyperthyroidism   . Prediabetes   . S/P colonoscopy April 2011   Dr. Olevia Perches: mild diverticulosis, hyperplastic polyps, repeat in 10 years  . S/P endoscopy April 2011   Dr. Olevia Perches: no hiatal hernia, generous opening to antrum., patent gastrojejunostomy    Past Surgical History:  Procedure Laterality Date  . anal fistulostomy     w/marsupialization an excision of anal tags  . BREAST LUMPECTOMY     benign  . COLONOSCOPY    . ESOPHAGOGASTRODUODENOSCOPY    . GASTRIC BYPASS     mini gastric bypass in [place] reportedly    There were no vitals filed for this visit.      Subjective Assessment - 08/16/17 1038    Subjective  S: I'm only wearing the sling out in town now.    Currently in Pain? Yes   Pain Score 2    Pain Location Shoulder   Pain Orientation Left   Pain Descriptors / Indicators Sore   Pain Type Acute pain    Pain Radiating Towards n/a   Pain Onset 1 to 4 weeks ago   Pain Frequency Constant   Aggravating Factors  movement   Pain Relieving Factors rest, ice, pain medication   Effect of Pain on Daily Activities max effect on use of LUE for daily tasks   Multiple Pain Sites No            [facility] OT Assessment - 08/16/17 1037      Assessment   Diagnosis s/p Left shoulder arthroscopy, debridement, SAD, DCR     Precautions   Precautions Shoulder   Type of Shoulder Precautions P/ROM and progress as tolerated                  OT Treatments/Exercises (OP) - 08/16/17 1040      Exercises   Exercises Shoulder     Shoulder Exercises: Supine   Protraction PROM;10 reps   Horizontal ABduction PROM;10 reps   External Rotation PROM;10 reps   Internal Rotation PROM;10 reps   Flexion PROM;10 reps   ABduction PROM;10 reps     Shoulder Exercises: Seated   Elevation AROM;15 reps   Extension AROM;15 reps   Row AROM;15 reps     Shoulder Exercises: Therapy Diona Foley  Flexion 15 reps   ABduction 15 reps     Shoulder Exercises: Isometric Strengthening   Flexion Supine;5X5"   Extension Supine;5X5"   External Rotation Supine;5X5"   Internal Rotation Supine;5X5"   ABduction Supine;5X5"   ADduction Supine;5X5"     Manual Therapy   Manual Therapy Myofascial release   Manual therapy comments Completed separately from therapeutic exercise   Myofascial Release Myofascial release to left upper arm, trapezius, and scapularis regions to decrease pain and fascial restrictions and increase joint range of motion.                   OT Short Term Goals - 08/02/17 1029      OT SHORT TERM GOAL #1   Title Pt will be educated and independent in HEP to improve LUE mobility.    Time 4   Period Weeks   Status On-going     OT SHORT TERM GOAL #2   Title Pt will decrease pain in LUE to 5/10 during ADL completion.    Time 4   Period Weeks   Status On-going     OT SHORT TERM GOAL #3    Title Pt will improve LUE P/ROM to [facility] to increase ability to perform dressing tasks without compensatory strategies.    Time 4   Period Weeks   Status On-going     OT SHORT TERM GOAL #4   Title Pt will improve LUE strength to 3/5 to improve ability to reach for items at waist and shoulder level.    Time 4   Period Weeks   Status On-going           OT Long Term Goals - 08/02/17 1029      OT LONG TERM GOAL #1   Title Pt will return to highest level of functioning and independence in B/IADL completion using LUE as non-dominant.    Time 8   Period Weeks   Status On-going     OT LONG TERM GOAL #2   Title Pt will decrease pain in LUE to 3/10 or less to improve use of LUE as non-dominant during B/ADL completion.   Time 8   Period Weeks   Status On-going     OT LONG TERM GOAL #3   Title Pt will decrease fascial restrictions in LUE from moderate to minimal to improve mobility required for functional reaching tasks.    Time 8   Period Weeks   Status On-going     OT LONG TERM GOAL #4   Title Pt will improve LUE A/ROM to [facility] to improve ability to reach overhead and behind back during bathing tasks.    Time 8   Period Weeks   Status On-going     OT LONG TERM GOAL #5   Title Pt will improve LUE strength to 4+/5 to improve ability to use LUE as non-dominant during work tasks.    Time 8   Period Weeks   Status On-going               Plan - 08/16/17 1106    Clinical Impression Statement A: Increased isometrics to 5x5", increased scapular A/ROM and therapy ball repetitions to 15. Verbal cuing for form and technique. Pt reports she is now only wearing sling when out in the community.    Plan P: Complete isometrics in standing and provide isometrics for HEP    OT Home Exercise Plan 10/16: table sildes; 10/23: scapular A/ROM   Consulted and Agree  with Plan of Care Patient      Patient will benefit from skilled therapeutic intervention in order to improve the following  deficits and impairments:  Decreased activity tolerance, Decreased strength, Impaired flexibility, Decreased range of motion, Pain, Impaired UE functional use, Increased fascial restricitons  Visit Diagnosis: Stiffness of left shoulder, not elsewhere classified  Other symptoms and signs involving the musculoskeletal system  Acute pain of left shoulder    Problem List Patient Active Problem List   Diagnosis Date Noted  . Elevated transaminase level 09/21/2015  . Vitamin D deficiency 09/21/2015  . History of gastric bypass 09/21/2015  . Anemia, iron deficiency 09/21/2015  . Hypothyroidism 05/20/2014  . Rheumatoid arthritis ([place]) 07/11/2013  . Constipation 01/19/2011  . Abdominal discomfort in left lower quadrant 01/19/2011  . GERD 01/01/2010  . HIATAL HERNIA 01/01/2010  . ANAL FISTULA 01/01/2010  . ABDOMINAL PAIN, UNSPECIFIED SITE 01/01/2010   Guadelupe Sabin, OTR/L  [phone] 08/16/2017, 11:39 AM  [place] [address] [place], [place], [address] Phone: [phone]   Fax:  [phone]  Name: [name] MRN: [id number] Date of Birth: [birth year]

## 2017-08-18 ENCOUNTER — Ambulatory Visit (HOSPITAL_COMMUNITY): Payer: 59 | Attending: Orthopedic Surgery | Admitting: Occupational Therapy

## 2017-08-18 ENCOUNTER — Encounter (HOSPITAL_COMMUNITY): Payer: Self-pay | Admitting: Occupational Therapy

## 2017-08-18 DIAGNOSIS — M25612 Stiffness of left shoulder, not elsewhere classified: Secondary | ICD-10-CM | POA: Diagnosis not present

## 2017-08-18 DIAGNOSIS — M25512 Pain in left shoulder: Secondary | ICD-10-CM | POA: Diagnosis not present

## 2017-08-18 DIAGNOSIS — R29898 Other symptoms and signs involving the musculoskeletal system: Secondary | ICD-10-CM | POA: Diagnosis not present

## 2017-08-18 NOTE — Therapy (Signed)
[place], [place], [address] Phone: [phone]   Fax:  [phone]  Occupational Therapy Treatment  Patient Details  Name: [name] MRN: [id number] Date of Birth: October 03, 1961 Referring Provider: Dr. Elsie Saas  Encounter Date: 08/18/2017      OT End of Session - 08/18/17 1053    Visit Number 6   Number of Visits 16   Date for OT Re-Evaluation 09/30/17  Mini-reassessment 08/30/17   Authorization Type UHC   Authorization Time Period 60 visit limit combined, no visits used; $30 copay   Authorization - Visit Number 6   Authorization - Number of Visits 35   OT Start Time [phone]  pt arrived late   OT Stop Time 1031   OT Time Calculation (min) 38 min   Activity Tolerance Patient tolerated treatment well   Behavior During Therapy [facility] for tasks assessed/performed      Past Medical History:  Diagnosis Date  . Abdominal pain of unknown etiology   . Anal fistula    hx of   . Arthritis   . Constipation    severe  . Depression   . GERD (gastroesophageal reflux disease)   . Hiatal hernia   . Hyperthyroidism   . Prediabetes   . S/P colonoscopy April 2011   Dr. Olevia Perches: mild diverticulosis, hyperplastic polyps, repeat in 10 years  . S/P endoscopy April 2011   Dr. Olevia Perches: no hiatal hernia, generous opening to antrum., patent gastrojejunostomy    Past Surgical History:  Procedure Laterality Date  . anal fistulostomy     w/marsupialization an excision of anal tags  . BREAST LUMPECTOMY     benign  . COLONOSCOPY    . ESOPHAGOGASTRODUODENOSCOPY    . GASTRIC BYPASS     mini gastric bypass in [place] reportedly    There were no vitals filed for this visit.      Subjective Assessment - 08/18/17 0956    Subjective  S: I don't know why this thing is so tight sometimes.    Currently in Pain? No/denies            [facility] OT Assessment - 08/18/17 0956      Assessment   Diagnosis s/p Left  shoulder arthroscopy, debridement, SAD, DCR     Precautions   Precautions Shoulder   Type of Shoulder Precautions P/ROM and progress as tolerated                  OT Treatments/Exercises (OP) - 08/18/17 0957      Exercises   Exercises Shoulder     Shoulder Exercises: Supine   Protraction PROM;10 reps   Horizontal ABduction PROM;10 reps   External Rotation PROM;10 reps   Internal Rotation PROM;10 reps   Flexion PROM;10 reps   ABduction PROM;10 reps     Shoulder Exercises: Seated   Elevation AROM;15 reps   Extension AROM;15 reps   Row AROM;15 reps     Shoulder Exercises: ROM/Strengthening   Wall Wash 1' low level   Thumb Tacks 1' low level with arms extended     Shoulder Exercises: Isometric Strengthening   Flexion --  standing 3X10"   Extension --  standing 3X10"   External Rotation --  standing 3X10"   Internal Rotation --  standing 3X10"   ABduction --  standing 3X10"   ADduction --  standing 3X10"     Manual Therapy   Manual Therapy Myofascial release   Manual  therapy comments Completed separately from therapeutic exercise   Myofascial Release Myofascial release to left upper arm, trapezius, and scapularis regions to decrease pain and fascial restrictions and increase joint range of motion.                 OT Education - 08/18/17 1014    Education provided Yes   Education Details isometrics   Person(s) Educated Patient   Methods Explanation;Demonstration;Handout   Comprehension Verbalized understanding;Returned demonstration          OT Short Term Goals - 08/02/17 1029      OT SHORT TERM GOAL #1   Title Pt will be educated and independent in HEP to improve LUE mobility.    Time 4   Period Weeks   Status On-going     OT SHORT TERM GOAL #2   Title Pt will decrease pain in LUE to 5/10 during ADL completion.    Time 4   Period Weeks   Status On-going     OT SHORT TERM GOAL #3   Title Pt will improve LUE P/ROM to [facility] to  increase ability to perform dressing tasks without compensatory strategies.    Time 4   Period Weeks   Status On-going     OT SHORT TERM GOAL #4   Title Pt will improve LUE strength to 3/5 to improve ability to reach for items at waist and shoulder level.    Time 4   Period Weeks   Status On-going           OT Long Term Goals - 08/02/17 1029      OT LONG TERM GOAL #1   Title Pt will return to highest level of functioning and independence in B/IADL completion using LUE as non-dominant.    Time 8   Period Weeks   Status On-going     OT LONG TERM GOAL #2   Title Pt will decrease pain in LUE to 3/10 or less to improve use of LUE as non-dominant during B/ADL completion.   Time 8   Period Weeks   Status On-going     OT LONG TERM GOAL #3   Title Pt will decrease fascial restrictions in LUE from moderate to minimal to improve mobility required for functional reaching tasks.    Time 8   Period Weeks   Status On-going     OT LONG TERM GOAL #4   Title Pt will improve LUE A/ROM to [facility] to improve ability to reach overhead and behind back during bathing tasks.    Time 8   Period Weeks   Status On-going     OT LONG TERM GOAL #5   Title Pt will improve LUE strength to 4+/5 to improve ability to use LUE as non-dominant during work tasks.    Time 8   Period Weeks   Status On-going               Plan - 08/18/17 1053    Clinical Impression Statement A: Added standing isometrics and updated for HEP, initialy verbal cuing for form and technique. Pt able to tolerate P/ROM [facility] this session, added wall wash and continued thumb tacks with arms/elbow extended. Pt reports HEP is going well.    Plan P: Follow up on HEP. Add pulleys and begin AA/ROM in supine if able to tolerate and complete with good form   OT Home Exercise Plan 10/16: table sildes; 10/23: scapular A/ROM   Consulted and Agree with Plan of  Care Patient      Patient will benefit from skilled therapeutic intervention  in order to improve the following deficits and impairments:  Decreased activity tolerance, Decreased strength, Impaired flexibility, Decreased range of motion, Pain, Impaired UE functional use, Increased fascial restricitons  Visit Diagnosis: Stiffness of left shoulder, not elsewhere classified  Other symptoms and signs involving the musculoskeletal system  Acute pain of left shoulder    Problem List Patient Active Problem List   Diagnosis Date Noted  . Elevated transaminase level 09/21/2015  . Vitamin D deficiency 09/21/2015  . History of gastric bypass 09/21/2015  . Anemia, iron deficiency 09/21/2015  . Hypothyroidism 05/20/2014  . Rheumatoid arthritis ([place]) 07/11/2013  . Constipation 01/19/2011  . Abdominal discomfort in left lower quadrant 01/19/2011  . GERD 01/01/2010  . HIATAL HERNIA 01/01/2010  . ANAL FISTULA 01/01/2010  . ABDOMINAL PAIN, UNSPECIFIED SITE 01/01/2010   Guadelupe Sabin, OTR/L  [phone] 08/18/2017, 10:57 AM  [place] [address] [place], [place], [address] Phone: [phone]   Fax:  [phone]  Name: [name] MRN: [id number] Date of Birth: 11-24-1960

## 2017-08-18 NOTE — Patient Instructions (Signed)
Shoulder Isometric Exercises  Complete 5x each, holding for 10 seconds. Complete 2-3x/day.   1) Shoulder Flexion Standing up, place a towel between your arm/wrist and the wall. Press your arm forward into the wall gently, without moving your shoulder.     2) Shoulder Extension Standing with your back against a wall. Bend elbow to 90 degrees and place a towel between your bent elbow and the wall.  Press your elbow back into the wall gently, without moving your shoulder.        3) Shoulder Internal Rotation Standing just behind a doorway, place your affected hand inside the doorway so that your palm is facing the door jam.  Keeping your elbow at your side and bend to 90 degrees, slowly press your hand into the door jam.  Do not move shoulder or allow elbow to leave your side.     4) Shoulder External Rotation Standing in a doorway or to the side of a wall, Place the affected hand/wrist against the wall.  Keeping your elbow bent at 90 degrees and close to your body, press your hand/wrist outward against the wall.  Do not move your shoulder or elbow.          5) Shoulder Adduction Standing just behind a doorway, place a towel between the inside of your elbow and the doorframe. Keeping your elbow bent at 90 degrees and close to your side, gently press your elbow into the doorframe.  Do not move shoulder.     6) Shoulder Abduction Standing to the side of a wall, place a towel between your arm and the wall.  Press your arm into the wall gently.  Do not move your shoulder.

## 2017-08-22 ENCOUNTER — Encounter (HOSPITAL_COMMUNITY): Payer: Self-pay | Admitting: Occupational Therapy

## 2017-08-22 ENCOUNTER — Ambulatory Visit (HOSPITAL_COMMUNITY): Payer: 59 | Admitting: Occupational Therapy

## 2017-08-22 DIAGNOSIS — M25512 Pain in left shoulder: Secondary | ICD-10-CM

## 2017-08-22 DIAGNOSIS — M25612 Stiffness of left shoulder, not elsewhere classified: Secondary | ICD-10-CM | POA: Diagnosis not present

## 2017-08-22 DIAGNOSIS — R29898 Other symptoms and signs involving the musculoskeletal system: Secondary | ICD-10-CM

## 2017-08-22 NOTE — Therapy (Signed)
[place], [place], [address] Phone: [phone]   Fax:  [phone]  Occupational Therapy Treatment  Patient Details  Name: [name] MRN: [id number] Date of Birth: 04/28/1961 Referring Provider: Dr. Elsie Saas   Encounter Date: 08/22/2017  OT End of Session - 08/22/17 1034    Visit Number  7    Number of Visits  16    Date for OT Re-Evaluation  09/30/17 Mini-reassessment 08/30/17     Authorization Type  UHC    Authorization Time Period  60 visit limit combined, no visits used; $30 copay    Authorization - Visit Number  7    Authorization - Number of Visits  84    OT Start Time  [phone] pt arrived late     OT Stop Time  1032    OT Time Calculation (min)  38 min    Activity Tolerance  Patient tolerated treatment well    Behavior During Therapy  [facility] for tasks assessed/performed       Past Medical History:  Diagnosis Date  . Abdominal pain of unknown etiology   . Anal fistula    hx of   . Arthritis   . Constipation    severe  . Depression   . GERD (gastroesophageal reflux disease)   . Hiatal hernia   . Hyperthyroidism   . Prediabetes   . S/P colonoscopy April 2011   Dr. Olevia Perches: mild diverticulosis, hyperplastic polyps, repeat in 10 years  . S/P endoscopy April 2011   Dr. Olevia Perches: no hiatal hernia, generous opening to antrum., patent gastrojejunostomy    Past Surgical History:  Procedure Laterality Date  . anal fistulostomy     w/marsupialization an excision of anal tags  . BREAST LUMPECTOMY     benign  . COLONOSCOPY    . ESOPHAGOGASTRODUODENOSCOPY    . GASTRIC BYPASS     mini gastric bypass in [place] reportedly    There were no vitals filed for this visit.  Subjective Assessment - 08/22/17 0956    Subjective   S: It's been sore the past couple of days, it's not right now though.     Currently in Pain?  No/denies         [facility] OT Assessment - 08/22/17 0955      Assessment   Diagnosis  s/p Left shoulder arthroscopy, debridement, SAD, DCR      Precautions   Precautions  Shoulder    Type of Shoulder Precautions  P/ROM and progress as tolerated               OT Treatments/Exercises (OP) - 08/22/17 1013      Exercises   Exercises  Shoulder      Shoulder Exercises: Supine   Protraction  PROM;5 reps;AAROM;10 reps    Horizontal ABduction  PROM;5 reps;AAROM;10 reps    External Rotation  PROM;5 reps;AAROM;10 reps    Internal Rotation  PROM;5 reps;AAROM;10 reps    Flexion  PROM;5 reps;AAROM;10 reps    ABduction  PROM;5 reps;AAROM;10 reps      Shoulder Exercises: Pulleys   Flexion  1 minute    ABduction  1 minute      Shoulder Exercises: ROM/Strengthening   Wall Wash  1'    Thumb Tacks  1'      Manual Therapy   Manual Therapy  Myofascial release    Manual therapy comments  Completed separately from therapeutic exercise    Myofascial Release  Myofascial release to left upper arm, trapezius, and scapularis regions to decrease pain and fascial restrictions and increase joint range of motion.                OT Short Term Goals - 08/02/17 1029      OT SHORT TERM GOAL #1   Title  Pt will be educated and independent in HEP to improve LUE mobility.     Time  4    Period  Weeks    Status  On-going      OT SHORT TERM GOAL #2   Title  Pt will decrease pain in LUE to 5/10 during ADL completion.     Time  4    Period  Weeks    Status  On-going      OT SHORT TERM GOAL #3   Title  Pt will improve LUE P/ROM to [facility] to increase ability to perform dressing tasks without compensatory strategies.     Time  4    Period  Weeks    Status  On-going      OT SHORT TERM GOAL #4   Title  Pt will improve LUE strength to 3/5 to improve ability to reach for items at waist and shoulder level.     Time  4    Period  Weeks    Status  On-going        OT Long Term Goals - 08/02/17 1029      OT LONG TERM GOAL #1   Title  Pt will return to highest level  of functioning and independence in B/IADL completion using LUE as non-dominant.     Time  8    Period  Weeks    Status  On-going      OT LONG TERM GOAL #2   Title  Pt will decrease pain in LUE to 3/10 or less to improve use of LUE as non-dominant during B/ADL completion.    Time  8    Period  Weeks    Status  On-going      OT LONG TERM GOAL #3   Title  Pt will decrease fascial restrictions in LUE from moderate to minimal to improve mobility required for functional reaching tasks.     Time  8    Period  Weeks    Status  On-going      OT LONG TERM GOAL #4   Title  Pt will improve LUE A/ROM to [facility] to improve ability to reach overhead and behind back during bathing tasks.     Time  8    Period  Weeks    Status  On-going      OT LONG TERM GOAL #5   Title  Pt will improve LUE strength to 4+/5 to improve ability to use LUE as non-dominant during work tasks.     Time  8    Period  Weeks    Status  On-going            Plan - 08/22/17 1024    Clinical Impression Statement  A: Pt reports HEP is going well, has experienced some soreness over the weekend. Added AA/ROM in supine and pulleys this session, pt able to complete with occasional verbal cuing for form. Mod fatigue at end of session. Pt able to tolerate P/ROM [facility] today.     Plan  P: continue with AA/ROM and add in standing, add to HEP if pt completing with good form  OT Home Exercise Plan  10/16: table sildes; 10/23: scapular A/ROM    Consulted and Agree with Plan of Care  Patient       Patient will benefit from skilled therapeutic intervention in order to improve the following deficits and impairments:  Decreased activity tolerance, Decreased strength, Impaired flexibility, Decreased range of motion, Pain, Impaired UE functional use, Increased fascial restricitons  Visit Diagnosis: Stiffness of left shoulder, not elsewhere classified  Other symptoms and signs involving the musculoskeletal system  Acute pain of left  shoulder    Problem List Patient Active Problem List   Diagnosis Date Noted  . Elevated transaminase level 09/21/2015  . Vitamin D deficiency 09/21/2015  . History of gastric bypass 09/21/2015  . Anemia, iron deficiency 09/21/2015  . Hypothyroidism 05/20/2014  . Rheumatoid arthritis ([place]) 07/11/2013  . Constipation 01/19/2011  . Abdominal discomfort in left lower quadrant 01/19/2011  . GERD 01/01/2010  . HIATAL HERNIA 01/01/2010  . ANAL FISTULA 01/01/2010  . ABDOMINAL PAIN, UNSPECIFIED SITE 01/01/2010   Guadelupe Sabin, OTR/L  [phone] 08/22/2017, 10:36 AM  [place] [address] [place], [place], [address] Phone: [phone]   Fax:  [phone]  Name: GIGI ONSTAD MRN: [id number] Date of Birth: 07/09/1961

## 2017-08-23 DIAGNOSIS — L723 Sebaceous cyst: Secondary | ICD-10-CM | POA: Diagnosis not present

## 2017-08-23 DIAGNOSIS — L709 Acne, unspecified: Secondary | ICD-10-CM | POA: Diagnosis not present

## 2017-08-24 ENCOUNTER — Encounter (HOSPITAL_COMMUNITY): Payer: Self-pay | Admitting: Occupational Therapy

## 2017-08-24 ENCOUNTER — Ambulatory Visit (HOSPITAL_COMMUNITY): Payer: 59 | Admitting: Occupational Therapy

## 2017-08-24 DIAGNOSIS — M25512 Pain in left shoulder: Secondary | ICD-10-CM

## 2017-08-24 DIAGNOSIS — M25612 Stiffness of left shoulder, not elsewhere classified: Secondary | ICD-10-CM | POA: Diagnosis not present

## 2017-08-24 DIAGNOSIS — R29898 Other symptoms and signs involving the musculoskeletal system: Secondary | ICD-10-CM

## 2017-08-24 NOTE — Therapy (Signed)
[place], [place], [address] Phone: [phone]   Fax:  [phone]  Occupational Therapy Treatment  Patient Details  Name: [name] MRN: [id number] Date of Birth: [birth year] Referring Provider: Dr. Elsie Saas   Encounter Date: 08/24/2017  OT End of Session - 08/24/17 1120    Visit Number  8    Number of Visits  16    Date for OT Re-Evaluation  09/30/17 Mini-reassessment 08/30/17   Authorization Type  [employer]    Authorization Time Period  60 visit limit combined, no visits used; $30 copay    Authorization - Visit Number  8    Authorization - Number of Visits  60    OT Start Time  [phone]    OT Stop Time  1032    OT Time Calculation (min)  40 min    Activity Tolerance  Patient tolerated treatment well    Behavior During Therapy  [facility] for tasks assessed/performed       Past Medical History:  Diagnosis Date  . Abdominal pain of unknown etiology   . Anal fistula    hx of   . Arthritis   . Constipation    severe  . Depression   . GERD (gastroesophageal reflux disease)   . Hiatal hernia   . Hyperthyroidism   . Prediabetes   . S/P colonoscopy April 2011   Dr. Olevia Perches: mild diverticulosis, hyperplastic polyps, repeat in 10 years  . S/P endoscopy April 2011   Dr. Olevia Perches: no hiatal hernia, generous opening to antrum., patent gastrojejunostomy    Past Surgical History:  Procedure Laterality Date  . anal fistulostomy     w/marsupialization an excision of anal tags  . BREAST LUMPECTOMY     benign  . COLONOSCOPY    . ESOPHAGOGASTRODUODENOSCOPY    . GASTRIC BYPASS     mini gastric bypass in [place] reportedly    There were no vitals filed for this visit.  Subjective Assessment - 08/24/17 0954    Subjective   S: It doesn't feel too tight this morning.     Currently in Pain?  No/denies         [facility] OT Assessment - 08/24/17 0953      Assessment   Diagnosis  s/p Left shoulder arthroscopy,  debridement, SAD, DCR      Precautions   Precautions  Shoulder    Type of Shoulder Precautions  P/ROM and progress as tolerated               OT Treatments/Exercises (OP) - 08/24/17 0954      Exercises   Exercises  Shoulder      Shoulder Exercises: Supine   Protraction  PROM;5 reps;AAROM;10 reps    Horizontal ABduction  PROM;5 reps;AAROM;10 reps    External Rotation  PROM;5 reps;AAROM;10 reps    Internal Rotation  PROM;5 reps;AAROM;10 reps    Flexion  PROM;5 reps;AAROM;10 reps    ABduction  PROM;5 reps;AAROM;10 reps      Shoulder Exercises: Standing   Protraction  AAROM;10 reps    Horizontal ABduction  AAROM;10 reps    External Rotation  AAROM;10 reps    Internal Rotation  AAROM;10 reps    Flexion  AAROM;10 reps    ABduction  AAROM;10 reps      Manual Therapy   Manual Therapy  Myofascial release    Manual therapy comments  Completed separately from therapeutic exercise    Myofascial Release  Myofascial release to left upper arm, trapezius, and scapularis regions to decrease pain and fascial restrictions and increase joint range of motion.              OT Education - 08/24/17 1026    Education provided  Yes    Education Details  AA/ROM exercises    Person(s) Educated  Patient    Methods  Explanation;Demonstration;Handout    Comprehension  Verbalized understanding;Returned demonstration       OT Short Term Goals - 08/02/17 1029      OT SHORT TERM GOAL #1   Title  Pt will be educated and independent in HEP to improve LUE mobility.     Time  4    Period  Weeks    Status  On-going      OT SHORT TERM GOAL #2   Title  Pt will decrease pain in LUE to 5/10 during ADL completion.     Time  4    Period  Weeks    Status  On-going      OT SHORT TERM GOAL #3   Title  Pt will improve LUE P/ROM to [facility] to increase ability to perform dressing tasks without compensatory strategies.     Time  4    Period  Weeks    Status  On-going      OT SHORT TERM GOAL #4    Title  Pt will improve LUE strength to 3/5 to improve ability to reach for items at waist and shoulder level.     Time  4    Period  Weeks    Status  On-going        OT Long Term Goals - 08/02/17 1029      OT LONG TERM GOAL #1   Title  Pt will return to highest level of functioning and independence in B/IADL completion using LUE as non-dominant.     Time  8    Period  Weeks    Status  On-going      OT LONG TERM GOAL #2   Title  Pt will decrease pain in LUE to 3/10 or less to improve use of LUE as non-dominant during B/ADL completion.    Time  8    Period  Weeks    Status  On-going      OT LONG TERM GOAL #3   Title  Pt will decrease fascial restrictions in LUE from moderate to minimal to improve mobility required for functional reaching tasks.     Time  8    Period  Weeks    Status  On-going      OT LONG TERM GOAL #4   Title  Pt will improve LUE A/ROM to [facility] to improve ability to reach overhead and behind back during bathing tasks.     Time  8    Period  Weeks    Status  On-going      OT LONG TERM GOAL #5   Title  Pt will improve LUE strength to 4+/5 to improve ability to use LUE as non-dominant during work tasks.     Time  8    Period  Weeks    Status  On-going            Plan - 08/24/17 1120    Clinical Impression Statement  A: Continued with AA/ROM supine and added in standing, pt requiring verbal cuing to dress shoulder for standing exercises. Updated HEP for AA/ROM as pt completing  with good form after initial cuing.     Plan  P: Mini-reassessment, follow up on HEP, continued with AA/ROM resuming pulleys and wall wash       Patient will benefit from skilled therapeutic intervention in order to improve the following deficits and impairments:  Decreased activity tolerance, Decreased strength, Impaired flexibility, Decreased range of motion, Pain, Impaired UE functional use, Increased fascial restricitons  Visit Diagnosis: Stiffness of left shoulder, not  elsewhere classified  Other symptoms and signs involving the musculoskeletal system  Acute pain of left shoulder    Problem List Patient Active Problem List   Diagnosis Date Noted  . Elevated transaminase level 09/21/2015  . Vitamin D deficiency 09/21/2015  . History of gastric bypass 09/21/2015  . Anemia, iron deficiency 09/21/2015  . Hypothyroidism 05/20/2014  . Rheumatoid arthritis ([place]) 07/11/2013  . Constipation 01/19/2011  . Abdominal discomfort in left lower quadrant 01/19/2011  . GERD 01/01/2010  . HIATAL HERNIA 01/01/2010  . ANAL FISTULA 01/01/2010  . ABDOMINAL PAIN, UNSPECIFIED SITE 01/01/2010   Guadelupe Sabin, OTR/L  [phone] 08/24/2017, 11:22 AM  [place] [address] [place], [place], [address] Phone: [phone]   Fax:  [phone]  Name: AGUEDA HOUPT MRN: [id number] Date of Birth: 1961/01/09

## 2017-08-24 NOTE — Patient Instructions (Signed)
Perform each exercise ____10____ reps. 2-3x days.   Protraction - STANDING  Start by holding a wand or cane at chest height.  Next, slowly push the wand outwards in front of your body so that your elbows become fully straightened. Then, return to the original position.     Shoulder FLEXION - STANDING - PALMS UP  In the standing position, hold a wand/cane with both arms, palms up on both sides. Raise up the wand/cane allowing your unaffected arm to perform most of the effort. Your affected arm should be partially relaxed.      Internal/External ROTATION - STANDING  In the standing position, hold a wand/cane with both hands keeping your elbows bent. Move your arms and wand/cane to one side.  Your affected arm should be partially relaxed while your unaffected arm performs most of the effort.       Shoulder ABDUCTION - STANDING  While holding a wand/cane palm face up on the injured side and palm face down on the uninjured side, slowly raise up your injured arm to the side.                     Horizontal Abduction/Adduction      Straight arms holding cane at shoulder height, bring cane to right, center, left. Repeat starting to left.   Copyright  VHI. All rights reserved.       

## 2017-08-29 ENCOUNTER — Ambulatory Visit (HOSPITAL_COMMUNITY): Payer: 59

## 2017-08-29 DIAGNOSIS — M25612 Stiffness of left shoulder, not elsewhere classified: Secondary | ICD-10-CM | POA: Diagnosis not present

## 2017-08-29 DIAGNOSIS — M25512 Pain in left shoulder: Secondary | ICD-10-CM

## 2017-08-29 DIAGNOSIS — R29898 Other symptoms and signs involving the musculoskeletal system: Secondary | ICD-10-CM

## 2017-08-29 NOTE — Therapy (Signed)
[place], [place], [address] Phone: [phone]   Fax:  [phone]  Occupational Therapy Treatment  Patient Details  Name: [name] MRN: [id number] Date of Birth: 07/22/1961 Referring Provider: Dr. Elsie Saas   Encounter Date: 08/29/2017  OT End of Session - 08/29/17 1138    Visit Number  9    Number of Visits  16    Date for OT Re-Evaluation  09/30/17    Authorization Type  UHC    Authorization Time Period  60 visit limit combined, no visits used; $30 copay    Authorization - Visit Number  9    Authorization - Number of Visits  38    OT Start Time  0950    OT Stop Time  1030    OT Time Calculation (min)  40 min    Activity Tolerance  Patient tolerated treatment well    Behavior During Therapy  [facility] for tasks assessed/performed       Past Medical History:  Diagnosis Date  . Abdominal pain of unknown etiology   . Anal fistula    hx of   . Arthritis   . Constipation    severe  . Depression   . GERD (gastroesophageal reflux disease)   . Hiatal hernia   . Hyperthyroidism   . Prediabetes   . S/P colonoscopy April 2011   Dr. Olevia Perches: mild diverticulosis, hyperplastic polyps, repeat in 10 years  . S/P endoscopy April 2011   Dr. Olevia Perches: no hiatal hernia, generous opening to antrum., patent gastrojejunostomy    Past Surgical History:  Procedure Laterality Date  . anal fistulostomy     w/marsupialization an excision of anal tags  . BREAST LUMPECTOMY     benign  . COLONOSCOPY    . ESOPHAGOGASTRODUODENOSCOPY    . GASTRIC BYPASS     mini gastric bypass in [place] reportedly    There were no vitals filed for this visit.  Subjective Assessment - 08/29/17 1116    Subjective   S: I'm afraid to re-injury it.    Currently in Pain?  Yes    Pain Score  2     Pain Location  Shoulder    Pain Orientation  Left    Pain Descriptors / Indicators  Sore    Pain Type  Acute pain    Pain Radiating  Towards  n/a    Pain Onset  1 to 4 weeks ago    Pain Frequency  Constant    Aggravating Factors   movement and use    Pain Relieving Factors  rest, ice, and pain medication    Effect of Pain on Daily Activities  mod effect    Multiple Pain Sites  No         [facility] OT Assessment - 08/29/17 0953      Assessment   Diagnosis  s/p Left shoulder arthroscopy, debridement, SAD, DCR    Onset Date  07/28/17      Precautions   Precautions  Shoulder    Type of Shoulder Precautions  P/ROM and progress as tolerated      Prior Function   Level of Independence  Independent      ROM / Strength   AROM / PROM / Strength  AROM;PROM;Strength      AROM   Overall AROM Comments  Assessed supine. IR/er adducted. A/ROM not assessed priot to today.    AROM Assessment Site  Shoulder  Right/Left Shoulder  Left    Left Shoulder Flexion  115 Degrees    Left Shoulder ABduction  85 Degrees    Left Shoulder Internal Rotation  90 Degrees    Left Shoulder External Rotation  50 Degrees      PROM   Overall PROM Comments  Assessed supine, er/IR adducted    PROM Assessment Site  Shoulder    Right/Left Shoulder  Left    Left Shoulder Flexion  150 Degrees previous: 75    Left Shoulder ABduction  144 Degrees previuos: 50    Left Shoulder Internal Rotation  90 Degrees previous: same    Left Shoulder External Rotation  75 Degrees previous: 30      Strength   Overall Strength Comments  Assessed seated. IR/er adducted. Strength not assessed prior to today.    Strength Assessment Site  Shoulder    Right/Left Shoulder  Left    Left Shoulder Flexion  3-/5    Left Shoulder ABduction  3-/5    Left Shoulder Internal Rotation  3/5    Left Shoulder External Rotation  3/5               OT Treatments/Exercises (OP) - 08/29/17 1014      Exercises   Exercises  Shoulder      Shoulder Exercises: Supine   Protraction  PROM;5 reps;AAROM;12 reps    Horizontal ABduction  PROM;5 reps;AAROM;12 reps    External  Rotation  PROM;5 reps;AAROM;12 reps    Internal Rotation  PROM;5 reps;AAROM;12 reps    Flexion  PROM;5 reps;AAROM;12 reps    ABduction  PROM;5 reps;AAROM;12 reps      Shoulder Exercises: Pulleys   Flexion  1 minute seated. Hold knot    ABduction  1 minute seated holding knot      Shoulder Exercises: ROM/Strengthening   Thumb Tacks  1'      Manual Therapy   Manual Therapy  Myofascial release    Manual therapy comments  Completed separately from therapeutic exercise    Myofascial Release  Myofascial release to left upper arm, trapezius, and scapularis regions to decrease pain and fascial restrictions and increase joint range of motion.                OT Short Term Goals - 08/29/17 1139      OT SHORT TERM GOAL #1   Title  Pt will be educated and independent in HEP to improve LUE mobility.     Time  4    Period  Weeks    Status  Achieved      OT SHORT TERM GOAL #2   Title  Pt will decrease pain in LUE to 5/10 during ADL completion.     Time  4    Period  Weeks    Status  Achieved      OT SHORT TERM GOAL #3   Title  Pt will improve LUE P/ROM to [facility] to increase ability to perform dressing tasks without compensatory strategies.     Time  4    Period  Weeks    Status  Achieved      OT SHORT TERM GOAL #4   Title  Pt will improve LUE strength to 3/5 to improve ability to reach for items at waist and shoulder level.     Time  4    Period  Weeks    Status  Partially Met        OT Long Term Goals -  08/02/17 1029      OT LONG TERM GOAL #1   Title  Pt will return to highest level of functioning and independence in B/IADL completion using LUE as non-dominant.     Time  8    Period  Weeks    Status  On-going      OT LONG TERM GOAL #2   Title  Pt will decrease pain in LUE to 3/10 or less to improve use of LUE as non-dominant during B/ADL completion.    Time  8    Period  Weeks    Status  On-going      OT LONG TERM GOAL #3   Title  Pt will decrease fascial  restrictions in LUE from moderate to minimal to improve mobility required for functional reaching tasks.     Time  8    Period  Weeks    Status  On-going      OT LONG TERM GOAL #4   Title  Pt will improve LUE A/ROM to [facility] to improve ability to reach overhead and behind back during bathing tasks.     Time  8    Period  Weeks    Status  On-going      OT LONG TERM GOAL #5   Title  Pt will improve LUE strength to 4+/5 to improve ability to use LUE as non-dominant during work tasks.     Time  8    Period  Weeks    Status  On-going            Plan - September 23, 2017 1140    Clinical Impression Statement  A: Mini reassessment completed this date. patient has met 3/4 short term goals. Pt reports that she is using her arm more during daily tasks. She is cautious about lifting and re-injuring it. She has deficits with A/ROM, pain with use, and strength.     Plan  P: Continue with AA/ROM standing. Attempt A/ROM when able to tolerate.        Patient will benefit from skilled therapeutic intervention in order to improve the following deficits and impairments:     Visit Diagnosis: Stiffness of left shoulder, not elsewhere classified  Other symptoms and signs involving the musculoskeletal system  Acute pain of left shoulder  G-Codes - Sep 23, 2017 1141    Functional Assessment Tool Used (Outpatient only)  Clinical judgement    Functional Limitation  Carrying, moving and handling objects    Carrying, Moving and Handling Objects Current Status (U3149)  At least 60 percent but less than 80 percent impaired, limited or restricted    Carrying, Moving and Handling Objects Goal Status (F0263)  At least 40 percent but less than 60 percent impaired, limited or restricted       Problem List Patient Active Problem List   Diagnosis Date Noted  . Elevated transaminase level 09/21/2015  . Vitamin D deficiency 09/21/2015  . History of gastric bypass 09/21/2015  . Anemia, iron deficiency 09/21/2015  .  Hypothyroidism 05/20/2014  . Rheumatoid arthritis ([place]) 07/11/2013  . Constipation 01/19/2011  . Abdominal discomfort in left lower quadrant 01/19/2011  . GERD 01/01/2010  . HIATAL HERNIA 01/01/2010  . ANAL FISTULA 01/01/2010  . ABDOMINAL PAIN, UNSPECIFIED SITE 01/01/2010    Thi Klich, Clarene Duke 09-23-17, 11:43 AM  [place] [address] [place], [place], [address] Phone: [phone]   Fax:  [phone]  Name: [name] MRN: [id number] Date of Birth: [birth year]

## 2017-08-31 ENCOUNTER — Other Ambulatory Visit: Payer: Self-pay

## 2017-08-31 ENCOUNTER — Ambulatory Visit (HOSPITAL_COMMUNITY): Payer: 59 | Admitting: Occupational Therapy

## 2017-08-31 DIAGNOSIS — M542 Cervicalgia: Secondary | ICD-10-CM | POA: Diagnosis not present

## 2017-08-31 DIAGNOSIS — M25512 Pain in left shoulder: Secondary | ICD-10-CM

## 2017-08-31 DIAGNOSIS — M25612 Stiffness of left shoulder, not elsewhere classified: Secondary | ICD-10-CM

## 2017-08-31 DIAGNOSIS — R29898 Other symptoms and signs involving the musculoskeletal system: Secondary | ICD-10-CM

## 2017-08-31 NOTE — Therapy (Signed)
[place], [place], [address] Phone: [phone]   Fax:  [phone]  Occupational Therapy Treatment  Patient Details  Name: [name] MRN: [id number] Date of Birth: Mar 08, 1961 Referring Provider: Dr. Elsie Saas   Encounter Date: 08/31/2017  OT End of Session - 08/31/17 1422    Visit Number  10    Number of Visits  16    Date for OT Re-Evaluation  09/30/17    Authorization Type  UHC    Authorization Time Period  60 visit limit combined, no visits used; $30 copay    Authorization - Visit Number  10    Authorization - Number of Visits  33    OT Start Time  [id number] pt arrived late    OT Stop Time  1036    OT Time Calculation (min)  38 min    Activity Tolerance  Patient tolerated treatment well    Behavior During Therapy  [facility] for tasks assessed/performed       Past Medical History:  Diagnosis Date  . Abdominal pain of unknown etiology   . Anal fistula    hx of   . Arthritis   . Constipation    severe  . Depression   . GERD (gastroesophageal reflux disease)   . Hiatal hernia   . Hyperthyroidism   . Prediabetes   . S/P colonoscopy April 2011   Dr. Olevia Perches: mild diverticulosis, hyperplastic polyps, repeat in 10 years  . S/P endoscopy April 2011   Dr. Olevia Perches: no hiatal hernia, generous opening to antrum., patent gastrojejunostomy    Past Surgical History:  Procedure Laterality Date  . anal fistulostomy     w/marsupialization an excision of anal tags  . BREAST LUMPECTOMY     benign  . COLONOSCOPY    . ESOPHAGOGASTRODUODENOSCOPY    . GASTRIC BYPASS     mini gastric bypass in [place] reportedly    There were no vitals filed for this visit.  Subjective Assessment - 08/31/17 1002    Subjective   S: My hand goes numb sometimes.     Currently in Pain?  No/denies         [facility] OT Assessment - 08/31/17 1001      Assessment   Diagnosis  s/p Left shoulder arthroscopy, debridement, SAD, DCR       Precautions   Precautions  Shoulder    Type of Shoulder Precautions  P/ROM and progress as tolerated               OT Treatments/Exercises (OP) - 08/31/17 1013      Exercises   Exercises  Shoulder      Shoulder Exercises: Supine   Protraction  PROM;5 reps;AROM;10 reps    Horizontal ABduction  PROM;5 reps;AROM;10 reps    External Rotation  PROM;5 reps;AROM;10 reps    Internal Rotation  PROM;5 reps;AROM;10 reps    Flexion  PROM;5 reps;AROM;10 reps    ABduction  PROM;5 reps;AROM;10 reps      Shoulder Exercises: Standing   Protraction  AAROM;10 reps    Horizontal ABduction  AAROM;10 reps    External Rotation  AAROM;10 reps    Internal Rotation  AAROM;10 reps    Flexion  AAROM;10 reps    ABduction  AAROM;10 reps      Shoulder Exercises: Pulleys   Flexion  1 minute holding knot    ABduction  1 minute holding knot      Manual Therapy  Manual Therapy  Myofascial release    Manual therapy comments  Completed separately from therapeutic exercise    Myofascial Release  Myofascial release to left upper arm, trapezius, and scapularis regions to decrease pain and fascial restrictions and increase joint range of motion.                OT Short Term Goals - 08/29/17 1139      OT SHORT TERM GOAL #1   Title  Pt will be educated and independent in HEP to improve LUE mobility.     Time  4    Period  Weeks    Status  Achieved      OT SHORT TERM GOAL #2   Title  Pt will decrease pain in LUE to 5/10 during ADL completion.     Time  4    Period  Weeks    Status  Achieved      OT SHORT TERM GOAL #3   Title  Pt will improve LUE P/ROM to WFL to increase ability to perform dressing tasks without compensatory strategies.     Time  4    Period  Weeks    Status  Achieved      OT SHORT TERM GOAL #4   Title  Pt will improve LUE strength to 3/5 to improve ability to reach for items at waist and shoulder level.     Time  4    Period  Weeks    Status  Partially Met         OT Long Term Goals - 08/02/17 1029      OT LONG TERM GOAL #1   Title  Pt will return to highest level of functioning and independence in B/IADL completion using LUE as non-dominant.     Time  8    Period  Weeks    Status  On-going      OT LONG TERM GOAL #2   Title  Pt will decrease pain in LUE to 3/10 or less to improve use of LUE as non-dominant during B/ADL completion.    Time  8    Period  Weeks    Status  On-going      OT LONG TERM GOAL #3   Title  Pt will decrease fascial restrictions in LUE from moderate to minimal to improve mobility required for functional reaching tasks.     Time  8    Period  Weeks    Status  On-going      OT LONG TERM GOAL #4   Title  Pt will improve LUE A/ROM to WFL to improve ability to reach overhead and behind back during bathing tasks.     Time  8    Period  Weeks    Status  On-going      OT LONG TERM GOAL #5   Title  Pt will improve LUE strength to 4+/5 to improve ability to use LUE as non-dominant during work tasks.     Time  8    Period  Weeks    Status  On-going            Plan - 08/31/17 1027    Clinical Impression Statement  A: Pt reports HEP is going well, she is mostly completing on her back. Educated pt on adding AA/ROM in standing to HEP. Added A/ROM in supine today, initial cuing for form and technique, mod fatigue at end of task. Continued with AA/ROM in standing, cuing to   depress trapezius.     Plan  P: Continue with A/ROM in supine, add prot/ret/elev/dep. Follow up on MD appt    Consulted and Agree with Plan of Care  Patient       Patient will benefit from skilled therapeutic intervention in order to improve the following deficits and impairments:  Decreased activity tolerance, Decreased strength, Impaired flexibility, Decreased range of motion, Pain, Impaired UE functional use, Increased fascial restricitons  Visit Diagnosis: Stiffness of left shoulder, not elsewhere classified  Other symptoms and signs  involving the musculoskeletal system  Acute pain of left shoulder    Problem List Patient Active Problem List   Diagnosis Date Noted  . Elevated transaminase level 09/21/2015  . Vitamin D deficiency 09/21/2015  . History of gastric bypass 09/21/2015  . Anemia, iron deficiency 09/21/2015  . Hypothyroidism 05/20/2014  . Rheumatoid arthritis ([facility]) 07/11/2013  . Constipation 01/19/2011  . Abdominal discomfort in left lower quadrant 01/19/2011  . GERD 01/01/2010  . HIATAL HERNIA 01/01/2010  . ANAL FISTULA 01/01/2010  . ABDOMINAL PAIN, UNSPECIFIED SITE 01/01/2010    , OTR/L  [phone] 08/31/2017, 2:24 PM  [facility] [address] [place], [place], [address] Phone: [phone]   Fax:  [phone]  Name: [name] MRN: [id number] Date of Birth: 12/12/1960 

## 2017-09-05 ENCOUNTER — Ambulatory Visit (HOSPITAL_COMMUNITY): Payer: 59

## 2017-09-05 DIAGNOSIS — M25512 Pain in left shoulder: Secondary | ICD-10-CM

## 2017-09-05 DIAGNOSIS — R29898 Other symptoms and signs involving the musculoskeletal system: Secondary | ICD-10-CM

## 2017-09-05 DIAGNOSIS — M25612 Stiffness of left shoulder, not elsewhere classified: Secondary | ICD-10-CM | POA: Diagnosis not present

## 2017-09-05 NOTE — Therapy (Signed)
[place], [place], [address] Phone: [phone]   Fax:  [phone]  Occupational Therapy Treatment  Patient Details  Name: [name] MRN: [id number] Date of Birth: [birth year] Referring Provider: Dr. Elsie Saas   Encounter Date: 09/05/2017  OT End of Session - 09/05/17 1044    Visit Number  11    Number of Visits  16    Date for OT Re-Evaluation  09/30/17    Authorization Type  [employer]    Authorization Time Period  60 visit limit combined, no visits used; $30 copay    Authorization - Visit Number  11    Authorization - Number of Visits  15    OT Start Time  0953 Pt arrived late    OT Stop Time  1030    OT Time Calculation (min)  37 min    Activity Tolerance  Patient tolerated treatment well    Behavior During Therapy  [facility] for tasks assessed/performed       Past Medical History:  Diagnosis Date  . Abdominal pain of unknown etiology   . Anal fistula    hx of   . Arthritis   . Constipation    severe  . Depression   . GERD (gastroesophageal reflux disease)   . Hiatal hernia   . Hyperthyroidism   . Prediabetes   . S/P colonoscopy April 2011   Dr. Olevia Perches: mild diverticulosis, hyperplastic polyps, repeat in 10 years  . S/P endoscopy April 2011   Dr. Olevia Perches: no hiatal hernia, generous opening to antrum., patent gastrojejunostomy    Past Surgical History:  Procedure Laterality Date  . anal fistulostomy     w/marsupialization an excision of anal tags  . BREAST LUMPECTOMY     benign  . COLONOSCOPY    . ESOPHAGOGASTRODUODENOSCOPY    . GASTRIC BYPASS     mini gastric bypass in [place] reportedly    There were no vitals filed for this visit.  Subjective Assessment - 09/05/17 1024    Subjective   S: The doctor thinks I may have a pinch nerve in my neck and he put me on Prednisone to see if it'll help.     Currently in Pain?  Yes    Pain Score  2     Pain Location  Shoulder    Pain  Orientation  Left    Pain Descriptors / Indicators  Sore    Pain Type  Acute pain    Pain Radiating Towards  N/A    Pain Onset  In the past 7 days    Pain Frequency  Constant    Aggravating Factors   Movement and use    Pain Relieving Factors  rest, pain medication, and ice    Effect of Pain on Daily Activities  min-mod effect    Multiple Pain Sites  No         [facility] OT Assessment - 09/05/17 1016      Assessment   Diagnosis  s/p Left shoulder arthroscopy, debridement, SAD, DCR      Precautions   Precautions  Shoulder    Type of Shoulder Precautions  P/ROM and progress as tolerated               OT Treatments/Exercises (OP) - 09/05/17 1016      Exercises   Exercises  Shoulder      Shoulder Exercises: Supine   Protraction  PROM;5 reps;AROM;10 reps  Horizontal ABduction  PROM;5 reps;AROM;10 reps    External Rotation  PROM;5 reps;AROM;10 reps    Internal Rotation  PROM;5 reps;AROM;10 reps    Flexion  PROM;5 reps;AROM;10 reps    ABduction  PROM;5 reps;AROM;10 reps      Shoulder Exercises: Standing   Protraction  AROM;10 reps    Horizontal ABduction  AROM;10 reps    External Rotation  AROM;10 reps    Internal Rotation  AROM;10 reps    Flexion  AROM;10 reps    ABduction  AROM;10 reps      Manual Therapy   Manual Therapy  Myofascial release    Manual therapy comments  Completed separately from therapeutic exercise    Myofascial Release  Myofascial release to left upper arm, trapezius, and scapularis regions to decrease pain and fascial restrictions and increase joint range of motion.              OT Education - 09/05/17 1024    Education provided  Yes    Education Details  A/ROM shoulder exercises    Person(s) Educated  Patient    Methods  Explanation;Demonstration;Handout;Verbal cues    Comprehension  Returned demonstration;Verbalized understanding       OT Short Term Goals - 08/29/17 1139      OT SHORT TERM GOAL #1   Title  Pt will be  educated and independent in HEP to improve LUE mobility.     Time  4    Period  Weeks    Status  Achieved      OT SHORT TERM GOAL #2   Title  Pt will decrease pain in LUE to 5/10 during ADL completion.     Time  4    Period  Weeks    Status  Achieved      OT SHORT TERM GOAL #3   Title  Pt will improve LUE P/ROM to [facility] to increase ability to perform dressing tasks without compensatory strategies.     Time  4    Period  Weeks    Status  Achieved      OT SHORT TERM GOAL #4   Title  Pt will improve LUE strength to 3/5 to improve ability to reach for items at waist and shoulder level.     Time  4    Period  Weeks    Status  Partially Met        OT Long Term Goals - 08/02/17 1029      OT LONG TERM GOAL #1   Title  Pt will return to highest level of functioning and independence in B/IADL completion using LUE as non-dominant.     Time  8    Period  Weeks    Status  On-going      OT LONG TERM GOAL #2   Title  Pt will decrease pain in LUE to 3/10 or less to improve use of LUE as non-dominant during B/ADL completion.    Time  8    Period  Weeks    Status  On-going      OT LONG TERM GOAL #3   Title  Pt will decrease fascial restrictions in LUE from moderate to minimal to improve mobility required for functional reaching tasks.     Time  8    Period  Weeks    Status  On-going      OT LONG TERM GOAL #4   Title  Pt will improve LUE A/ROM to [facility] to improve ability  to reach overhead and behind back during bathing tasks.     Time  8    Period  Weeks    Status  On-going      OT LONG TERM GOAL #5   Title  Pt will improve LUE strength to 4+/5 to improve ability to use LUE as non-dominant during work tasks.     Time  8    Period  Weeks    Status  On-going            Plan - 09/05/17 1044    Clinical Impression Statement  A: HEP updated for A/ROM, pt was able to complete both supine and standing A/ROM shoulder exercises with mild discomfort. ROM is [facility] actively. VC for form  and technique as needed. MD thinks she may have a pinched nerve in her neck. With the prednisone she has been able to sleep at night without numbness in her arms.    Plan  P: Add wall wash.        Patient will benefit from skilled therapeutic intervention in order to improve the following deficits and impairments:  Decreased activity tolerance, Decreased strength, Impaired flexibility, Decreased range of motion, Pain, Impaired UE functional use, Increased fascial restricitons  Visit Diagnosis: Stiffness of left shoulder, not elsewhere classified  Other symptoms and signs involving the musculoskeletal system  Acute pain of left shoulder    Problem List Patient Active Problem List   Diagnosis Date Noted  . Elevated transaminase level 09/21/2015  . Vitamin D deficiency 09/21/2015  . History of gastric bypass 09/21/2015  . Anemia, iron deficiency 09/21/2015  . Hypothyroidism 05/20/2014  . Rheumatoid arthritis ([place]) 07/11/2013  . Constipation 01/19/2011  . Abdominal discomfort in left lower quadrant 01/19/2011  . GERD 01/01/2010  . HIATAL HERNIA 01/01/2010  . ANAL FISTULA 01/01/2010  . ABDOMINAL PAIN, UNSPECIFIED SITE 01/01/2010   [name], OTR/L,CBIS  [phone]  09/05/2017, 10:47 AM  [facility] [address] [place], [place], [address] Phone: [phone]   Fax:  [phone]  Name: [name] MRN: [id number] Date of Birth: 10/23/1960

## 2017-09-05 NOTE — Patient Instructions (Signed)

## 2017-09-06 ENCOUNTER — Encounter (HOSPITAL_COMMUNITY): Payer: 59 | Admitting: Occupational Therapy

## 2017-09-12 ENCOUNTER — Ambulatory Visit (HOSPITAL_COMMUNITY): Payer: 59

## 2017-09-12 DIAGNOSIS — M25612 Stiffness of left shoulder, not elsewhere classified: Secondary | ICD-10-CM | POA: Diagnosis not present

## 2017-09-12 DIAGNOSIS — R29898 Other symptoms and signs involving the musculoskeletal system: Secondary | ICD-10-CM

## 2017-09-12 DIAGNOSIS — M25512 Pain in left shoulder: Secondary | ICD-10-CM

## 2017-09-12 NOTE — Therapy (Signed)
[place], [place], [address] Phone: [phone]   Fax:  [phone]  Occupational Therapy Treatment  Patient Details  Name: [name] MRN: [id number] Date of Birth: 05/09/1961 Referring Provider: Dr. Elsie Saas   Encounter Date: 09/12/2017  OT End of Session - 09/12/17 1054    Visit Number  12    Number of Visits  16    Date for OT Re-Evaluation  09/30/17    Authorization Type  [employer]    Authorization Time Period  60 visit limit combined, no visits used; $30 copay    Authorization - Visit Number  12    Authorization - Number of Visits  60    OT Start Time  0950    OT Stop Time  1030    OT Time Calculation (min)  40 min    Activity Tolerance  Patient tolerated treatment well    Behavior During Therapy  [facility] for tasks assessed/performed       Past Medical History:  Diagnosis Date  . Abdominal pain of unknown etiology   . Anal fistula    hx of   . Arthritis   . Constipation    severe  . Depression   . GERD (gastroesophageal reflux disease)   . Hiatal hernia   . Hyperthyroidism   . Prediabetes   . S/P colonoscopy April 2011   Dr. Olevia Perches: mild diverticulosis, hyperplastic polyps, repeat in 10 years  . S/P endoscopy April 2011   Dr. Olevia Perches: no hiatal hernia, generous opening to antrum., patent gastrojejunostomy    Past Surgical History:  Procedure Laterality Date  . anal fistulostomy     w/marsupialization an excision of anal tags  . BREAST LUMPECTOMY     benign  . COLONOSCOPY    . ESOPHAGOGASTRODUODENOSCOPY    . GASTRIC BYPASS     mini gastric bypass in [place] reportedly    There were no vitals filed for this visit.                OT Treatments/Exercises (OP) - 09/12/17 0001      Exercises   Exercises  Shoulder      Shoulder Exercises: Supine   Protraction  PROM;5 reps;AROM;12 reps    Horizontal ABduction  PROM;5 reps;AROM;12 reps    External Rotation  PROM;5  reps;AROM;12 reps    Internal Rotation  PROM;5 reps;AROM;12 reps    Flexion  PROM;5 reps;AROM;12 reps    ABduction  PROM;5 reps;AROM;12 reps      Shoulder Exercises: Standing   Protraction  AROM;12 reps    Horizontal ABduction  AROM;12 reps    External Rotation  AROM;12 reps    Internal Rotation  AROM;12 reps    Flexion  AROM;12 reps    ABduction  AROM;12 reps      Shoulder Exercises: ROM/Strengthening   Proximal Shoulder Strengthening, Supine  10X no rest breaks    Proximal Shoulder Strengthening, Seated  10X no rest breaks      Manual Therapy   Manual Therapy  Myofascial release    Manual therapy comments  Completed separately from therapeutic exercise    Myofascial Release  Myofascial release to left upper arm, trapezius, and scapularis regions to decrease pain and fascial restrictions and increase joint range of motion.                OT Short Term Goals - 09/12/17 1056      OT SHORT TERM GOAL #  1   Title  Pt will be educated and independent in HEP to improve LUE mobility.     Time  4    Period  Weeks      OT SHORT TERM GOAL #2   Title  Pt will decrease pain in LUE to 5/10 during ADL completion.     Time  4    Period  Weeks      OT SHORT TERM GOAL #3   Title  Pt will improve LUE P/ROM to [facility] to increase ability to perform dressing tasks without compensatory strategies.     Time  4    Period  Weeks      OT SHORT TERM GOAL #4   Title  Pt will improve LUE strength to 3/5 to improve ability to reach for items at waist and shoulder level.     Time  4    Period  Weeks    Status  Partially Met        OT Long Term Goals - 08/02/17 1029      OT LONG TERM GOAL #1   Title  Pt will return to highest level of functioning and independence in B/IADL completion using LUE as non-dominant.     Time  8    Period  Weeks    Status  On-going      OT LONG TERM GOAL #2   Title  Pt will decrease pain in LUE to 3/10 or less to improve use of LUE as non-dominant during  B/ADL completion.    Time  8    Period  Weeks    Status  On-going      OT LONG TERM GOAL #3   Title  Pt will decrease fascial restrictions in LUE from moderate to minimal to improve mobility required for functional reaching tasks.     Time  8    Period  Weeks    Status  On-going      OT LONG TERM GOAL #4   Title  Pt will improve LUE A/ROM to [facility] to improve ability to reach overhead and behind back during bathing tasks.     Time  8    Period  Weeks    Status  On-going      OT LONG TERM GOAL #5   Title  Pt will improve LUE strength to 4+/5 to improve ability to use LUE as non-dominant during work tasks.     Time  8    Period  Weeks    Status  On-going            Plan - 09/12/17 1055    Clinical Impression Statement  A: Did not add wal wash due to time constraint. Was able to add proximal shoulder strengthening supine and standing. Pt required VC for form and technique.    Plan  P: Add wall wash.       Patient will benefit from skilled therapeutic intervention in order to improve the following deficits and impairments:  Decreased activity tolerance, Decreased strength, Impaired flexibility, Decreased range of motion, Pain, Impaired UE functional use, Increased fascial restricitons  Visit Diagnosis: Stiffness of left shoulder, not elsewhere classified  Other symptoms and signs involving the musculoskeletal system  Acute pain of left shoulder    Problem List Patient Active Problem List   Diagnosis Date Noted  . Elevated transaminase level 09/21/2015  . Vitamin D deficiency 09/21/2015  . History of gastric bypass 09/21/2015  . Anemia, iron deficiency  09/21/2015  . Hypothyroidism 05/20/2014  . Rheumatoid arthritis ([place]) 07/11/2013  . Constipation 01/19/2011  . Abdominal discomfort in left lower quadrant 01/19/2011  . GERD 01/01/2010  . HIATAL HERNIA 01/01/2010  . ANAL FISTULA 01/01/2010  . ABDOMINAL PAIN, UNSPECIFIED SITE 01/01/2010   [name],  OTR/L,CBIS  [phone]  09/12/2017, 10:58 AM  [place] [address] [place], [place], [address] Phone: [phone]   Fax:  [phone]  Name: [name] MRN: [id number] Date of Birth: [birth year]

## 2017-09-14 ENCOUNTER — Ambulatory Visit (HOSPITAL_COMMUNITY): Payer: 59

## 2017-09-14 ENCOUNTER — Encounter (HOSPITAL_COMMUNITY): Payer: Self-pay

## 2017-09-14 DIAGNOSIS — R29898 Other symptoms and signs involving the musculoskeletal system: Secondary | ICD-10-CM

## 2017-09-14 DIAGNOSIS — M25612 Stiffness of left shoulder, not elsewhere classified: Secondary | ICD-10-CM | POA: Diagnosis not present

## 2017-09-14 DIAGNOSIS — M25512 Pain in left shoulder: Secondary | ICD-10-CM

## 2017-09-14 NOTE — Therapy (Signed)
[place], [place], [address] Phone: [phone]   Fax:  [phone]  Occupational Therapy Treatment  Patient Details  Name: [name] MRN: [id number] Date of Birth: September 19, 1961 Referring Provider: Dr. Elsie Saas   Encounter Date: 09/14/2017  OT End of Session - 09/14/17 0948    Visit Number  13    Number of Visits  16    Date for OT Re-Evaluation  09/30/17    Authorization Type  UHC    Authorization Time Period  60 visit limit combined, no visits used; $30 copay    Authorization - Visit Number  13    Authorization - Number of Visits  60    OT Start Time  0908    OT Stop Time  0948    OT Time Calculation (min)  40 min    Activity Tolerance  Patient tolerated treatment well    Behavior During Therapy  [facility] for tasks assessed/performed       Past Medical History:  Diagnosis Date  . Abdominal pain of unknown etiology   . Anal fistula    hx of   . Arthritis   . Constipation    severe  . Depression   . GERD (gastroesophageal reflux disease)   . Hiatal hernia   . Hyperthyroidism   . Prediabetes   . S/P colonoscopy April 2011   Dr. Olevia Perches: mild diverticulosis, hyperplastic polyps, repeat in 10 years  . S/P endoscopy April 2011   Dr. Olevia Perches: no hiatal hernia, generous opening to antrum., patent gastrojejunostomy    Past Surgical History:  Procedure Laterality Date  . anal fistulostomy     w/marsupialization an excision of anal tags  . BREAST LUMPECTOMY     benign  . COLONOSCOPY    . ESOPHAGOGASTRODUODENOSCOPY    . GASTRIC BYPASS     mini gastric bypass in [place] reportedly    There were no vitals filed for this visit.  Subjective Assessment - 09/14/17 0933    Subjective   S: I'm just a little sore.     Currently in Pain?  Yes    Pain Score  2     Pain Location  Shoulder    Pain Orientation  Left    Pain Descriptors / Indicators  Sore    Pain Type  Acute pain    Pain Radiating  Towards  N/A    Pain Onset  In the past 7 days    Pain Frequency  Constant    Aggravating Factors   movement and use    Pain Relieving Factors  rest, pain medication, and ice    Effect of Pain on Daily Activities  min-mod effect    Multiple Pain Sites  No         [facility] OT Assessment - 09/14/17 0934      Assessment   Diagnosis  s/p Left shoulder arthroscopy, debridement, SAD, DCR      Precautions   Precautions  Shoulder    Type of Shoulder Precautions  P/ROM and progress as tolerated               OT Treatments/Exercises (OP) - 09/14/17 0934      Exercises   Exercises  Shoulder      Shoulder Exercises: Supine   Protraction  PROM;5 reps;AROM;12 reps    Horizontal ABduction  PROM;5 reps;AROM;12 reps    External Rotation  PROM;5 reps;AROM;12 reps    Internal Rotation  PROM;5 reps;AROM;12 reps    Flexion  PROM;5 reps;AROM;12 reps    ABduction  PROM;5 reps;AROM;12 reps      Shoulder Exercises: Standing   Protraction  AROM;12 reps    Horizontal ABduction  AROM;12 reps    External Rotation  AROM;12 reps    Internal Rotation  AROM;12 reps    Flexion  AROM;12 reps    ABduction  AROM;12 reps    Extension  Theraband;10 reps    Theraband Level (Shoulder Extension)  Level 2 (Red)    Row  Theraband;10 reps    Theraband Level (Shoulder Row)  Level 2 (Red)    Retraction  Theraband;10 reps    Theraband Level (Shoulder Retraction)  Level 2 (Red)      Shoulder Exercises: ROM/Strengthening   Wall Wash  1'    Proximal Shoulder Strengthening, Supine  10X no rest breaks    Proximal Shoulder Strengthening, Seated  10X no rest breaks      Manual Therapy   Manual Therapy  Myofascial release    Manual therapy comments  Completed separately from therapeutic exercise    Myofascial Release  Myofascial release to left upper arm, trapezius, and scapularis regions to decrease pain and fascial restrictions and increase joint range of motion.                OT Short Term  Goals - 09/12/17 1056      OT SHORT TERM GOAL #1   Title  Pt will be educated and independent in HEP to improve LUE mobility.     Time  4    Period  Weeks      OT SHORT TERM GOAL #2   Title  Pt will decrease pain in LUE to 5/10 during ADL completion.     Time  4    Period  Weeks      OT SHORT TERM GOAL #3   Title  Pt will improve LUE P/ROM to [facility] to increase ability to perform dressing tasks without compensatory strategies.     Time  4    Period  Weeks      OT SHORT TERM GOAL #4   Title  Pt will improve LUE strength to 3/5 to improve ability to reach for items at waist and shoulder level.     Time  4    Period  Weeks    Status  Partially Met        OT Long Term Goals - 08/02/17 1029      OT LONG TERM GOAL #1   Title  Pt will return to highest level of functioning and independence in B/IADL completion using LUE as non-dominant.     Time  8    Period  Weeks    Status  On-going      OT LONG TERM GOAL #2   Title  Pt will decrease pain in LUE to 3/10 or less to improve use of LUE as non-dominant during B/ADL completion.    Time  8    Period  Weeks    Status  On-going      OT LONG TERM GOAL #3   Title  Pt will decrease fascial restrictions in LUE from moderate to minimal to improve mobility required for functional reaching tasks.     Time  8    Period  Weeks    Status  On-going      OT LONG TERM GOAL #4   Title  Pt will improve LUE  A/ROM to [facility] to improve ability to reach overhead and behind back during bathing tasks.     Time  8    Period  Weeks    Status  On-going      OT LONG TERM GOAL #5   Title  Pt will improve LUE strength to 4+/5 to improve ability to use LUE as non-dominant during work tasks.     Time  8    Period  Weeks    Status  On-going            Plan - 09/14/17 4174    Clinical Impression Statement  A: Completed wall wash and added scapular strengthening with red band this session. VC for form and technique.    Plan  P: add scapular theraband  to HEP. Increased A/ROM repetitions to 15.    Consulted and Agree with Plan of Care  Patient       Patient will benefit from skilled therapeutic intervention in order to improve the following deficits and impairments:  Decreased activity tolerance, Decreased strength, Impaired flexibility, Decreased range of motion, Pain, Impaired UE functional use, Increased fascial restricitons  Visit Diagnosis: Stiffness of left shoulder, not elsewhere classified  Other symptoms and signs involving the musculoskeletal system  Acute pain of left shoulder    Problem List Patient Active Problem List   Diagnosis Date Noted  . Elevated transaminase level 09/21/2015  . Vitamin D deficiency 09/21/2015  . History of gastric bypass 09/21/2015  . Anemia, iron deficiency 09/21/2015  . Hypothyroidism 05/20/2014  . Rheumatoid arthritis ([place]) 07/11/2013  . Constipation 01/19/2011  . Abdominal discomfort in left lower quadrant 01/19/2011  . GERD 01/01/2010  . HIATAL HERNIA 01/01/2010  . ANAL FISTULA 01/01/2010  . ABDOMINAL PAIN, UNSPECIFIED SITE 01/01/2010   [name], OTR/L,CBIS  [phone]  09/14/2017, 12:36 PM  [facility] [address] [place], [place], [address] Phone: [phone]   Fax:  [phone]  Name: MYASIA SINATRA MRN: [id number] Date of Birth: [birth year]

## 2017-09-16 ENCOUNTER — Other Ambulatory Visit: Payer: Self-pay | Admitting: Family Medicine

## 2017-09-18 DIAGNOSIS — M79672 Pain in left foot: Secondary | ICD-10-CM | POA: Diagnosis not present

## 2017-09-18 DIAGNOSIS — M722 Plantar fascial fibromatosis: Secondary | ICD-10-CM | POA: Diagnosis not present

## 2017-09-18 DIAGNOSIS — M79671 Pain in right foot: Secondary | ICD-10-CM | POA: Diagnosis not present

## 2017-09-19 ENCOUNTER — Ambulatory Visit (HOSPITAL_COMMUNITY): Payer: 59 | Attending: Orthopedic Surgery

## 2017-09-19 ENCOUNTER — Encounter (HOSPITAL_COMMUNITY): Payer: Self-pay

## 2017-09-19 DIAGNOSIS — M25512 Pain in left shoulder: Secondary | ICD-10-CM | POA: Diagnosis not present

## 2017-09-19 DIAGNOSIS — R29898 Other symptoms and signs involving the musculoskeletal system: Secondary | ICD-10-CM | POA: Insufficient documentation

## 2017-09-19 DIAGNOSIS — M25612 Stiffness of left shoulder, not elsewhere classified: Secondary | ICD-10-CM | POA: Insufficient documentation

## 2017-09-19 NOTE — Therapy (Signed)
[place], [place], [address] Phone: [phone]   Fax:  [phone]  Occupational Therapy Treatment  Patient Details  Name: [name] MRN: [id number] Date of Birth: 1961-01-15 Referring Provider: Dr. Elsie Saas   Encounter Date: 09/19/2017  OT End of Session - 09/19/17 1201    Visit Number  14    Number of Visits  16    Date for OT Re-Evaluation  09/30/17    Authorization Type  [employer]    Authorization Time Period  60 visit limit combined, no visits used; $30 copay    Authorization - Visit Number  14    Authorization - Number of Visits  60    OT Start Time  0955    OT Stop Time  1033    OT Time Calculation (min)  38 min    Activity Tolerance  Patient tolerated treatment well    Behavior During Therapy  [facility] for tasks assessed/performed       Past Medical History:  Diagnosis Date  . Abdominal pain of unknown etiology   . Anal fistula    hx of   . Arthritis   . Constipation    severe  . Depression   . GERD (gastroesophageal reflux disease)   . Hiatal hernia   . Hyperthyroidism   . Prediabetes   . S/P colonoscopy April 2011   Dr. Olevia Perches: mild diverticulosis, hyperplastic polyps, repeat in 10 years  . S/P endoscopy April 2011   Dr. Olevia Perches: no hiatal hernia, generous opening to antrum., patent gastrojejunostomy    Past Surgical History:  Procedure Laterality Date  . anal fistulostomy     w/marsupialization an excision of anal tags  . BREAST LUMPECTOMY     benign  . COLONOSCOPY    . ESOPHAGOGASTRODUODENOSCOPY    . GASTRIC BYPASS     mini gastric bypass in [place] reportedly    There were no vitals filed for this visit.  Subjective Assessment - 09/19/17 1156    Subjective   S: I'm just a little sore.     Currently in Pain?  No/denies         [facility] OT Assessment - 09/19/17 1156      Assessment   Diagnosis  s/p Left shoulder arthroscopy, debridement, SAD, DCR      Precautions   Precautions  Shoulder    Type of Shoulder Precautions  P/ROM and progress as tolerated               OT Treatments/Exercises (OP) - 09/19/17 1159      Exercises   Exercises  Shoulder      Shoulder Exercises: Supine   Protraction  PROM;5 reps;AROM;15 reps    Horizontal ABduction  PROM;5 reps;AROM;15 reps    External Rotation  PROM;5 reps;AROM;12 reps    Internal Rotation  PROM;5 reps;AROM;15 reps    Flexion  PROM;5 reps;AROM;15 reps    ABduction  PROM;5 reps;AROM;15 reps      Shoulder Exercises: Standing   Protraction  AROM;15 reps    Horizontal ABduction  AROM;15 reps    External Rotation  AROM;15 reps    Internal Rotation  AROM;15 reps    Flexion  AROM;15 reps    ABduction  AROM;15 reps      Shoulder Exercises: ROM/Strengthening   Proximal Shoulder Strengthening, Supine  15X no rest breaks    Proximal Shoulder Strengthening, Seated  15X no rest breaks  Manual Therapy   Manual Therapy  Myofascial release    Manual therapy comments  Completed separately from therapeutic exercise    Myofascial Release  Myofascial release to left upper arm, trapezius, and scapularis regions to decrease pain and fascial restrictions and increase joint range of motion.              OT Education - 09/19/17 1206    Education provided  Yes    Education Details  red theraband for scapular strengthening exercises.    Person(s) Educated  Patient    Methods  Explanation;Handout    Comprehension  Verbalized understanding       OT Short Term Goals - 09/12/17 1056      OT SHORT TERM GOAL #1   Title  Pt will be educated and independent in HEP to improve LUE mobility.     Time  4    Period  Weeks      OT SHORT TERM GOAL #2   Title  Pt will decrease pain in LUE to 5/10 during ADL completion.     Time  4    Period  Weeks      OT SHORT TERM GOAL #3   Title  Pt will improve LUE P/ROM to [facility] to increase ability to perform dressing tasks without compensatory strategies.     Time   4    Period  Weeks      OT SHORT TERM GOAL #4   Title  Pt will improve LUE strength to 3/5 to improve ability to reach for items at waist and shoulder level.     Time  4    Period  Weeks    Status  Partially Met        OT Long Term Goals - 08/02/17 1029      OT LONG TERM GOAL #1   Title  Pt will return to highest level of functioning and independence in B/IADL completion using LUE as non-dominant.     Time  8    Period  Weeks    Status  On-going      OT LONG TERM GOAL #2   Title  Pt will decrease pain in LUE to 3/10 or less to improve use of LUE as non-dominant during B/ADL completion.    Time  8    Period  Weeks    Status  On-going      OT LONG TERM GOAL #3   Title  Pt will decrease fascial restrictions in LUE from moderate to minimal to improve mobility required for functional reaching tasks.     Time  8    Period  Weeks    Status  On-going      OT LONG TERM GOAL #4   Title  Pt will improve LUE A/ROM to [facility] to improve ability to reach overhead and behind back during bathing tasks.     Time  8    Period  Weeks    Status  On-going      OT LONG TERM GOAL #5   Title  Pt will improve LUE strength to 4+/5 to improve ability to use LUE as non-dominant during work tasks.     Time  8    Period  Weeks    Status  On-going            Plan - 09/19/17 1217    Clinical Impression Statement  A: Increased A/ROM repetitions to 15 this session. patient was able to complete  with min difficulty. Added scapular strengthening to theraband. VC for form and technique.    Plan  P: Add UBE bike.       Patient will benefit from skilled therapeutic intervention in order to improve the following deficits and impairments:  Decreased activity tolerance, Decreased strength, Impaired flexibility, Decreased range of motion, Pain, Impaired UE functional use, Increased fascial restrictions  Visit Diagnosis: Stiffness of left shoulder, not elsewhere classified  Other symptoms and signs  involving the musculoskeletal system  Acute pain of left shoulder    Problem List Patient Active Problem List   Diagnosis Date Noted  . Elevated transaminase level 09/21/2015  . Vitamin D deficiency 09/21/2015  . History of gastric bypass 09/21/2015  . Anemia, iron deficiency 09/21/2015  . Hypothyroidism 05/20/2014  . Rheumatoid arthritis ([place]) 07/11/2013  . Constipation 01/19/2011  . Abdominal discomfort in left lower quadrant 01/19/2011  . GERD 01/01/2010  . HIATAL HERNIA 01/01/2010  . ANAL FISTULA 01/01/2010  . ABDOMINAL PAIN, UNSPECIFIED SITE 01/01/2010   [name], OTR/L,CBIS  [phone]  09/19/2017, 12:36 PM  [facility] [address] [place], [place], [address] Phone: [phone]   Fax:  [phone]  Name: [name] MRN: [id number] Date of Birth: 1960/11/13

## 2017-09-19 NOTE — Patient Instructions (Signed)

## 2017-09-21 ENCOUNTER — Encounter (HOSPITAL_COMMUNITY): Payer: Self-pay | Admitting: Occupational Therapy

## 2017-09-21 ENCOUNTER — Ambulatory Visit (HOSPITAL_COMMUNITY): Payer: 59 | Admitting: Occupational Therapy

## 2017-09-21 ENCOUNTER — Other Ambulatory Visit: Payer: Self-pay

## 2017-09-21 DIAGNOSIS — R29898 Other symptoms and signs involving the musculoskeletal system: Secondary | ICD-10-CM

## 2017-09-21 DIAGNOSIS — M25512 Pain in left shoulder: Secondary | ICD-10-CM

## 2017-09-21 DIAGNOSIS — M25612 Stiffness of left shoulder, not elsewhere classified: Secondary | ICD-10-CM

## 2017-09-21 NOTE — Therapy (Signed)
[place], [place], [address] Phone: [phone]   Fax:  [phone]  Occupational Therapy Treatment  Patient Details  Name: [name] MRN: [id number] Date of Birth: [birth year] Referring Provider: Dr. Elsie Saas   Encounter Date: 09/21/2017  OT End of Session - 09/21/17 1126    Visit Number  15    Number of Visits  16    Date for OT Re-Evaluation  09/30/17    Authorization Type  UHC    Authorization Time Period  60 visit limit combined, no visits used; $30 copay    Authorization - Visit Number  15    Authorization - Number of Visits  60    OT Start Time  [phone]    OT Stop Time  1032    OT Time Calculation (min)  40 min    Activity Tolerance  Patient tolerated treatment well    Behavior During Therapy  [facility] for tasks assessed/performed       Past Medical History:  Diagnosis Date  . Abdominal pain of unknown etiology   . Anal fistula    hx of   . Arthritis   . Constipation    severe  . Depression   . GERD (gastroesophageal reflux disease)   . Hiatal hernia   . Hyperthyroidism   . Prediabetes   . S/P colonoscopy April 2011   Dr. Olevia Perches: mild diverticulosis, hyperplastic polyps, repeat in 10 years  . S/P endoscopy April 2011   Dr. Olevia Perches: no hiatal hernia, generous opening to antrum., patent gastrojejunostomy    Past Surgical History:  Procedure Laterality Date  . anal fistulostomy     w/marsupialization an excision of anal tags  . BREAST LUMPECTOMY     benign  . COLONOSCOPY    . ESOPHAGOGASTRODUODENOSCOPY    . GASTRIC BYPASS     mini gastric bypass in [place] reportedly    There were no vitals filed for this visit.  Subjective Assessment - 09/21/17 0954    Subjective   S: It's definitately improving.     Currently in Pain?  No/denies         [facility] OT Assessment - 09/21/17 0954      Assessment   Diagnosis  s/p Left shoulder arthroscopy, debridement, SAD, DCR      Precautions   Precautions  Shoulder    Type of Shoulder Precautions  P/ROM and progress as tolerated               OT Treatments/Exercises (OP) - 09/21/17 0955      Exercises   Exercises  Shoulder      Shoulder Exercises: Supine   Protraction  PROM;5 reps;AROM;15 reps    Horizontal ABduction  PROM;5 reps;AROM;15 reps    External Rotation  PROM;5 reps;AROM;15 reps    Internal Rotation  PROM;5 reps;AROM;15 reps    Flexion  PROM;5 reps;AROM;15 reps    ABduction  PROM;5 reps;AROM;15 reps      Shoulder Exercises: Standing   Protraction  AROM;15 reps    Horizontal ABduction  AROM;15 reps    External Rotation  AROM;15 reps    Internal Rotation  AROM;15 reps    Flexion  AROM;15 reps    ABduction  AROM;15 reps    Extension  Theraband;15 reps    Theraband Level (Shoulder Extension)  Level 2 (Red)    Row  Theraband;15 reps    Theraband Level (Shoulder Row)  Level 2 (Red)  Retraction  Theraband;15 reps    Theraband Level (Shoulder Retraction)  Level 2 (Red)      Shoulder Exercises: ROM/Strengthening   UBE (Upper Arm Bike)  Level 1 1' forward 1' reverse    Proximal Shoulder Strengthening, Supine  15X no rest breaks    Proximal Shoulder Strengthening, Seated  15X no rest breaks      Manual Therapy   Manual Therapy  Myofascial release    Manual therapy comments  Completed separately from therapeutic exercise    Myofascial Release  Myofascial release to left upper arm, trapezius, and scapularis regions to decrease pain and fascial restrictions and increase joint range of motion.                OT Short Term Goals - 09/12/17 1056      OT SHORT TERM GOAL #1   Title  Pt will be educated and independent in HEP to improve LUE mobility.     Time  4    Period  Weeks      OT SHORT TERM GOAL #2   Title  Pt will decrease pain in LUE to 5/10 during ADL completion.     Time  4    Period  Weeks      OT SHORT TERM GOAL #3   Title  Pt will improve LUE P/ROM to [facility] to  increase ability to perform dressing tasks without compensatory strategies.     Time  4    Period  Weeks      OT SHORT TERM GOAL #4   Title  Pt will improve LUE strength to 3/5 to improve ability to reach for items at waist and shoulder level.     Time  4    Period  Weeks    Status  Partially Met        OT Long Term Goals - 08/02/17 1029      OT LONG TERM GOAL #1   Title  Pt will return to highest level of functioning and independence in B/IADL completion using LUE as non-dominant.     Time  8    Period  Weeks    Status  On-going      OT LONG TERM GOAL #2   Title  Pt will decrease pain in LUE to 3/10 or less to improve use of LUE as non-dominant during B/ADL completion.    Time  8    Period  Weeks    Status  On-going      OT LONG TERM GOAL #3   Title  Pt will decrease fascial restrictions in LUE from moderate to minimal to improve mobility required for functional reaching tasks.     Time  8    Period  Weeks    Status  On-going      OT LONG TERM GOAL #4   Title  Pt will improve LUE A/ROM to [facility] to improve ability to reach overhead and behind back during bathing tasks.     Time  8    Period  Weeks    Status  On-going      OT LONG TERM GOAL #5   Title  Pt will improve LUE strength to 4+/5 to improve ability to use LUE as non-dominant during work tasks.     Time  8    Period  Weeks    Status  On-going            Plan - 09/21/17 1127  Clinical Impression Statement  A: Added UBE and increased scapular theraband repetitions to 15, occasional cues for form and technique. Pt reports mod fatigue at end of session, no increase in pain.     Plan  P: Add shoulder stretches and provide HEP if completing with good form    OT Home Exercise Plan  10/16: table sildes; 10/23: scapular A/ROM    Consulted and Agree with Plan of Care  Patient       Patient will benefit from skilled therapeutic intervention in order to improve the following deficits and impairments:  Decreased  activity tolerance, Decreased strength, Impaired flexibility, Decreased range of motion, Pain, Impaired UE functional use, Increased fascial restrictions  Visit Diagnosis: Stiffness of left shoulder, not elsewhere classified  Other symptoms and signs involving the musculoskeletal system  Acute pain of left shoulder    Problem List Patient Active Problem List   Diagnosis Date Noted  . Elevated transaminase level 09/21/2015  . Vitamin D deficiency 09/21/2015  . History of gastric bypass 09/21/2015  . Anemia, iron deficiency 09/21/2015  . Hypothyroidism 05/20/2014  . Rheumatoid arthritis ([place]) 07/11/2013  . Constipation 01/19/2011  . Abdominal discomfort in left lower quadrant 01/19/2011  . GERD 01/01/2010  . HIATAL HERNIA 01/01/2010  . ANAL FISTULA 01/01/2010  . ABDOMINAL PAIN, UNSPECIFIED SITE 01/01/2010   Guadelupe Sabin, OTR/L  [phone] 09/21/2017, 11:28 AM  [facility] [address] [place], [place], [address] Phone: [phone]   Fax:  [phone]  Name: JEZEBEL POLLET MRN: [id number] Date of Birth: Aug 24, 1961

## 2017-09-26 ENCOUNTER — Encounter (HOSPITAL_COMMUNITY): Payer: 59

## 2017-09-28 ENCOUNTER — Ambulatory Visit (HOSPITAL_COMMUNITY): Payer: 59 | Admitting: Occupational Therapy

## 2017-09-28 ENCOUNTER — Other Ambulatory Visit: Payer: Self-pay

## 2017-09-28 ENCOUNTER — Encounter (HOSPITAL_COMMUNITY): Payer: Self-pay | Admitting: Occupational Therapy

## 2017-09-28 DIAGNOSIS — M25612 Stiffness of left shoulder, not elsewhere classified: Secondary | ICD-10-CM

## 2017-09-28 DIAGNOSIS — R29898 Other symptoms and signs involving the musculoskeletal system: Secondary | ICD-10-CM

## 2017-09-28 DIAGNOSIS — M25512 Pain in left shoulder: Secondary | ICD-10-CM

## 2017-09-28 NOTE — Therapy (Signed)
[place], [place], [address] Phone: [phone]   Fax:  [phone]  Occupational Therapy Treatment  Patient Details  Name: [name] MRN: [id number] Date of Birth: August 26, 1961 Referring Provider (Historical): Dr. Elsie Saas   Encounter Date: 09/28/2017  OT End of Session - 09/28/17 1145    Visit Number  16    Number of Visits  24    Date for OT Re-Evaluation  10/28/17    Authorization Type  UHC    Authorization Time Period  60 visit limit combined, no visits used; $30 copay    Authorization - Visit Number  16    Authorization - Number of Visits  14    OT Start Time  1003 pt arrived late    OT Stop Time  1030    OT Time Calculation (min)  27 min    Activity Tolerance  Patient tolerated treatment well    Behavior During Therapy  [facility] for tasks assessed/performed       Past Medical History:  Diagnosis Date  . Abdominal pain of unknown etiology   . Anal fistula    hx of   . Arthritis   . Constipation    severe  . Depression   . GERD (gastroesophageal reflux disease)   . Hiatal hernia   . Hyperthyroidism   . Prediabetes   . S/P colonoscopy April 2011   Dr. Olevia Perches: mild diverticulosis, hyperplastic polyps, repeat in 10 years  . S/P endoscopy April 2011   Dr. Olevia Perches: no hiatal hernia, generous opening to antrum., patent gastrojejunostomy    Past Surgical History:  Procedure Laterality Date  . anal fistulostomy     w/marsupialization an excision of anal tags  . BREAST LUMPECTOMY     benign  . COLONOSCOPY    . ESOPHAGOGASTRODUODENOSCOPY    . GASTRIC BYPASS     mini gastric bypass in [place] reportedly    There were no vitals filed for this visit.  Subjective Assessment - 09/28/17 1019    Subjective   S: I'm trying to use this arm more.     Currently in Pain?  No/denies         [facility] OT Assessment - 09/28/17 1004      Assessment   Medical Diagnosis  s/p left shoulder  arthroscopy, debridement, SAD, DCR      Precautions   Precautions  Shoulder    Type of Shoulder Precautions  P/ROM and progress as tolerated      Palpation   Palpation comment  Minimal fascial restrictions in left upper arm, trapezius, and scapularis regions      AROM   Overall AROM Comments  Assessed supine. IR/er adducted. A/ROM not assessed priot to today.    AROM Assessment Site  Shoulder    Right/Left Shoulder  Left    Left Shoulder Flexion  151 Degrees 115 previous    Left Shoulder ABduction  138 Degrees 85 previous    Left Shoulder Internal Rotation  90 Degrees same as previous    Left Shoulder External Rotation  83 Degrees 50 previous      PROM   Overall PROM Comments  Assessed supine, er/IR adducted    PROM Assessment Site  Shoulder    Right/Left Shoulder  Left    Left Shoulder Flexion  163 Degrees 150 previous    Left Shoulder ABduction  161 Degrees 144 previous    Left Shoulder Internal Rotation  90 Degrees  same as previous    Left Shoulder External Rotation  80 Degrees 75 previous      Strength   Overall Strength Comments  Assessed seated. IR/er adducted    Strength Assessment Site  Shoulder    Right/Left Shoulder  Left    Left Shoulder Flexion  4-/5 3-/5 previous    Left Shoulder ABduction  3+/5 3-/5 previous    Left Shoulder Internal Rotation  4/5 3/5 previous    Left Shoulder External Rotation  3+/5 3/5 previous               OT Treatments/Exercises (OP) - 09/28/17 1019      Exercises   Exercises  Shoulder      Shoulder Exercises: Supine   Protraction  PROM;5 reps    Horizontal ABduction  PROM;5 reps    External Rotation  PROM;5 reps    Internal Rotation  PROM;5 reps    Flexion  PROM;5 reps    ABduction  PROM;5 reps      Shoulder Exercises: Standing   Protraction  AROM;15 reps    Horizontal ABduction  AROM;15 reps    External Rotation  AROM;15 reps    Internal Rotation  AROM;15 reps    Flexion  AROM;15 reps    ABduction  AROM;15 reps       Shoulder Exercises: Stretch   Corner Stretch  2 reps;10 seconds    Cross Chest Stretch  2 reps;10 seconds    Internal Rotation Stretch  2 reps 10 seconds; horizontal towel    External Rotation Stretch  2 reps;10 seconds    Wall Stretch - Flexion  2 reps;10 seconds             OT Education - 09/28/17 1021    Education provided  Yes    Education Details  shoulder stretches    Person(s) Educated  Patient    Methods  Explanation;Handout    Comprehension  Verbalized understanding;Returned demonstration       OT Short Term Goals - 09/28/17 1146      OT SHORT TERM GOAL #1   Title  Pt will be educated and independent in HEP to improve LUE mobility.     Time  4    Period  Weeks      OT SHORT TERM GOAL #2   Title  Pt will decrease pain in LUE to 5/10 during ADL completion.     Time  4    Period  Weeks      OT SHORT TERM GOAL #3   Title  Pt will improve LUE P/ROM to [facility] to increase ability to perform dressing tasks without compensatory strategies.     Time  4    Period  Weeks      OT SHORT TERM GOAL #4   Title  Pt will improve LUE strength to 3/5 to improve ability to reach for items at waist and shoulder level.     Time  4    Period  Weeks    Status  Achieved        OT Long Term Goals - 08/02/17 1029      OT LONG TERM GOAL #1   Title  Pt will return to highest level of functioning and independence in B/IADL completion using LUE as non-dominant.     Time  8    Period  Weeks    Status  On-going      OT LONG TERM GOAL #2   Title  Pt will decrease pain in LUE to 3/10 or less to improve use of LUE as non-dominant during B/ADL completion.    Time  8    Period  Weeks    Status  On-going      OT LONG TERM GOAL #3   Title  Pt will decrease fascial restrictions in LUE from moderate to minimal to improve mobility required for functional reaching tasks.     Time  8    Period  Weeks    Status  On-going      OT LONG TERM GOAL #4   Title  Pt will improve LUE A/ROM to  [facility] to improve ability to reach overhead and behind back during bathing tasks.     Time  8    Period  Weeks    Status  On-going      OT LONG TERM GOAL #5   Title  Pt will improve LUE strength to 4+/5 to improve ability to use LUE as non-dominant during work tasks.     Time  8    Period  Weeks    Status  On-going            Plan - 09/28/17 1208    Clinical Impression Statement  A: Reassessment completed today, pt has met all STGs and is progressing towards LTGs with improvements in ROM, strength, pain, and activity tolerance. Pt provided with shoulder stretches for HEP, completing with good form during therapy. Continues to experience fatigue during ROM exercises, occasional cuing for form and technique.     Rehab Potential  Good    OT Frequency  2x / week    OT Duration  4 weeks    OT Treatment/Interventions  Self-care/ADL training;Therapeutic exercise;Patient/family education;Ultrasound;Manual Therapy;Therapeutic activities;Cryotherapy;Electrical Stimulation;Passive range of motion;Moist Heat    Plan  P: Continue therapy 2x/week for 4 additional weeks working on improving ROM, strength, and functional use of LUE during daily tasks. Next session: follow up on HEP, continue with A/ROM progressing to strengthening when able to tolerate.     Consulted and Agree with Plan of Care  Patient       Patient will benefit from skilled therapeutic intervention in order to improve the following deficits and impairments:  Decreased activity tolerance, Decreased strength, Impaired flexibility, Decreased range of motion, Pain, Impaired UE functional use, Increased fascial restrictions  Visit Diagnosis: Stiffness of left shoulder, not elsewhere classified  Other symptoms and signs involving the musculoskeletal system  Acute pain of left shoulder    Problem List Patient Active Problem List   Diagnosis Date Noted  . Elevated transaminase level 09/21/2015  . Vitamin D deficiency 09/21/2015   . History of gastric bypass 09/21/2015  . Anemia, iron deficiency 09/21/2015  . Hypothyroidism 05/20/2014  . Rheumatoid arthritis ([place]) 07/11/2013  . Constipation 01/19/2011  . Abdominal discomfort in left lower quadrant 01/19/2011  . GERD 01/01/2010  . HIATAL HERNIA 01/01/2010  . ANAL FISTULA 01/01/2010  . ABDOMINAL PAIN, UNSPECIFIED SITE 01/01/2010   Guadelupe Sabin, OTR/L  [phone] 09/28/2017, 12:10 PM  [facility] [address] [place], [place], [address] Phone: [phone]   Fax:  [phone]  Name: AVERIE HORNBAKER MRN: [id number] Date of Birth: 19-Jan-1961

## 2017-09-28 NOTE — Patient Instructions (Signed)

## 2017-10-02 ENCOUNTER — Ambulatory Visit: Payer: 59 | Admitting: Podiatry

## 2017-10-02 ENCOUNTER — Encounter: Payer: Self-pay | Admitting: Podiatry

## 2017-10-02 ENCOUNTER — Ambulatory Visit (INDEPENDENT_AMBULATORY_CARE_PROVIDER_SITE_OTHER): Payer: 59

## 2017-10-02 DIAGNOSIS — M722 Plantar fascial fibromatosis: Secondary | ICD-10-CM

## 2017-10-03 ENCOUNTER — Encounter (HOSPITAL_COMMUNITY): Payer: Self-pay

## 2017-10-03 ENCOUNTER — Ambulatory Visit (HOSPITAL_COMMUNITY): Payer: 59

## 2017-10-03 DIAGNOSIS — M25612 Stiffness of left shoulder, not elsewhere classified: Secondary | ICD-10-CM | POA: Diagnosis not present

## 2017-10-03 DIAGNOSIS — R29898 Other symptoms and signs involving the musculoskeletal system: Secondary | ICD-10-CM

## 2017-10-03 DIAGNOSIS — M25512 Pain in left shoulder: Secondary | ICD-10-CM

## 2017-10-03 NOTE — Progress Notes (Signed)
Subjective:   Patient ID: [name], female   DOB: 56 y.o.   MRN: [id number]   HPI Patient presents stating she has had a 1 year history of severe pain in the left plantar heel.  She has had 3 injection she has had orthotics she has had night splint without relief of symptoms and it continues to be very bad and makes it hard for her to be active and she has tried reduced activity and physical therapy also without relief   Review of Systems  All other systems reviewed and are negative.       Objective:  Physical Exam  Constitutional: She appears well-developed and well-nourished.  Cardiovascular: Intact distal pulses.  Pulmonary/Chest: Effort normal.  Musculoskeletal: Normal range of motion.  Neurological: She is alert.  Skin: Skin is warm.  Nursing note and vitals reviewed.   Neurovascular status intact with muscle strength adequate range of motion within normal limits with patient found to have exquisite discomfort plantar aspect left heel at the insertional point of the tendon into the calcaneus with inflammation and fluid around the medial band.  Patient is noted to have good digital perfusion is well oriented x3 with moderate depression of the arch     Assessment:  Acute plantar fasciitis left at the insertional point of the tendon into the calcaneus     Plan:  H&P condition reviewed at great length.  At this point I do not recommend further injections as she has had 3 and I do not recommend other types of inserts as she is not been successful so far.  I did discuss at one point this probably can require surgical intervention due to year history and failure to respond to so many numerous conservative treatments but we are first going to try for several weeks complete immobilization with air fracture walker which was dispensed along with aggressive ice and oral anti-inflammatories.  Patient will be seen back in several weeks we will see the response and decide what will be  appropriate long-term  X-rays indicate spur with no indications of stress fracture or advanced arthritis

## 2017-10-03 NOTE — Therapy (Signed)
[place], [place], [address] Phone: [phone]   Fax:  [phone]  Occupational Therapy Treatment  Patient Details  Name: [name] MRN: [id number] Date of Birth: April 08, 1961 Referring Provider (Historical): Dr. Elsie Saas   Encounter Date: 10/03/2017  OT End of Session - 10/03/17 1228    Visit Number  17    Number of Visits  24    Date for OT Re-Evaluation  10/28/17    Authorization Type  [employer]    Authorization Time Period  60 visit limit combined, no visits used; $30 copay    Authorization - Visit Number  17    Authorization - Number of Visits  61    OT Start Time  [phone] pt arrived late    OT Stop Time  1030    OT Time Calculation (min)  35 min    Activity Tolerance  Patient tolerated treatment well    Behavior During Therapy  [facility] for tasks assessed/performed       Past Medical History:  Diagnosis Date  . Abdominal pain of unknown etiology   . Anal fistula    hx of   . Arthritis   . Constipation    severe  . Depression   . GERD (gastroesophageal reflux disease)   . Hiatal hernia   . Hyperthyroidism   . Prediabetes   . S/P colonoscopy April 2011   Dr. Olevia Perches: mild diverticulosis, hyperplastic polyps, repeat in 10 years  . S/P endoscopy April 2011   Dr. Olevia Perches: no hiatal hernia, generous opening to antrum., patent gastrojejunostomy    Past Surgical History:  Procedure Laterality Date  . anal fistulostomy     w/marsupialization an excision of anal tags  . BREAST LUMPECTOMY     benign  . COLONOSCOPY    . ESOPHAGOGASTRODUODENOSCOPY    . GASTRIC BYPASS     mini gastric bypass in [place] reportedly    There were no vitals filed for this visit.  Subjective Assessment - 10/03/17 1121    Subjective   S: I'm using my arm more so I think that's why it feels stiff.    Currently in Pain?  No/denies         [facility] OT Assessment - 10/03/17 1121      Assessment   Medical Diagnosis   s/p left shoulder arthroscopy, debridement, SAD, DCR      Precautions   Precautions  Shoulder    Type of Shoulder Precautions  P/ROM and progress as tolerated               OT Treatments/Exercises (OP) - 10/03/17 1121      Exercises   Exercises  Shoulder      Shoulder Exercises: Supine   Protraction  PROM;10 reps    Horizontal ABduction  PROM;10 reps    External Rotation  PROM;10 reps    Internal Rotation  PROM;10 reps    Flexion  PROM;10 reps    ABduction  PROM;10 reps      Shoulder Exercises: Seated   Protraction  Strengthening;10 reps    Protraction Weight (lbs)  1    Horizontal ABduction  Strengthening;10 reps    Horizontal ABduction Weight (lbs)  1    External Rotation  Strengthening;10 reps    External Rotation Weight (lbs)  1    Internal Rotation  Strengthening;10 reps    Internal Rotation Weight (lbs)  1    Flexion  Strengthening;10 reps  Flexion Weight (lbs)  1    Abduction  Strengthening;10 reps    ABduction Weight (lbs)  1      Shoulder Exercises: ROM/Strengthening   Proximal Shoulder Strengthening, Seated  10X with 1# no rest breaks      Manual Therapy   Manual Therapy  Myofascial release    Manual therapy comments  Completed separately from therapeutic exercise    Myofascial Release  Myofascial release to left upper arm, trapezius, and scapularis regions to decrease pain and fascial restrictions and increase joint range of motion.              OT Education - 10/03/17 1236    Education provided  Yes    Education Details  Pt instructed to add 1# weight, or water bottle, or can of soup to HEP for strengthening.    Person(s) Educated  Patient    Methods  Explanation    Comprehension  Verbalized understanding       OT Short Term Goals - 10/03/17 1237      OT SHORT TERM GOAL #1   Title  Pt will be educated and independent in HEP to improve LUE mobility.     Time  4    Period  Weeks      OT SHORT TERM GOAL #2   Title  Pt will  decrease pain in LUE to 5/10 during ADL completion.     Time  4    Period  Weeks      OT SHORT TERM GOAL #3   Title  Pt will improve LUE P/ROM to [facility] to increase ability to perform dressing tasks without compensatory strategies.     Time  4    Period  Weeks      OT SHORT TERM GOAL #4   Title  Pt will improve LUE strength to 3/5 to improve ability to reach for items at waist and shoulder level.     Time  4    Period  Weeks        OT Long Term Goals - 08/02/17 1029      OT LONG TERM GOAL #1   Title  Pt will return to highest level of functioning and independence in B/IADL completion using LUE as non-dominant.     Time  8    Period  Weeks    Status  On-going      OT LONG TERM GOAL #2   Title  Pt will decrease pain in LUE to 3/10 or less to improve use of LUE as non-dominant during B/ADL completion.    Time  8    Period  Weeks    Status  On-going      OT LONG TERM GOAL #3   Title  Pt will decrease fascial restrictions in LUE from moderate to minimal to improve mobility required for functional reaching tasks.     Time  8    Period  Weeks    Status  On-going      OT LONG TERM GOAL #4   Title  Pt will improve LUE A/ROM to [facility] to improve ability to reach overhead and behind back during bathing tasks.     Time  8    Period  Weeks    Status  On-going      OT LONG TERM GOAL #5   Title  Pt will improve LUE strength to 4+/5 to improve ability to use LUE as non-dominant during work tasks.  Time  8    Period  Weeks    Status  On-going            Plan - 10/03/17 1228    Clinical Impression Statement  A: Progressed to 1# handweights seated this session. Patient took rest breaks during horizontal abduction. VC for form and technique.    Plan  P: Complete strengthening supine with 1# weight. Add weight to proximal shoulder strengthening as well. Continue to progress as able to tolerate.    Consulted and Agree with Plan of Care  Patient       Patient will benefit from  skilled therapeutic intervention in order to improve the following deficits and impairments:  Decreased activity tolerance, Decreased strength, Impaired flexibility, Decreased range of motion, Pain, Impaired UE functional use, Increased fascial restrictions  Visit Diagnosis: Stiffness of left shoulder, not elsewhere classified  Other symptoms and signs involving the musculoskeletal system  Acute pain of left shoulder    Problem List Patient Active Problem List   Diagnosis Date Noted  . Elevated transaminase level 09/21/2015  . Vitamin D deficiency 09/21/2015  . History of gastric bypass 09/21/2015  . Anemia, iron deficiency 09/21/2015  . Hypothyroidism 05/20/2014  . Rheumatoid arthritis ([place]) 07/11/2013  . Constipation 01/19/2011  . Abdominal discomfort in left lower quadrant 01/19/2011  . GERD 01/01/2010  . HIATAL HERNIA 01/01/2010  . ANAL FISTULA 01/01/2010  . ABDOMINAL PAIN, UNSPECIFIED SITE 01/01/2010   [name], OTR/L,CBIS  [phone]  10/03/2017, 12:38 PM  [place] [address] [place], [place], [address] Phone: [phone]   Fax:  [phone]  Name: IYESHA SUCH MRN: [id number] Date of Birth: 1960-11-25

## 2017-10-04 DIAGNOSIS — L723 Sebaceous cyst: Secondary | ICD-10-CM | POA: Diagnosis not present

## 2017-10-04 DIAGNOSIS — L719 Rosacea, unspecified: Secondary | ICD-10-CM | POA: Diagnosis not present

## 2017-10-05 DIAGNOSIS — Z79899 Other long term (current) drug therapy: Secondary | ICD-10-CM | POA: Diagnosis not present

## 2017-10-05 DIAGNOSIS — M0579 Rheumatoid arthritis with rheumatoid factor of multiple sites without organ or systems involvement: Secondary | ICD-10-CM | POA: Diagnosis not present

## 2017-10-05 DIAGNOSIS — M7582 Other shoulder lesions, left shoulder: Secondary | ICD-10-CM | POA: Diagnosis not present

## 2017-10-11 ENCOUNTER — Telehealth: Payer: Self-pay | Admitting: Family Medicine

## 2017-10-11 DIAGNOSIS — M25551 Pain in right hip: Secondary | ICD-10-CM

## 2017-10-11 NOTE — Telephone Encounter (Signed)
Patient called wanting a referral to [facility] in [place] for rt hip pain that radiates down the leg. This been going on for two weeks. She has tried prednisone prescribed by her Rheumatologist but has not improved. She would like to see ortho to evaluate and treat. Her call back number is [phone]

## 2017-10-11 NOTE — Telephone Encounter (Signed)
It would be fine to go ahead and refer the patient

## 2017-10-11 NOTE — Telephone Encounter (Signed)
Tried calling pt to get more info, No answer. Please advise may we refer?

## 2017-10-12 ENCOUNTER — Encounter: Payer: Self-pay | Admitting: Family Medicine

## 2017-10-12 ENCOUNTER — Ambulatory Visit: Payer: 59 | Admitting: Family Medicine

## 2017-10-12 ENCOUNTER — Ambulatory Visit (HOSPITAL_COMMUNITY)
Admission: RE | Admit: 2017-10-12 | Discharge: 2017-10-12 | Disposition: A | Discharge status: Home / Self Care | Payer: 59 | Source: Ambulatory Visit | Attending: Family Medicine | Admitting: Family Medicine

## 2017-10-12 VITALS — BP 122/78 | Ht 64.0 in | Wt 204.0 lb

## 2017-10-12 DIAGNOSIS — I700 Atherosclerosis of aorta: Secondary | ICD-10-CM | POA: Insufficient documentation

## 2017-10-12 DIAGNOSIS — M5431 Sciatica, right side: Secondary | ICD-10-CM | POA: Diagnosis not present

## 2017-10-12 DIAGNOSIS — M545 Low back pain: Secondary | ICD-10-CM | POA: Diagnosis not present

## 2017-10-12 DIAGNOSIS — R29898 Other symptoms and signs involving the musculoskeletal system: Secondary | ICD-10-CM

## 2017-10-12 DIAGNOSIS — M2141 Flat foot [pes planus] (acquired), right foot: Secondary | ICD-10-CM

## 2017-10-12 MED ORDER — OXYCODONE-ACETAMINOPHEN 5-325 MG PO TABS: 1.0000 | ORAL_TABLET | ORAL | 0 refills | Status: DC | PRN

## 2017-10-12 NOTE — Progress Notes (Signed)
   Subjective:    Patient ID: [name], female    DOB: Mar 25, 1961, 56 y.o.   MRN: [id number]  HPI Patient arrives with c/o right hip pain for a while. He leg feels like it is getting numb- took a week of prednisone from rheumatologist but it didn't help. Was wearing boot on the left foot and didn't know if it flared something. The patient relates that she has been having significant pain in the right buttock hip area for the past several weeks radiating down the leg she has had previous sciatica back in 2016 she is tried anti-inflammatory stretching rest she even took a round of prednisone that her rheumatologist prescribed for her plantar fasciitis none of this is helped her leg now she states her leg is getting worse she relates numbness and tingling she also relates some weakness in her leg with difficulty walking and moving compared to usual she denies any loss of bowel or bladder control  Review of Systems Complains of numbness in the leg weakness and severe pain denies chest pain shortness of breath    Objective:   Physical Exam Lungs are clear heart regular pulse normal positive straight leg raise on the right she also has positive straight leg raise on the left causes pain on the right side the patient has difficulty walking on her heels on the right side she can walk on her toes strength compared to left and right shows her great toe cannot extend as well as the left side      Assessment & Plan:  Sciatica with weakness plain x-rays ordered patient will need MRI patient will need consultation with specialist await the results  Patient has been having sciatica off and on for past couple years worse over the past few weeks with now with some weakness and numbness MRI indicated  Pain medication prescribed for infrequent use cautioned drowsiness

## 2017-10-12 NOTE — Telephone Encounter (Signed)
Referral ordered in Epic. 

## 2017-10-13 ENCOUNTER — Ambulatory Visit (HOSPITAL_COMMUNITY): Payer: 59 | Admitting: Occupational Therapy

## 2017-10-13 ENCOUNTER — Other Ambulatory Visit: Payer: Self-pay

## 2017-10-13 ENCOUNTER — Encounter (HOSPITAL_COMMUNITY): Payer: Self-pay | Admitting: Occupational Therapy

## 2017-10-13 DIAGNOSIS — M25612 Stiffness of left shoulder, not elsewhere classified: Secondary | ICD-10-CM | POA: Diagnosis not present

## 2017-10-13 DIAGNOSIS — R29898 Other symptoms and signs involving the musculoskeletal system: Secondary | ICD-10-CM

## 2017-10-13 DIAGNOSIS — M25512 Pain in left shoulder: Secondary | ICD-10-CM

## 2017-10-13 NOTE — Therapy (Signed)
[place], [place], [address] Phone: [phone]   Fax:  [phone]  Occupational Therapy Treatment  Patient Details  Name: [name] MRN: [id number] Date of Birth: Jul 31, 1961 Referring Provider (Historical): Dr. Elsie Saas   Encounter Date: 10/13/2017  OT End of Session - 10/13/17 1057    Visit Number  18    Number of Visits  24    Date for OT Re-Evaluation  10/28/17    Authorization Type  [employer]    Authorization Time Period  60 visit limit combined, no visits used; $30 copay    Authorization - Visit Number  18    Authorization - Number of Visits  27    OT Start Time  [phone] pt arrived late    OT Stop Time  1030    OT Time Calculation (min)  34 min    Activity Tolerance  Patient tolerated treatment well    Behavior During Therapy  [facility] for tasks assessed/performed       Past Medical History:  Diagnosis Date  . Abdominal pain of unknown etiology   . Anal fistula    hx of   . Arthritis   . Constipation    severe  . Depression   . GERD (gastroesophageal reflux disease)   . Hiatal hernia   . Hyperthyroidism   . Prediabetes   . S/P colonoscopy April 2011   Dr. Olevia Perches: mild diverticulosis, hyperplastic polyps, repeat in 10 years  . S/P endoscopy April 2011   Dr. Olevia Perches: no hiatal hernia, generous opening to antrum., patent gastrojejunostomy    Past Surgical History:  Procedure Laterality Date  . anal fistulostomy     w/marsupialization an excision of anal tags  . BREAST LUMPECTOMY     benign  . COLONOSCOPY    . ESOPHAGOGASTRODUODENOSCOPY    . GASTRIC BYPASS     mini gastric bypass in [place] reportedly    There were no vitals filed for this visit.  Subjective Assessment - 10/13/17 0958    Subjective   S: My arm did pretty well over Christmas.     Currently in Pain?  No/denies         [facility] OT Assessment - 10/13/17 0957      Assessment   Medical Diagnosis  s/p left shoulder  arthroscopy, debridement, SAD, DCR      Precautions   Precautions  Shoulder    Type of Shoulder Precautions  P/ROM and progress as tolerated               OT Treatments/Exercises (OP) - 10/13/17 0958      Exercises   Exercises  Shoulder      Shoulder Exercises: Supine   Protraction  PROM;5 reps;Strengthening;12 reps    Protraction Weight (lbs)  1    Horizontal ABduction  PROM;5 reps;Strengthening;12 reps    Horizontal ABduction Weight (lbs)  1    External Rotation  PROM;5 reps;Strengthening;12 reps    External Rotation Weight (lbs)  1    Internal Rotation  PROM;5 reps;Strengthening;12 reps    Internal Rotation Weight (lbs)  1    Flexion  PROM;5 reps;Strengthening;12 reps    Shoulder Flexion Weight (lbs)  1    ABduction  PROM;5 reps;Strengthening;12 reps    Shoulder ABduction Weight (lbs)  1      Shoulder Exercises: Seated   Protraction  Strengthening;10 reps    Protraction Weight (lbs)  1    Horizontal ABduction  Strengthening;10 reps    Horizontal ABduction Weight (lbs)  1    External Rotation  Strengthening;10 reps    External Rotation Weight (lbs)  1    Internal Rotation  Strengthening;10 reps    Internal Rotation Weight (lbs)  1    Flexion  Strengthening;10 reps    Flexion Weight (lbs)  1    Abduction  Strengthening;10 reps    ABduction Weight (lbs)  1      Shoulder Exercises: ROM/Strengthening   X to V Arms  10X    Proximal Shoulder Strengthening, Supine  10X each 1# weight, no rest breaks    Proximal Shoulder Strengthening, Seated  10X with 1# no rest breaks               OT Short Term Goals - 10/03/17 1237      OT SHORT TERM GOAL #1   Title  Pt will be educated and independent in HEP to improve LUE mobility.     Time  4    Period  Weeks      OT SHORT TERM GOAL #2   Title  Pt will decrease pain in LUE to 5/10 during ADL completion.     Time  4    Period  Weeks      OT SHORT TERM GOAL #3   Title  Pt will improve LUE P/ROM to [facility] to  increase ability to perform dressing tasks without compensatory strategies.     Time  4    Period  Weeks      OT SHORT TERM GOAL #4   Title  Pt will improve LUE strength to 3/5 to improve ability to reach for items at waist and shoulder level.     Time  4    Period  Weeks        OT Long Term Goals - 08/02/17 1029      OT LONG TERM GOAL #1   Title  Pt will return to highest level of functioning and independence in B/IADL completion using LUE as non-dominant.     Time  8    Period  Weeks    Status  On-going      OT LONG TERM GOAL #2   Title  Pt will decrease pain in LUE to 3/10 or less to improve use of LUE as non-dominant during B/ADL completion.    Time  8    Period  Weeks    Status  On-going      OT LONG TERM GOAL #3   Title  Pt will decrease fascial restrictions in LUE from moderate to minimal to improve mobility required for functional reaching tasks.     Time  8    Period  Weeks    Status  On-going      OT LONG TERM GOAL #4   Title  Pt will improve LUE A/ROM to [facility] to improve ability to reach overhead and behind back during bathing tasks.     Time  8    Period  Weeks    Status  On-going      OT LONG TERM GOAL #5   Title  Pt will improve LUE strength to 4+/5 to improve ability to use LUE as non-dominant during work tasks.     Time  8    Period  Weeks    Status  On-going            Plan - 10/13/17 1005    Clinical Impression  Statement  A: Pt arrived late, therefore manual therapy not completed this session. Pt with ROM WNL, minimal tightness at end range. Continued with strengthening exercises this session, intermittent verbal cuing for form and technique. Added weight to supine exercises and proximal shoulder strengthening, added x to v arms. Moderate fatigue at end of session.     Plan  P: Continue with strengthening, add overhead lacing. Resume therabands    OT Home Exercise Plan  10/16: table sildes; 10/23: scapular A/ROM    Consulted and Agree with Plan  of Care  Patient       Patient will benefit from skilled therapeutic intervention in order to improve the following deficits and impairments:  Decreased activity tolerance, Decreased strength, Impaired flexibility, Decreased range of motion, Pain, Impaired UE functional use, Increased fascial restrictions  Visit Diagnosis: Stiffness of left shoulder, not elsewhere classified  Other symptoms and signs involving the musculoskeletal system  Acute pain of left shoulder    Problem List Patient Active Problem List   Diagnosis Date Noted  . Elevated transaminase level 09/21/2015  . Vitamin D deficiency 09/21/2015  . History of gastric bypass 09/21/2015  . Anemia, iron deficiency 09/21/2015  . Hypothyroidism 05/20/2014  . Rheumatoid arthritis ([place]) 07/11/2013  . Constipation 01/19/2011  . Abdominal discomfort in left lower quadrant 01/19/2011  . GERD 01/01/2010  . HIATAL HERNIA 01/01/2010  . ANAL FISTULA 01/01/2010  . ABDOMINAL PAIN, UNSPECIFIED SITE 01/01/2010   Guadelupe Sabin, OTR/L  [phone] 10/13/2017, 10:58 AM  [place] [address] [place], [place], [address] Phone: [phone]   Fax:  [phone]  Name: [name] MRN: [id number] Date of Birth: 31-Mar-1961

## 2017-10-16 ENCOUNTER — Encounter: Payer: Self-pay | Admitting: Podiatry

## 2017-10-16 ENCOUNTER — Ambulatory Visit: Payer: 59 | Admitting: Podiatry

## 2017-10-16 ENCOUNTER — Telehealth: Payer: Self-pay | Admitting: Family Medicine

## 2017-10-16 DIAGNOSIS — M722 Plantar fascial fibromatosis: Secondary | ICD-10-CM | POA: Diagnosis not present

## 2017-10-16 MED ORDER — CYCLOBENZAPRINE HCL 10 MG PO TABS: 10.0000 mg | ORAL_TABLET | Freq: Three times a day (TID) | ORAL | 0 refills | Status: DC | PRN

## 2017-10-16 NOTE — Telephone Encounter (Signed)
There are 2 referrals for this patient  Ortho for right hip pain & neurosurgery for right side sciatica  Are both necessary?  Please advise  (Prior Josem Kaufmann was successfully obtained for lumbar MRI)

## 2017-10-16 NOTE — Addendum Note (Signed)
Addended by: Karle Barr on: 10/16/2017 12:11 PM   Modules accepted: Orders

## 2017-10-16 NOTE — Progress Notes (Signed)
Subjective:   Patient ID: [name], female   DOB: 56 y.o.   MRN: [id number]   HPI Patient presents stating her left heel is still very sore but now she has a lot of burning in her right leg with weakness and she is due to have MRI with possibility for back surgery   ROS      Objective:  Physical Exam  Neurovascular status intact with exquisite discomfort plantar aspect left heel and discomfort going down the right leg into the right foot with no indication of muscle weakness     Assessment:  Plantar fasciitis left acute nature with right possibility for back issues     Plan:  H&P condition reviewed and discussed probable surgery for the left but I want to find out first what happens with the right before making that determination.  Patient will be seen back in 3 weeks after she has MRI of the right and find out what the prognosis is

## 2017-10-16 NOTE — Telephone Encounter (Signed)
I recommend just the neurosurgery

## 2017-10-17 HISTORY — PX: BACK SURGERY: SHX140

## 2017-10-18 MED ORDER — LORAZEPAM 0.5 MG PO TABS: ORAL_TABLET | ORAL | 0 refills | Status: DC

## 2017-10-18 NOTE — Addendum Note (Signed)
Addended by: Karle Barr on: 10/18/2017 04:12 PM   Modules accepted: Orders

## 2017-10-19 ENCOUNTER — Encounter (HOSPITAL_COMMUNITY): Payer: Self-pay

## 2017-10-19 ENCOUNTER — Ambulatory Visit (HOSPITAL_COMMUNITY): Payer: 59 | Attending: Orthopedic Surgery

## 2017-10-19 DIAGNOSIS — R29898 Other symptoms and signs involving the musculoskeletal system: Secondary | ICD-10-CM | POA: Diagnosis not present

## 2017-10-19 DIAGNOSIS — M25612 Stiffness of left shoulder, not elsewhere classified: Secondary | ICD-10-CM | POA: Insufficient documentation

## 2017-10-19 DIAGNOSIS — M25512 Pain in left shoulder: Secondary | ICD-10-CM

## 2017-10-19 NOTE — Therapy (Signed)
[place], [place], [address] Phone: [phone]   Fax:  [phone]  Occupational Therapy Treatment  Patient Details  Name: [name] MRN: [id number] Date of Birth: [birth year] Referring Provider (Historical): Dr. Elsie Saas   Encounter Date: 10/19/2017  OT End of Session - 10/19/17 1055    Visit Number  19    Number of Visits  24    Date for OT Re-Evaluation  10/28/17    Authorization Type  [employer]    Authorization Time Period  60 visit limit combined, no visits used; $30 copay. Coverage per calendar year.    Authorization - Visit Number  1    Authorization - Number of Visits  34    OT Start Time  [phone]    OT Stop Time  1030    OT Time Calculation (min)  40 min    Activity Tolerance  Patient tolerated treatment well    Behavior During Therapy  WFL for tasks assessed/performed       Past Medical History:  Diagnosis Date  . Abdominal pain of unknown etiology   . Anal fistula    hx of   . Arthritis   . Constipation    severe  . Depression   . GERD (gastroesophageal reflux disease)   . Hiatal hernia   . Hyperthyroidism   . Prediabetes   . S/P colonoscopy April 2011   Dr. Olevia Perches: mild diverticulosis, hyperplastic polyps, repeat in 10 years  . S/P endoscopy April 2011   Dr. Olevia Perches: no hiatal hernia, generous opening to antrum., patent gastrojejunostomy    Past Surgical History:  Procedure Laterality Date  . anal fistulostomy     w/marsupialization an excision of anal tags  . BREAST LUMPECTOMY     benign  . COLONOSCOPY    . ESOPHAGOGASTRODUODENOSCOPY    . GASTRIC BYPASS     mini gastric bypass in [place] reportedly    There were no vitals filed for this visit.  Subjective Assessment - 10/19/17 1012    Subjective   S: I'm just a little sore.    Currently in Pain?  Yes    Pain Score  2     Pain Location  Shoulder    Pain Orientation  Left    Pain Descriptors / Indicators  Sore    Pain  Type  Acute pain    Pain Radiating Towards  N/A    Pain Onset  In the past 7 days    Pain Frequency  Intermittent    Aggravating Factors   new strengthening exercises    Pain Relieving Factors  rest, ice    Effect of Pain on Daily Activities  min effect    Multiple Pain Sites  No         [facility] OT Assessment - 10/19/17 1014      Assessment   Medical Diagnosis  s/p left shoulder arthroscopy, debridement, SAD, DCR      Precautions   Precautions  Shoulder    Type of Shoulder Precautions  P/ROM and progress as tolerated               OT Treatments/Exercises (OP) - 10/19/17 1014      Exercises   Exercises  Shoulder      Shoulder Exercises: Supine   Protraction  PROM;5 reps;Strengthening;10 reps    Protraction Weight (lbs)  2    Horizontal ABduction  PROM;5 reps;Strengthening;12 reps    Horizontal  ABduction Weight (lbs)  1    External Rotation  PROM;5 reps;Strengthening;10 reps    External Rotation Weight (lbs)  2    Internal Rotation  PROM;5 reps;Strengthening;10 reps    Internal Rotation Weight (lbs)  2    Flexion  PROM;5 reps;Strengthening;10 reps    Shoulder Flexion Weight (lbs)  2    ABduction  PROM;5 reps;Strengthening;12 reps    Shoulder ABduction Weight (lbs)  1      Shoulder Exercises: Seated   Protraction  Strengthening;12 reps    Protraction Weight (lbs)  1    Horizontal ABduction  Strengthening;12 reps    Horizontal ABduction Weight (lbs)  1    External Rotation  Strengthening;12 reps    External Rotation Weight (lbs)  1    Internal Rotation  Strengthening;12 reps    Internal Rotation Weight (lbs)  1    Flexion  Strengthening;12 reps    Flexion Weight (lbs)  1    Abduction  Strengthening;12 reps    ABduction Weight (lbs)  1      Shoulder Exercises: ROM/Strengthening   Over Head Lace  2'    Proximal Shoulder Strengthening, Supine  12X each 1# weight, no rest breaks    Proximal Shoulder Strengthening, Seated  10X with 1# no rest breaks       Manual Therapy   Manual Therapy  Myofascial release    Manual therapy comments  Completed separately from therapeutic exercise    Myofascial Release  Myofascial release to left upper arm, trapezius, and scapularis regions to decrease pain and fascial restrictions and increase joint range of motion.                OT Short Term Goals - 10/03/17 1237      OT SHORT TERM GOAL #1   Title  Pt will be educated and independent in HEP to improve LUE mobility.     Time  4    Period  Weeks      OT SHORT TERM GOAL #2   Title  Pt will decrease pain in LUE to 5/10 during ADL completion.     Time  4    Period  Weeks      OT SHORT TERM GOAL #3   Title  Pt will improve LUE P/ROM to [facility] to increase ability to perform dressing tasks without compensatory strategies.     Time  4    Period  Weeks      OT SHORT TERM GOAL #4   Title  Pt will improve LUE strength to 3/5 to improve ability to reach for items at waist and shoulder level.     Time  4    Period  Weeks        OT Long Term Goals - 08/02/17 1029      OT LONG TERM GOAL #1   Title  Pt will return to highest level of functioning and independence in B/IADL completion using LUE as non-dominant.     Time  8    Period  Weeks    Status  On-going      OT LONG TERM GOAL #2   Title  Pt will decrease pain in LUE to 3/10 or less to improve use of LUE as non-dominant during B/ADL completion.    Time  8    Period  Weeks    Status  On-going      OT LONG TERM GOAL #3   Title  Pt will decrease fascial  restrictions in LUE from moderate to minimal to improve mobility required for functional reaching tasks.     Time  8    Period  Weeks    Status  On-going      OT LONG TERM GOAL #4   Title  Pt will improve LUE A/ROM to [facility] to improve ability to reach overhead and behind back during bathing tasks.     Time  8    Period  Weeks    Status  On-going      OT LONG TERM GOAL #5   Title  Pt will improve LUE strength to 4+/5 to improve ability to  use LUE as non-dominant during work tasks.     Time  8    Period  Weeks    Status  On-going            Plan - 10/19/17 1057    Clinical Impression Statement  A: Pt completed some strengthening with 2# weights supine. Continued with 1# weights seated and increased repetitions to 12. Added overhead lacing for scapular stability. VC for form and technique.    Plan  P: Add ball on the wall. Add external rotation stretch.    Consulted and Agree with Plan of Care  Patient       Patient will benefit from skilled therapeutic intervention in order to improve the following deficits and impairments:  Decreased activity tolerance, Decreased strength, Impaired flexibility, Decreased range of motion, Pain, Impaired UE functional use, Increased fascial restrictions  Visit Diagnosis: Stiffness of left shoulder, not elsewhere classified  Other symptoms and signs involving the musculoskeletal system  Acute pain of left shoulder    Problem List Patient Active Problem List   Diagnosis Date Noted  . Elevated transaminase level 09/21/2015  . Vitamin D deficiency 09/21/2015  . History of gastric bypass 09/21/2015  . Anemia, iron deficiency 09/21/2015  . Hypothyroidism 05/20/2014  . Rheumatoid arthritis ([place]) 07/11/2013  . Constipation 01/19/2011  . Abdominal discomfort in left lower quadrant 01/19/2011  . GERD 01/01/2010  . HIATAL HERNIA 01/01/2010  . ANAL FISTULA 01/01/2010  . ABDOMINAL PAIN, UNSPECIFIED SITE 01/01/2010   [name], OTR/L,CBIS  [phone]  10/19/2017, 11:01 AM  [facility] [address] [place], [place], [address] Phone: [phone]   Fax:  [phone]  Name: LAWSYN HEILER MRN: [id number] Date of Birth: 06/07/1961

## 2017-10-20 ENCOUNTER — Ambulatory Visit (HOSPITAL_COMMUNITY)
Admission: RE | Admit: 2017-10-20 | Discharge: 2017-10-20 | Disposition: A | Discharge status: Home / Self Care | Payer: 59 | Source: Ambulatory Visit | Attending: Family Medicine | Admitting: Family Medicine

## 2017-10-20 DIAGNOSIS — M5431 Sciatica, right side: Secondary | ICD-10-CM

## 2017-10-20 DIAGNOSIS — M2141 Flat foot [pes planus] (acquired), right foot: Secondary | ICD-10-CM | POA: Diagnosis not present

## 2017-10-20 DIAGNOSIS — M5126 Other intervertebral disc displacement, lumbar region: Secondary | ICD-10-CM | POA: Diagnosis not present

## 2017-10-20 DIAGNOSIS — M5127 Other intervertebral disc displacement, lumbosacral region: Secondary | ICD-10-CM | POA: Diagnosis not present

## 2017-10-20 DIAGNOSIS — M545 Low back pain: Secondary | ICD-10-CM | POA: Diagnosis not present

## 2017-10-20 DIAGNOSIS — R29898 Other symptoms and signs involving the musculoskeletal system: Secondary | ICD-10-CM

## 2017-10-23 ENCOUNTER — Encounter: Payer: Self-pay | Admitting: Family Medicine

## 2017-10-24 ENCOUNTER — Ambulatory Visit (HOSPITAL_COMMUNITY): Payer: 59

## 2017-10-24 DIAGNOSIS — M25512 Pain in left shoulder: Secondary | ICD-10-CM

## 2017-10-24 DIAGNOSIS — R29898 Other symptoms and signs involving the musculoskeletal system: Secondary | ICD-10-CM

## 2017-10-24 DIAGNOSIS — M25612 Stiffness of left shoulder, not elsewhere classified: Secondary | ICD-10-CM | POA: Diagnosis not present

## 2017-10-24 NOTE — Therapy (Signed)
[place], [place], [address] Phone: [phone]   Fax:  [phone]  Occupational Therapy Treatment  Patient Details  Name: [name] MRN: [id number] Date of Birth: [birth year] Referring Provider (Historical): Dr. Elsie Saas   Encounter Date: 10/24/2017  OT End of Session - 10/24/17 1139    Visit Number  20    Number of Visits  24    Date for OT Re-Evaluation  10/28/17    Authorization Type  [employer]    Authorization Time Period  60 visit limit combined, no visits used; $30 copay. Coverage per calendar year.    Authorization - Visit Number  2    Authorization - Number of Visits  19    OT Start Time  [phone]    OT Stop Time  1030    OT Time Calculation (min)  39 min    Activity Tolerance  Patient tolerated treatment well    Behavior During Therapy  WFL for tasks assessed/performed       Past Medical History:  Diagnosis Date  . Abdominal pain of unknown etiology   . Anal fistula    hx of   . Arthritis   . Constipation    severe  . Depression   . GERD (gastroesophageal reflux disease)   . Hiatal hernia   . Hyperthyroidism   . Prediabetes   . S/P colonoscopy April 2011   Dr. Olevia Perches: mild diverticulosis, hyperplastic polyps, repeat in 10 years  . S/P endoscopy April 2011   Dr. Olevia Perches: no hiatal hernia, generous opening to antrum., patent gastrojejunostomy    Past Surgical History:  Procedure Laterality Date  . anal fistulostomy     w/marsupialization an excision of anal tags  . BREAST LUMPECTOMY     benign  . COLONOSCOPY    . ESOPHAGOGASTRODUODENOSCOPY    . GASTRIC BYPASS     mini gastric bypass in [place] reportedly    There were no vitals filed for this visit.  Subjective Assessment - 10/24/17 1138    Subjective   S: It's amazing what a pound feels like.     Currently in Pain?  Yes    Pain Score  2     Pain Location  Shoulder    Pain Orientation  Left    Pain Descriptors / Indicators   Sore    Pain Type  Acute pain         [facility] OT Assessment - 10/24/17 1028      Assessment   Medical Diagnosis  s/p left shoulder arthroscopy, debridement, SAD, DCR      Precautions   Precautions  Shoulder    Type of Shoulder Precautions  P/ROM and progress as tolerated               OT Treatments/Exercises (OP) - 10/24/17 1028      Exercises   Exercises  Shoulder      Shoulder Exercises: Supine   Protraction  PROM;5 reps;Strengthening;10 reps    Protraction Weight (lbs)  2    Horizontal ABduction  PROM;5 reps;Strengthening;10 reps    Horizontal ABduction Weight (lbs)  2    External Rotation  PROM;5 reps;Strengthening;10 reps    External Rotation Weight (lbs)  2    Internal Rotation  PROM;5 reps;Strengthening;10 reps    Internal Rotation Weight (lbs)  2    Flexion  PROM;5 reps;Strengthening;10 reps    Shoulder Flexion Weight (lbs)  2    ABduction  PROM;5 reps;Strengthening;10 reps    Shoulder ABduction Weight (lbs)  2      Shoulder Exercises: ROM/Strengthening   Proximal Shoulder Strengthening, Seated  10X with 2# no rest breaks    Ball on Wall  1' flexion green ball      Manual Therapy   Manual Therapy  Myofascial release    Manual therapy comments  Completed separately from therapeutic exercise    Myofascial Release  Myofascial release to left upper arm, trapezius, and scapularis regions to decrease pain and fascial restrictions and increase joint range of motion.                OT Short Term Goals - 10/03/17 1237      OT SHORT TERM GOAL #1   Title  Pt will be educated and independent in HEP to improve LUE mobility.     Time  4    Period  Weeks      OT SHORT TERM GOAL #2   Title  Pt will decrease pain in LUE to 5/10 during ADL completion.     Time  4    Period  Weeks      OT SHORT TERM GOAL #3   Title  Pt will improve LUE P/ROM to [facility] to increase ability to perform dressing tasks without compensatory strategies.     Time  4    Period   Weeks      OT SHORT TERM GOAL #4   Title  Pt will improve LUE strength to 3/5 to improve ability to reach for items at waist and shoulder level.     Time  4    Period  Weeks        OT Long Term Goals - 08/02/17 1029      OT LONG TERM GOAL #1   Title  Pt will return to highest level of functioning and independence in B/IADL completion using LUE as non-dominant.     Time  8    Period  Weeks    Status  On-going      OT LONG TERM GOAL #2   Title  Pt will decrease pain in LUE to 3/10 or less to improve use of LUE as non-dominant during B/ADL completion.    Time  8    Period  Weeks    Status  On-going      OT LONG TERM GOAL #3   Title  Pt will decrease fascial restrictions in LUE from moderate to minimal to improve mobility required for functional reaching tasks.     Time  8    Period  Weeks    Status  On-going      OT LONG TERM GOAL #4   Title  Pt will improve LUE A/ROM to [facility] to improve ability to reach overhead and behind back during bathing tasks.     Time  8    Period  Weeks    Status  On-going      OT LONG TERM GOAL #5   Title  Pt will improve LUE strength to 4+/5 to improve ability to use LUE as non-dominant during work tasks.     Time  8    Period  Weeks    Status  On-going            Plan - 10/24/17 1139    Clinical Impression Statement  A: Progressed to 2# for all supine exercises as well as seated. Added ball on the wall for flexion.  VC for form and technique. Muscle fatigue noted with weight increase.    Plan  P: Complete ball on the wall for abduction.    Consulted and Agree with Plan of Care  Patient       Patient will benefit from skilled therapeutic intervention in order to improve the following deficits and impairments:  Decreased activity tolerance, Decreased strength, Impaired flexibility, Decreased range of motion, Pain, Impaired UE functional use, Increased fascial restrictions  Visit Diagnosis: Stiffness of left shoulder, not elsewhere  classified  Other symptoms and signs involving the musculoskeletal system  Acute pain of left shoulder    Problem List Patient Active Problem List   Diagnosis Date Noted  . Elevated transaminase level 09/21/2015  . Vitamin D deficiency 09/21/2015  . History of gastric bypass 09/21/2015  . Anemia, iron deficiency 09/21/2015  . Hypothyroidism 05/20/2014  . Rheumatoid arthritis ([place]) 07/11/2013  . Constipation 01/19/2011  . Abdominal discomfort in left lower quadrant 01/19/2011  . GERD 01/01/2010  . HIATAL HERNIA 01/01/2010  . ANAL FISTULA 01/01/2010  . ABDOMINAL PAIN, UNSPECIFIED SITE 01/01/2010   [name], OTR/L,CBIS  [phone]  10/24/2017, 11:41 AM  [facility] [address] [place], [place], [address] Phone: [phone]   Fax:  [phone]  Name: [name] MRN: [id number] Date of Birth: 07/10/1961

## 2017-10-26 ENCOUNTER — Encounter (HOSPITAL_COMMUNITY): Payer: Self-pay

## 2017-10-26 ENCOUNTER — Ambulatory Visit (HOSPITAL_COMMUNITY): Payer: 59

## 2017-10-26 DIAGNOSIS — R29898 Other symptoms and signs involving the musculoskeletal system: Secondary | ICD-10-CM

## 2017-10-26 DIAGNOSIS — M25612 Stiffness of left shoulder, not elsewhere classified: Secondary | ICD-10-CM | POA: Diagnosis not present

## 2017-10-26 DIAGNOSIS — M25512 Pain in left shoulder: Secondary | ICD-10-CM

## 2017-10-26 NOTE — Therapy (Signed)
[place], [place], [address] Phone: [phone]   Fax:  [phone]  Occupational Therapy Treatment And reassessment Patient Details  Name: [name] MRN: [id number] Date of Birth: 08/01/1961 No Data Recorded  Encounter Date: 10/26/2017  OT End of Session - 10/26/17 1142    Visit Number  21    Number of Visits  29    Date for OT Re-Evaluation  11/25/17    Authorization Type  [employer]    Authorization Time Period  60 visit limit combined, no visits used; $30 copay. Coverage per calendar year.    Authorization - Visit Number  3    Authorization - Number of Visits  25    OT Start Time  [phone] reassessment. Patient arrived late    OT Stop Time  1030    OT Time Calculation (min)  35 min    Activity Tolerance  Patient tolerated treatment well    Behavior During Therapy  WFL for tasks assessed/performed       Past Medical History:  Diagnosis Date  . Abdominal pain of unknown etiology   . Anal fistula    hx of   . Arthritis   . Constipation    severe  . Depression   . GERD (gastroesophageal reflux disease)   . Hiatal hernia   . Hyperthyroidism   . Prediabetes   . S/P colonoscopy April 2011   Dr. Olevia Perches: mild diverticulosis, hyperplastic polyps, repeat in 10 years  . S/P endoscopy April 2011   Dr. Olevia Perches: no hiatal hernia, generous opening to antrum., patent gastrojejunostomy    Past Surgical History:  Procedure Laterality Date  . anal fistulostomy     w/marsupialization an excision of anal tags  . BREAST LUMPECTOMY     benign  . COLONOSCOPY    . ESOPHAGOGASTRODUODENOSCOPY    . GASTRIC BYPASS     mini gastric bypass in [place] reportedly    There were no vitals filed for this visit.  Subjective Assessment - 10/26/17 1114    Subjective   S: I helped my husband move some totes the other day. I made sure to carry them close to my body like you told me. I think I'm just a little sore from that. They  weren't heavy.    Currently in Pain?  Yes    Pain Score  2     Pain Location  Shoulder    Pain Orientation  Left    Pain Descriptors / Indicators  Sore    Pain Type  Acute pain    Pain Radiating Towards  N/A    Pain Onset  Yesterday    Pain Frequency  Intermittent    Aggravating Factors   Helping husband move lightweight totes.    Pain Relieving Factors  rest    Effect of Pain on Daily Activities  min effect    Multiple Pain Sites  No         [facility] OT Assessment - 10/26/17 1009      Assessment   Medical Diagnosis  s/p left shoulder arthroscopy, debridement, SAD, DCR      Precautions   Precautions  Shoulder    Type of Shoulder Precautions  P/ROM and progress as tolerated      ROM / Strength   AROM / PROM / Strength  AROM;PROM;Strength      AROM   Overall AROM Comments  Assessed seated. IR/er adducted. A/ROM not assessed priot to  today.    AROM Assessment Site  Shoulder    Right/Left Shoulder  Left    Left Shoulder Flexion  170 Degrees previous: 151 supine    Left Shoulder ABduction  160 Degrees previous:  138 supine    Left Shoulder Internal Rotation  90 Degrees previous: same supine    Left Shoulder External Rotation  90 Degrees previous: 83 supine      PROM   Overall PROM Comments  Assessed supine, er/IR adducted    PROM Assessment Site  Shoulder    Left Shoulder Flexion  165 Degrees previous: 163    Left Shoulder ABduction  180 Degrees previous: 161    Left Shoulder Internal Rotation  90 Degrees previous: same    Left Shoulder External Rotation  85 Degrees previous: 80      Strength   Overall Strength Comments  Assessed seated. IR/er adducted    Strength Assessment Site  Shoulder    Right/Left Shoulder  Left    Left Shoulder Flexion  4-/5 previous: 4-/5    Left Shoulder ABduction  4-/5 previous: 3+/5    Left Shoulder Internal Rotation  4/5 previous: 4?5    Left Shoulder External Rotation  4-/5 previous: 3+/5               OT Treatments/Exercises  (OP) - 10/26/17 0001      Exercises   Exercises  Shoulder      Shoulder Exercises: Supine   Protraction  PROM;5 reps    Horizontal ABduction  PROM;5 reps    External Rotation  PROM;5 reps    Internal Rotation  PROM;5 reps    Flexion  PROM;5 reps    ABduction  PROM;5 reps      Shoulder Exercises: Seated   Protraction  Strengthening;15 reps    Protraction Weight (lbs)  1    Horizontal ABduction  Strengthening;15 reps    Horizontal ABduction Weight (lbs)  1    External Rotation  Strengthening;15 reps    External Rotation Weight (lbs)  1    Internal Rotation  Strengthening;15 reps    Internal Rotation Weight (lbs)  1    Flexion  Strengthening;15 reps    Flexion Weight (lbs)  1    Abduction  Strengthening;15 reps    ABduction Weight (lbs)  1      Shoulder Exercises: ROM/Strengthening   X to V Arms  10X with 1#    Proximal Shoulder Strengthening, Seated  15X with 1#               OT Short Term Goals - 10/03/17 1237      OT SHORT TERM GOAL #1   Title  Pt will be educated and independent in HEP to improve LUE mobility.     Time  4    Period  Weeks      OT SHORT TERM GOAL #2   Title  Pt will decrease pain in LUE to 5/10 during ADL completion.     Time  4    Period  Weeks      OT SHORT TERM GOAL #3   Title  Pt will improve LUE P/ROM to [facility] to increase ability to perform dressing tasks without compensatory strategies.     Time  4    Period  Weeks      OT SHORT TERM GOAL #4   Title  Pt will improve LUE strength to 3/5 to improve ability to reach for items at waist and shoulder  level.     Time  4    Period  Weeks        OT Long Term Goals - 10/26/17 1141      OT LONG TERM GOAL #1   Title  Pt will return to highest level of functioning and independence in B/IADL completion using LUE as non-dominant.     Time  8    Period  Weeks    Status  On-going      OT LONG TERM GOAL #2   Title  Pt will decrease pain in LUE to 3/10 or less to improve use of LUE as  non-dominant during B/ADL completion.    Time  8    Period  Weeks    Status  Achieved      OT LONG TERM GOAL #3   Title  Pt will decrease fascial restrictions in LUE from moderate to minimal to improve mobility required for functional reaching tasks.     Time  8    Period  Weeks    Status  On-going      OT LONG TERM GOAL #4   Title  Pt will improve LUE A/ROM to [facility] to improve ability to reach overhead and behind back during bathing tasks.     Time  8    Period  Weeks    Status  Achieved      OT LONG TERM GOAL #5   Title  Pt will improve LUE strength to 4+/5 to improve ability to use LUE as non-dominant during work tasks.     Time  8    Period  Weeks    Status  On-going            Plan - 10/26/17 1117    Clinical Impression Statement  A: Reassessment completed this date. patient has met 2/5 long term goals at this time. She is beginning to work on [place] although lack shoulder endurance and stability. ROM active and passively are both [facility]. Recommending 4 more weeks to focus on mentioned deficits.     Plan  P: Complete FOTO. Complate ball on the wall for abduction. Continue with focusing on shoulder strengthening and stability.        Patient will benefit from skilled therapeutic intervention in order to improve the following deficits and impairments:  Decreased activity tolerance, Decreased strength, Impaired flexibility, Decreased range of motion, Pain, Impaired UE functional use, Increased fascial restrictions  Visit Diagnosis: Stiffness of left shoulder, not elsewhere classified - Plan: Ot plan of care cert/re-cert  Other symptoms and signs involving the musculoskeletal system - Plan: Ot plan of care cert/re-cert  Acute pain of left shoulder - Plan: Ot plan of care cert/re-cert    Problem List Patient Active Problem List   Diagnosis Date Noted  . Elevated transaminase level 09/21/2015  . Vitamin D deficiency 09/21/2015  . History of gastric bypass  09/21/2015  . Anemia, iron deficiency 09/21/2015  . Hypothyroidism 05/20/2014  . Rheumatoid arthritis ([place]) 07/11/2013  . Constipation 01/19/2011  . Abdominal discomfort in left lower quadrant 01/19/2011  . GERD 01/01/2010  . HIATAL HERNIA 01/01/2010  . ANAL FISTULA 01/01/2010  . ABDOMINAL PAIN, UNSPECIFIED SITE 01/01/2010   [name], OTR/L,CBIS  [phone]  10/26/2017, 11:53 AM  [facility] [address] [place], [place], [address] Phone: [phone]   Fax:  [phone]  Name: [name] MRN: [id number] Date of Birth: August 10, 1961

## 2017-10-31 ENCOUNTER — Encounter (HOSPITAL_COMMUNITY): Payer: Self-pay | Admitting: Occupational Therapy

## 2017-10-31 ENCOUNTER — Ambulatory Visit (HOSPITAL_COMMUNITY): Payer: 59 | Admitting: Occupational Therapy

## 2017-10-31 DIAGNOSIS — R29898 Other symptoms and signs involving the musculoskeletal system: Secondary | ICD-10-CM

## 2017-10-31 DIAGNOSIS — M25512 Pain in left shoulder: Secondary | ICD-10-CM

## 2017-10-31 DIAGNOSIS — M25612 Stiffness of left shoulder, not elsewhere classified: Secondary | ICD-10-CM

## 2017-10-31 NOTE — Therapy (Signed)
[name], [place], [address] Phone: [phone]   Fax:  [phone]  Occupational Therapy Treatment  Patient Details  Name: [name] MRN: [id number] Date of Birth: [birth year] No Data Recorded  Encounter Date: 10/31/2017  OT End of Session - 10/31/17 1649    Visit Number  22    Number of Visits  29    Date for OT Re-Evaluation  11/25/17    Authorization Type  UHC    Authorization Time Period  60 visit limit combined, no visits used; $30 copay. Coverage per calendar year.    Authorization - Visit Number  4    Authorization - Number of Visits  7    OT Start Time  1761    OT Stop Time  1558    OT Time Calculation (min)  42 min    Activity Tolerance  Patient tolerated treatment well    Behavior During Therapy  WFL for tasks assessed/performed       Past Medical History:  Diagnosis Date  . Abdominal pain of unknown etiology   . Anal fistula    hx of   . Arthritis   . Constipation    severe  . Depression   . GERD (gastroesophageal reflux disease)   . Hiatal hernia   . Hyperthyroidism   . Prediabetes   . S/P colonoscopy April 2011   Dr. Olevia Perches: mild diverticulosis, hyperplastic polyps, repeat in 10 years  . S/P endoscopy April 2011   Dr. Olevia Perches: no hiatal hernia, generous opening to antrum., patent gastrojejunostomy    Past Surgical History:  Procedure Laterality Date  . anal fistulostomy     w/marsupialization an excision of anal tags  . BREAST LUMPECTOMY     benign  . COLONOSCOPY    . ESOPHAGOGASTRODUODENOSCOPY    . GASTRIC BYPASS     mini gastric bypass in [place] reportedly    There were no vitals filed for this visit.  Subjective Assessment - 10/31/17 1518    Subjective   S: I went to the doctor yesterday and he thought I was doing well.     Currently in Pain?  No/denies         [facility] OT Assessment - 10/31/17 1518      Assessment   Medical Diagnosis  s/p left shoulder  arthroscopy, debridement, SAD, DCR      Precautions   Precautions  Shoulder    Type of Shoulder Precautions  P/ROM and progress as tolerated               OT Treatments/Exercises (OP) - 10/31/17 1520      Exercises   Exercises  Shoulder      Shoulder Exercises: Supine   Protraction  PROM;5 reps;Strengthening;10 reps    Protraction Weight (lbs)  2    Horizontal ABduction  PROM;5 reps;Strengthening;10 reps    Horizontal ABduction Weight (lbs)  2    External Rotation  PROM;5 reps;Strengthening;10 reps    External Rotation Weight (lbs)  2    Internal Rotation  PROM;5 reps;Strengthening;10 reps    Internal Rotation Weight (lbs)  2    Flexion  PROM;5 reps;Strengthening;10 reps    Shoulder Flexion Weight (lbs)  2    ABduction  PROM;5 reps;Strengthening;10 reps    Shoulder ABduction Weight (lbs)  2      Shoulder Exercises: Standing   Protraction  Strengthening;10 reps    Protraction Weight (lbs)  2  Horizontal ABduction  Strengthening;10 reps    Horizontal ABduction Weight (lbs)  2    External Rotation  Strengthening;10 reps;Theraband;12 reps    Theraband Level (Shoulder External Rotation)  Level 3 (Green)    External Rotation Weight (lbs)  2    Internal Rotation  Strengthening;10 reps;Theraband;12 reps    Theraband Level (Shoulder Internal Rotation)  Level 3 (Green)    Internal Rotation Weight (lbs)  2    Flexion  Strengthening;10 reps    Shoulder Flexion Weight (lbs)  2    ABduction  Strengthening;10 reps    Shoulder ABduction Weight (lbs)  2    Extension  Theraband;12 reps    Theraband Level (Shoulder Extension)  Level 3 (Green)    Row  Strengthening;12 reps    Theraband Level (Shoulder Row)  Level 3 (Green)    Retraction  Strengthening;12 reps    Theraband Level (Shoulder Retraction)  Level 3 (Green)      Shoulder Exercises: ROM/Strengthening   Over Head Lace  2'    X to V Arms  10X with 1#    Proximal Shoulder Strengthening, Supine  12X each 2# weight, no  rest breaks    Proximal Shoulder Strengthening, Seated  10X with 2#    Ball on Wall  1' flexion green ball      Manual Therapy   Manual Therapy  Myofascial release    Manual therapy comments  Completed separately from therapeutic exercise    Myofascial Release  Myofascial release to left upper arm, trapezius, and scapularis regions to decrease pain and fascial restrictions and increase joint range of motion.                OT Short Term Goals - 10/03/17 1237      OT SHORT TERM GOAL #1   Title  Pt will be educated and independent in HEP to improve LUE mobility.     Time  4    Period  Weeks      OT SHORT TERM GOAL #2   Title  Pt will decrease pain in LUE to 5/10 during ADL completion.     Time  4    Period  Weeks      OT SHORT TERM GOAL #3   Title  Pt will improve LUE P/ROM to [facility] to increase ability to perform dressing tasks without compensatory strategies.     Time  4    Period  Weeks      OT SHORT TERM GOAL #4   Title  Pt will improve LUE strength to 3/5 to improve ability to reach for items at waist and shoulder level.     Time  4    Period  Weeks        OT Long Term Goals - 10/26/17 1141      OT LONG TERM GOAL #1   Title  Pt will return to highest level of functioning and independence in B/IADL completion using LUE as non-dominant.     Time  8    Period  Weeks    Status  On-going      OT LONG TERM GOAL #2   Title  Pt will decrease pain in LUE to 3/10 or less to improve use of LUE as non-dominant during B/ADL completion.    Time  8    Period  Weeks    Status  Achieved      OT LONG TERM GOAL #3   Title  Pt will decrease  fascial restrictions in LUE from moderate to minimal to improve mobility required for functional reaching tasks.     Time  8    Period  Weeks    Status  On-going      OT LONG TERM GOAL #4   Title  Pt will improve LUE A/ROM to [facility] to improve ability to reach overhead and behind back during bathing tasks.     Time  8    Period  Weeks     Status  Achieved      OT LONG TERM GOAL #5   Title  Pt will improve LUE strength to 4+/5 to improve ability to use LUE as non-dominant during work tasks.     Time  8    Period  Weeks    Status  On-going            Plan - 10/31/17 1649    Clinical Impression Statement  A: Pt completing all strengthening with 2# with exception of x to v arms, occasional rest breaks for fatigue. Added er/IR with green theraband, continued with scapular strengthening as well. Occasional verbal cuing for form during exercises.     Plan  P: Complete FOTO. Add ball on wall abduction and w arms.        Patient will benefit from skilled therapeutic intervention in order to improve the following deficits and impairments:  Decreased activity tolerance, Decreased strength, Impaired flexibility, Decreased range of motion, Pain, Impaired UE functional use, Increased fascial restrictions  Visit Diagnosis: Stiffness of left shoulder, not elsewhere classified  Other symptoms and signs involving the musculoskeletal system  Acute pain of left shoulder    Problem List Patient Active Problem List   Diagnosis Date Noted  . Elevated transaminase level 09/21/2015  . Vitamin D deficiency 09/21/2015  . History of gastric bypass 09/21/2015  . Anemia, iron deficiency 09/21/2015  . Hypothyroidism 05/20/2014  . Rheumatoid arthritis ([place]) 07/11/2013  . Constipation 01/19/2011  . Abdominal discomfort in left lower quadrant 01/19/2011  . GERD 01/01/2010  . HIATAL HERNIA 01/01/2010  . ANAL FISTULA 01/01/2010  . ABDOMINAL PAIN, UNSPECIFIED SITE 01/01/2010   Guadelupe Sabin, OTR/L  [phone] 10/31/2017, 4:51 PM  [facility] [address] [place], [place], [address] Phone: [phone]   Fax:  [phone]  Name: [name] MRN: [id number] Date of Birth: 12/10/1960

## 2017-11-01 DIAGNOSIS — M5126 Other intervertebral disc displacement, lumbar region: Secondary | ICD-10-CM | POA: Insufficient documentation

## 2017-11-02 ENCOUNTER — Encounter (HOSPITAL_COMMUNITY): Payer: Self-pay

## 2017-11-02 ENCOUNTER — Ambulatory Visit (HOSPITAL_COMMUNITY): Payer: 59

## 2017-11-02 DIAGNOSIS — R29898 Other symptoms and signs involving the musculoskeletal system: Secondary | ICD-10-CM

## 2017-11-02 DIAGNOSIS — M25612 Stiffness of left shoulder, not elsewhere classified: Secondary | ICD-10-CM

## 2017-11-02 DIAGNOSIS — M25512 Pain in left shoulder: Secondary | ICD-10-CM

## 2017-11-02 NOTE — Therapy (Signed)
[place], [place], [address] Phone: [phone]   Fax:  [phone]  Occupational Therapy Treatment  Patient Details  Name: [name] MRN: [id number] Date of Birth: 09-26-1961 No Data Recorded  Encounter Date: 11/02/2017  OT End of Session - 11/02/17 1241    Visit Number  23    Number of Visits  29    Date for OT Re-Evaluation  11/25/17    Authorization Type  UHC    Authorization Time Period  60 visit limit combined, no visits used; $30 copay. Coverage per calendar year.    Authorization - Visit Number  5    Authorization - Number of Visits  48    OT Start Time  [id number]    OT Stop Time  1030    OT Time Calculation (min)  35 min    Activity Tolerance  Patient tolerated treatment well    Behavior During Therapy  WFL for tasks assessed/performed       Past Medical History:  Diagnosis Date  . Abdominal pain of unknown etiology   . Anal fistula    hx of   . Arthritis   . Constipation    severe  . Depression   . GERD (gastroesophageal reflux disease)   . Hiatal hernia   . Hyperthyroidism   . Prediabetes   . S/P colonoscopy April 2011   Dr. Olevia Perches: mild diverticulosis, hyperplastic polyps, repeat in 10 years  . S/P endoscopy April 2011   Dr. Olevia Perches: no hiatal hernia, generous opening to antrum., patent gastrojejunostomy    Past Surgical History:  Procedure Laterality Date  . anal fistulostomy     w/marsupialization an excision of anal tags  . BREAST LUMPECTOMY     benign  . COLONOSCOPY    . ESOPHAGOGASTRODUODENOSCOPY    . GASTRIC BYPASS     mini gastric bypass in [place] reportedly    There were no vitals filed for this visit.  Subjective Assessment - 11/02/17 1107    Subjective   S: It feels pretty good today.    Currently in Pain?  No/denies         [facility] OT Assessment - 11/02/17 1108      Assessment   Medical Diagnosis  s/p left shoulder arthroscopy, debridement, SAD, DCR       Precautions   Precautions  Shoulder    Type of Shoulder Precautions  P/ROM and progress as tolerated      Observation/Other Assessments   Focus on Therapeutic Outcomes (FOTO)   64/100               OT Treatments/Exercises (OP) - 11/02/17 1012      Exercises   Exercises  Shoulder      Shoulder Exercises: Supine   Protraction  PROM;5 reps;Strengthening;10 reps    Protraction Weight (lbs)  2    Horizontal ABduction  PROM;5 reps;Strengthening;10 reps    Horizontal ABduction Weight (lbs)  2    External Rotation  PROM;5 reps;Strengthening;10 reps    External Rotation Weight (lbs)  2    Internal Rotation  PROM;5 reps;Strengthening;10 reps    Internal Rotation Weight (lbs)  2    Flexion  PROM;5 reps;Strengthening;10 reps    Shoulder Flexion Weight (lbs)  2    ABduction  PROM;5 reps;Strengthening;10 reps    Shoulder ABduction Weight (lbs)  2      Shoulder Exercises: Sidelying   External Rotation  Strengthening;12 reps  External Rotation Weight (lbs)  2    Internal Rotation  Strengthening;12 reps    Internal Rotation Weight (lbs)  2    Flexion  Strengthening;10 reps    Flexion Weight (lbs)  2    ABduction  Strengthening;10 reps    ABduction Weight (lbs)  2    Other Sidelying Exercises  Protraction; 12X; 2#    Other Sidelying Exercises  Horizontal abduction; 10X, 2#      Shoulder Exercises: Standing   Protraction  Strengthening;10 reps    Protraction Weight (lbs)  2    Horizontal ABduction  Strengthening;10 reps    Horizontal ABduction Weight (lbs)  2    External Rotation  Strengthening;10 reps    External Rotation Weight (lbs)  2    Internal Rotation  Strengthening;10 reps    Internal Rotation Weight (lbs)  2    Flexion  Strengthening;10 reps    Shoulder Flexion Weight (lbs)  2    ABduction  Strengthening;10 reps    Shoulder ABduction Weight (lbs)  2      Shoulder Exercises: ROM/Strengthening   Ball on Wall  1' flexion 1' abduction green ball      Manual  Therapy   Manual Therapy  Myofascial release    Manual therapy comments  Completed separately from therapeutic exercise    Myofascial Release  Myofascial release to left upper arm, trapezius, and scapularis regions to decrease pain and fascial restrictions and increase joint range of motion.                OT Short Term Goals - 10/03/17 1237      OT SHORT TERM GOAL #1   Title  Pt will be educated and independent in HEP to improve LUE mobility.     Time  4    Period  Weeks      OT SHORT TERM GOAL #2   Title  Pt will decrease pain in LUE to 5/10 during ADL completion.     Time  4    Period  Weeks      OT SHORT TERM GOAL #3   Title  Pt will improve LUE P/ROM to [facility] to increase ability to perform dressing tasks without compensatory strategies.     Time  4    Period  Weeks      OT SHORT TERM GOAL #4   Title  Pt will improve LUE strength to 3/5 to improve ability to reach for items at waist and shoulder level.     Time  4    Period  Weeks        OT Long Term Goals - 10/26/17 1141      OT LONG TERM GOAL #1   Title  Pt will return to highest level of functioning and independence in B/IADL completion using LUE as non-dominant.     Time  8    Period  Weeks    Status  On-going      OT LONG TERM GOAL #2   Title  Pt will decrease pain in LUE to 3/10 or less to improve use of LUE as non-dominant during B/ADL completion.    Time  8    Period  Weeks    Status  Achieved      OT LONG TERM GOAL #3   Title  Pt will decrease fascial restrictions in LUE from moderate to minimal to improve mobility required for functional reaching tasks.     Time  8  Period  Weeks    Status  On-going      OT LONG TERM GOAL #4   Title  Pt will improve LUE A/ROM to [facility] to improve ability to reach overhead and behind back during bathing tasks.     Time  8    Period  Weeks    Status  Achieved      OT LONG TERM GOAL #5   Title  Pt will improve LUE strength to 4+/5 to improve ability to use  LUE as non-dominant during work tasks.     Time  8    Period  Weeks    Status  On-going            Plan - 11/02/17 1242    Clinical Impression Statement  A: Patient did well with 2# weight during shoulder strengthening. Added sidelying strengthening to increase shoulder stability. VC for form and technique as needed.     Plan  P: Add W arms and attempt prone strengthening.     Consulted and Agree with Plan of Care  Patient       Patient will benefit from skilled therapeutic intervention in order to improve the following deficits and impairments:  Decreased activity tolerance, Decreased strength, Impaired flexibility, Decreased range of motion, Pain, Impaired UE functional use, Increased fascial restrictions  Visit Diagnosis: Stiffness of left shoulder, not elsewhere classified  Other symptoms and signs involving the musculoskeletal system  Acute pain of left shoulder    Problem List Patient Active Problem List   Diagnosis Date Noted  . Elevated transaminase level 09/21/2015  . Vitamin D deficiency 09/21/2015  . History of gastric bypass 09/21/2015  . Anemia, iron deficiency 09/21/2015  . Hypothyroidism 05/20/2014  . Rheumatoid arthritis ([place]) 07/11/2013  . Constipation 01/19/2011  . Abdominal discomfort in left lower quadrant 01/19/2011  . GERD 01/01/2010  . HIATAL HERNIA 01/01/2010  . ANAL FISTULA 01/01/2010  . ABDOMINAL PAIN, UNSPECIFIED SITE 01/01/2010   [name], OTR/L,CBIS  [phone]  11/02/2017, 12:44 PM  [facility] [address] [place], [place], [address] Phone: [phone]   Fax:  [phone]  Name: [name] MRN: [id number] Date of Birth: 14-Mar-1961

## 2017-11-06 ENCOUNTER — Ambulatory Visit: Payer: 59 | Admitting: Podiatry

## 2017-11-07 ENCOUNTER — Encounter (HOSPITAL_COMMUNITY): Payer: Self-pay

## 2017-11-07 ENCOUNTER — Ambulatory Visit (HOSPITAL_COMMUNITY): Payer: 59

## 2017-11-07 DIAGNOSIS — M25612 Stiffness of left shoulder, not elsewhere classified: Secondary | ICD-10-CM

## 2017-11-07 DIAGNOSIS — R29898 Other symptoms and signs involving the musculoskeletal system: Secondary | ICD-10-CM

## 2017-11-07 DIAGNOSIS — M25512 Pain in left shoulder: Secondary | ICD-10-CM

## 2017-11-07 NOTE — Therapy (Signed)
[place], [place], [address] Phone: [phone]   Fax:  [phone]  Occupational Therapy Treatment  Patient Details  Name: [name] MRN: [id number] Date of Birth: [birth year] No Data Recorded  Encounter Date: 11/07/2017  OT End of Session - 11/07/17 1052    Visit Number  24    Number of Visits  29    Date for OT Re-Evaluation  11/25/17    Authorization Type  UHC    Authorization Time Period  60 visit limit combined, no visits used; $30 copay. Coverage per calendar year.    Authorization - Visit Number  6    Authorization - Number of Visits  39    OT Start Time  7035 Pt arrived late    OT Stop Time  1030    OT Time Calculation (min)  35 min    Activity Tolerance  Patient tolerated treatment well    Behavior During Therapy  WFL for tasks assessed/performed       Past Medical History:  Diagnosis Date  . Abdominal pain of unknown etiology   . Anal fistula    hx of   . Arthritis   . Constipation    severe  . Depression   . GERD (gastroesophageal reflux disease)   . Hiatal hernia   . Hyperthyroidism   . Prediabetes   . S/P colonoscopy April 2011   Dr. Olevia Perches: mild diverticulosis, hyperplastic polyps, repeat in 10 years  . S/P endoscopy April 2011   Dr. Olevia Perches: no hiatal hernia, generous opening to antrum., patent gastrojejunostomy    Past Surgical History:  Procedure Laterality Date  . anal fistulostomy     w/marsupialization an excision of anal tags  . BREAST LUMPECTOMY     benign  . COLONOSCOPY    . ESOPHAGOGASTRODUODENOSCOPY    . GASTRIC BYPASS     mini gastric bypass in [place] reportedly    There were no vitals filed for this visit.  Subjective Assessment - 11/07/17 1012    Subjective   S: I've started sleeping on it again and I think it's making it sore.     Currently in Pain?  No/denies         [facility] OT Assessment - 11/07/17 1013      Assessment   Medical Diagnosis   s/p left shoulder arthroscopy, debridement, SAD, DCR      Precautions   Precautions  Shoulder    Type of Shoulder Precautions  Progress as tolerated               OT Treatments/Exercises (OP) - 11/07/17 1013      Exercises   Exercises  Shoulder      Shoulder Exercises: Supine   Protraction  PROM;5 reps;Strengthening;12 reps    Protraction Weight (lbs)  2    Horizontal ABduction  PROM;5 reps;Strengthening;12 reps    Horizontal ABduction Weight (lbs)  2    External Rotation  PROM;5 reps;Strengthening;12 reps    External Rotation Weight (lbs)  2    Internal Rotation  PROM;5 reps;Strengthening;12 reps    Internal Rotation Weight (lbs)  2    Flexion  PROM;5 reps;Strengthening;12 reps    Shoulder Flexion Weight (lbs)  2    ABduction  PROM;5 reps;Strengthening;12 reps    Shoulder ABduction Weight (lbs)  2      Shoulder Exercises: Sidelying   External Rotation  Strengthening;12 reps    External Rotation Weight (lbs)  2    Internal Rotation  Strengthening;12 reps    Internal Rotation Weight (lbs)  2    Flexion  Strengthening;12 reps    Flexion Weight (lbs)  2    ABduction  Strengthening;12 reps    ABduction Weight (lbs)  2    Other Sidelying Exercises  Protraction; 12X; 2#    Other Sidelying Exercises  Horizontal abduction; 12X, 2#      Shoulder Exercises: ROM/Strengthening   Cybex Press  2 plate;10 reps    Cybex Row  2 plate;10 reps      Manual Therapy   Manual Therapy  Joint mobilization    Manual therapy comments  Completed separately from therapeutic exercise    Joint Mobilization  Joint mobilization completed to left upper arm to increase ROM and mobilize soft tissue and joint               OT Short Term Goals - 10/03/17 1237      OT SHORT TERM GOAL #1   Title  Pt will be educated and independent in HEP to improve LUE mobility.     Time  4    Period  Weeks      OT SHORT TERM GOAL #2   Title  Pt will decrease pain in LUE to 5/10 during ADL  completion.     Time  4    Period  Weeks      OT SHORT TERM GOAL #3   Title  Pt will improve LUE P/ROM to [facility] to increase ability to perform dressing tasks without compensatory strategies.     Time  4    Period  Weeks      OT SHORT TERM GOAL #4   Title  Pt will improve LUE strength to 3/5 to improve ability to reach for items at waist and shoulder level.     Time  4    Period  Weeks        OT Long Term Goals - 11/07/17 1054      OT LONG TERM GOAL #1   Title  Pt will return to highest level of functioning and independence in B/IADL completion using LUE as non-dominant.     Time  8    Period  Weeks    Status  On-going      OT LONG TERM GOAL #2   Title  Pt will decrease pain in LUE to 3/10 or less to improve use of LUE as non-dominant during B/ADL completion.    Time  8    Period  Weeks      OT LONG TERM GOAL #3   Title  Pt will decrease fascial restrictions in LUE from moderate to minimal to improve mobility required for functional reaching tasks.     Time  8    Period  Weeks    Status  On-going      OT LONG TERM GOAL #4   Title  Pt will improve LUE A/ROM to [facility] to improve ability to reach overhead and behind back during bathing tasks.     Time  8    Period  Weeks      OT LONG TERM GOAL #5   Title  Pt will improve LUE strength to 4+/5 to improve ability to use LUE as non-dominant during work tasks.     Time  8    Period  Weeks    Status  On-going  Plan - 11/07/17 1052    Clinical Impression Statement  A: Session focused on joint mobilization of left shoulder to increase joint mobility. Pt was able to achieve further shoulder flexion and abduction with mobilization techniques. Added cybex row and press for shoulder strenghtening. VC for form and technique. Pt cancelled Thursday's appointment as she will be having surgery on her back.    Plan  P: Attempt prone strengthening and add horizontal abduction with serratus plus using red band.    Consulted and  Agree with Plan of Care  Patient       Patient will benefit from skilled therapeutic intervention in order to improve the following deficits and impairments:  Decreased activity tolerance, Decreased strength, Impaired flexibility, Decreased range of motion, Pain, Impaired UE functional use, Increased fascial restrictions  Visit Diagnosis: Stiffness of left shoulder, not elsewhere classified  Other symptoms and signs involving the musculoskeletal system  Acute pain of left shoulder    Problem List Patient Active Problem List   Diagnosis Date Noted  . Elevated transaminase level 09/21/2015  . Vitamin D deficiency 09/21/2015  . History of gastric bypass 09/21/2015  . Anemia, iron deficiency 09/21/2015  . Hypothyroidism 05/20/2014  . Rheumatoid arthritis ([place]) 07/11/2013  . Constipation 01/19/2011  . Abdominal discomfort in left lower quadrant 01/19/2011  . GERD 01/01/2010  . HIATAL HERNIA 01/01/2010  . ANAL FISTULA 01/01/2010  . ABDOMINAL PAIN, UNSPECIFIED SITE 01/01/2010   [name], OTR/L,CBIS  [phone]  11/07/2017, 10:55 AM  [facility] [address] [place], [place], [address] Phone: [phone]   Fax:  [phone]  Name: NYESHIA MYSLIWIEC MRN: [id number] Date of Birth: 09-26-1961

## 2017-11-09 ENCOUNTER — Ambulatory Visit (HOSPITAL_COMMUNITY): Payer: 59

## 2017-11-09 DIAGNOSIS — M5127 Other intervertebral disc displacement, lumbosacral region: Secondary | ICD-10-CM | POA: Diagnosis not present

## 2017-11-13 ENCOUNTER — Telehealth (HOSPITAL_COMMUNITY): Payer: Self-pay | Admitting: Family Medicine

## 2017-11-13 NOTE — Telephone Encounter (Signed)
11/13/17  pt cx because of her surgery - she can't drive for 2 weeks

## 2017-11-14 ENCOUNTER — Ambulatory Visit (HOSPITAL_COMMUNITY): Payer: 59

## 2017-11-15 DIAGNOSIS — Z01419 Encounter for gynecological examination (general) (routine) without abnormal findings: Secondary | ICD-10-CM | POA: Diagnosis not present

## 2017-11-15 DIAGNOSIS — Z1231 Encounter for screening mammogram for malignant neoplasm of breast: Secondary | ICD-10-CM | POA: Diagnosis not present

## 2017-11-16 ENCOUNTER — Encounter (HOSPITAL_COMMUNITY): Payer: 59

## 2017-11-20 ENCOUNTER — Telehealth (HOSPITAL_COMMUNITY): Payer: Self-pay | Admitting: Family Medicine

## 2017-11-20 NOTE — Telephone Encounter (Signed)
11/20/17  I left a message to let patient know I had cx this weeks appts and she had no more on the schedule

## 2017-11-20 NOTE — Telephone Encounter (Signed)
11/20/17  pt left a message to cx these because she still can't drive

## 2017-11-21 ENCOUNTER — Ambulatory Visit (HOSPITAL_COMMUNITY): Payer: 59

## 2017-11-23 ENCOUNTER — Encounter (HOSPITAL_COMMUNITY): Payer: 59

## 2017-11-27 ENCOUNTER — Ambulatory Visit: Payer: 59 | Admitting: Podiatry

## 2017-11-30 DIAGNOSIS — M7542 Impingement syndrome of left shoulder: Secondary | ICD-10-CM | POA: Diagnosis not present

## 2018-01-01 ENCOUNTER — Other Ambulatory Visit: Payer: Self-pay | Admitting: Family Medicine

## 2018-01-03 ENCOUNTER — Ambulatory Visit: Payer: 59 | Admitting: Family Medicine

## 2018-01-03 DIAGNOSIS — M859 Disorder of bone density and structure, unspecified: Secondary | ICD-10-CM | POA: Diagnosis not present

## 2018-01-03 DIAGNOSIS — Z79899 Other long term (current) drug therapy: Secondary | ICD-10-CM | POA: Diagnosis not present

## 2018-01-03 DIAGNOSIS — M7582 Other shoulder lesions, left shoulder: Secondary | ICD-10-CM | POA: Diagnosis not present

## 2018-01-03 DIAGNOSIS — M0579 Rheumatoid arthritis with rheumatoid factor of multiple sites without organ or systems involvement: Secondary | ICD-10-CM | POA: Diagnosis not present

## 2018-01-03 DIAGNOSIS — R5383 Other fatigue: Secondary | ICD-10-CM | POA: Diagnosis not present

## 2018-01-03 DIAGNOSIS — M8580 Other specified disorders of bone density and structure, unspecified site: Secondary | ICD-10-CM | POA: Diagnosis not present

## 2018-01-09 ENCOUNTER — Encounter: Payer: Self-pay | Admitting: Neurology

## 2018-01-09 ENCOUNTER — Ambulatory Visit: Payer: 59 | Admitting: Neurology

## 2018-01-09 ENCOUNTER — Ambulatory Visit (INDEPENDENT_AMBULATORY_CARE_PROVIDER_SITE_OTHER): Payer: 59 | Admitting: Neurology

## 2018-01-09 DIAGNOSIS — G5601 Carpal tunnel syndrome, right upper limb: Secondary | ICD-10-CM

## 2018-01-09 DIAGNOSIS — G5621 Lesion of ulnar nerve, right upper limb: Secondary | ICD-10-CM

## 2018-01-09 HISTORY — DX: Lesion of ulnar nerve, right upper limb: G56.21

## 2018-01-09 HISTORY — DX: Carpal tunnel syndrome, right upper limb: G56.01

## 2018-01-09 NOTE — Progress Notes (Signed)
    [place]    Nerve / Sites Muscle Latency Ref. Amplitude Ref. Rel Amp Segments Distance Velocity Ref. Area    ms ms mV mV %  cm m/s m/s mVms  R Median - APB     Wrist APB 6.5 ?4.4 2.1 ?4.0 100 Wrist - APB 7   9.0     Upper arm APB 10.2  2.0  95.2 Upper arm - Wrist 20 55 ?49 8.9  L Median - APB     Wrist APB 3.4 ?4.4 7.1 ?4.0 100 Wrist - APB 7   26.3     Upper arm APB 7.0  7.3  103 Upper arm - Wrist 20 56 ?49 27.8  R Ulnar - ADM     Wrist ADM 2.8 ?3.3 4.3 ?6.0 100 Wrist - ADM 7   17.3     B.Elbow ADM 5.9  2.9  67.5 B.Elbow - Wrist 18 58 ?49 16.1     A.Elbow ADM 8.0  2.1  71.8 A.Elbow - B.Elbow 10 47 ?49 12.4         A.Elbow - Wrist      L Ulnar - ADM     Wrist ADM 2.3 ?3.3 9.2 ?6.0 100 Wrist - ADM 7   30.7     B.Elbow ADM 5.4  9.4  102 B.Elbow - Wrist 18 59 ?49 28.6     A.Elbow ADM 7.2  9.7  104 A.Elbow - B.Elbow 10 55 ?49 30.0         A.Elbow - Wrist                 [facility]    Nerve / Sites Rec. Site Peak Lat Ref.  Amp Ref. Segments Distance    ms ms V V  cm  R Median - Orthodromic (Dig II, Mid palm)     Dig II Wrist 4.9 ?3.4 2 ?10 Dig II - Wrist 13  L Median - Orthodromic (Dig II, Mid palm)     Dig II Wrist 3.0 ?3.4 12 ?10 Dig II - Wrist 13  R Ulnar - Orthodromic, (Dig V, Mid palm)     Dig V Wrist 2.7 ?3.1 8 ?5 Dig V - Wrist 11  L Ulnar - Orthodromic, (Dig V, Mid palm)     Dig V Wrist 2.9 ?3.1 6 ?5 Dig V - Wrist 56              F  Wave    Nerve F Lat Ref.   ms ms  R Ulnar - ADM 27.0 ?32.0  L Ulnar - ADM 25.7 ?32.0         EMG full

## 2018-01-09 NOTE — Procedures (Signed)
     HISTORY:  [name] is a 57 year old patient with a history of rheumatoid arthritis who comes in for an evaluation of a 2-week history of numbness of the right hand.  The patient denies any pain in the neck or shoulder or pain down the arm.  She does believe there is some weakness of the right hand.  NERVE CONDUCTION STUDIES:  Nerve conduction studies were performed on both upper extremities.  The distal motor latencies for the median nerves were prolonged on the right, normal on the left, with a low motor amplitude on the right, normal on the left.  The distal motor latencies for the ulnar nerves were normal bilaterally with a low motor amplitude on the right, normal on the left.  The nerve conduction velocities for the median nerves were normal bilaterally.  The nerve conduction velocities for the ulnar nerves were normal on the left, and slowed on the right above the elbow, normal below the elbow.  The sensory latencies for the median nerves were prolonged on the right, normal on the left, and normal for the ulnar nerves bilaterally.  The F-wave latencies for the ulnar nerves were normal bilaterally.  EMG STUDIES:  EMG study was performed on the right upper extremity:  The first dorsal interosseous muscle reveals 2 to 4 K units with decreased recruitment. No fibrillations or positive waves were noted. The abductor pollicis brevis muscle reveals 2 to 4 K units with decreased recruitment. No fibrillations or positive waves were noted. The extensor indicis proprius muscle reveals 1 to 3 K units with full recruitment. No fibrillations or positive waves were noted. The pronator teres muscle reveals 2 to 3 K units with full recruitment. No fibrillations or positive waves were noted. The flexor digitorum profundus muscle (III-IV) reveals 2 to 3 K units with full recruitment.  No fibrillations or positive waves were seen. The biceps muscle reveals 1 to 2 K units with full recruitment. No  fibrillations or positive waves were noted. The triceps muscle reveals 2 to 4 K units with full recruitment. No fibrillations or positive waves were noted. The anterior deltoid muscle reveals 2 to 3 K units with full recruitment. No fibrillations or positive waves were noted. The cervical paraspinal muscles were tested at 2 levels. No abnormalities of insertional activity were seen at either level tested. There was good relaxation.   IMPRESSION:  Nerve conduction studies done on both upper extremities shows evidence of a right carpal tunnel syndrome of moderate severity.  There is also evidence of an ulnar neuropathy on the right at the elbow.  Both of these neuropathies appear to be chronic in nature.  There is no evidence of an overlying cervical radiculopathy.  Jill Alexanders MD 01/09/2018 9:35 AM  [facility] [address] [place], [place] [address]  Phone [phone] Fax [phone]

## 2018-01-09 NOTE — Progress Notes (Signed)
Please refer to EMG and nerve conduction study procedure note. 

## 2018-01-10 NOTE — Progress Notes (Signed)
Nurses-please make sure the patient is aware of her nerve conduction study it shows moderate right carpal tunnel syndrome as well as ulnar nerve entrapment I would recommend referral to orthopedics if she is not already seeing one for this issue.  Please let me know.  Thank you

## 2018-01-11 ENCOUNTER — Other Ambulatory Visit: Payer: Self-pay

## 2018-01-11 ENCOUNTER — Ambulatory Visit (HOSPITAL_COMMUNITY): Payer: 59 | Attending: Orthopedic Surgery

## 2018-01-11 ENCOUNTER — Encounter (HOSPITAL_COMMUNITY): Payer: Self-pay

## 2018-01-11 DIAGNOSIS — M25612 Stiffness of left shoulder, not elsewhere classified: Secondary | ICD-10-CM

## 2018-01-11 DIAGNOSIS — M25512 Pain in left shoulder: Secondary | ICD-10-CM | POA: Diagnosis not present

## 2018-01-11 DIAGNOSIS — G5601 Carpal tunnel syndrome, right upper limb: Secondary | ICD-10-CM | POA: Diagnosis not present

## 2018-01-11 DIAGNOSIS — R29898 Other symptoms and signs involving the musculoskeletal system: Secondary | ICD-10-CM | POA: Diagnosis not present

## 2018-01-11 NOTE — Progress Notes (Signed)
I called and spoke with the pt she states she has an orthopedic Doc and will notify them and follow up with them.

## 2018-01-11 NOTE — Patient Instructions (Signed)
Strengthening: Chest Pull - Resisted   Hold Theraband in front of body with hands about shoulder width a part. Pull band a part and back together slowly. Repeat __12-15__ times. Complete ____ set(s) per session.. Repeat ___1_ session(s) per day.  http://orth.exer.us/926   Copyright  VHI. All rights reserved.   PNF Strengthening: Resisted   Standing with resistive band around each hand, bring right arm up and away, thumb back. Repeat _12-15___ times per set. Do ____ sets per session. Do __1__ sessions per day.       Resisted External Rotation: in Neutral - Bilateral   Sit or stand, tubing in both hands, elbows at sides, bent to 90, forearms forward. Pinch shoulder blades together and rotate forearms out. Keep elbows at sides. Repeat __12=15__ times per set. Do ____ sets per session. Do ___1_ sessions per day.  http://orth.exer.us/966   Copyright  VHI. All rights reserved.   PNF Strengthening: Resisted   Standing, hold resistive band above head. Bring right arm down and out from side. Repeat __12-15__ times per set. Do ____ sets per session. Do __1__ sessions per day.  http://orth.exer.us/922   Copyright  VHI. All rights reserved.   (Home) Extension: Isometric / Bilateral Arm Retraction - Sitting   Facing anchor, hold hands and elbow at shoulder height, with elbow bent.  Pull arms back to squeeze shoulder blades together. Repeat 10-15 times. 1-3 times/day.   (Clinic) Extension / Flexion (Assist)   Face anchor, pull arms back, keeping elbow straight, and squeze shoulder blades together. Repeat 10-15 times. 1-3 times/day.   Copyright  VHI. All rights reserved.   (Home) Retraction: Row - Bilateral (Anchor)   Facing anchor, arms reaching forward, pull hands toward stomach, keeping elbows bent and at your sides and pinching shoulder blades together. Repeat 10-15 times. 1-3 times/day.   Copyright  VHI. All rights reserved.

## 2018-01-11 NOTE — Therapy (Signed)
[place], [place], [address] Phone: [phone]   Fax:  [phone]  Occupational Therapy Treatment  Patient Details  Name: [name] MRN: [id number] Date of Birth: 10/14/1961 No data recorded  Encounter Date: 01/11/2018  OT End of Session - 01/11/18 1424    Visit Number  25    Number of Visits  29    Authorization Type  [employer]    Authorization Time Period  60 visit limit combined, no visits used; $30 copay. Coverage per calendar year.    Authorization - Visit Number  7    Authorization - Number of Visits  60    OT Start Time  1300    OT Stop Time  1335    OT Time Calculation (min)  35 min    Activity Tolerance  Patient tolerated treatment well    Behavior During Therapy  WFL for tasks assessed/performed       Past Medical History:  Diagnosis Date  . Abdominal pain of unknown etiology   . Anal fistula    hx of   . Arthritis   . Constipation    severe  . Depression   . GERD (gastroesophageal reflux disease)   . Hiatal hernia   . Hyperthyroidism   . Prediabetes   . Right carpal tunnel syndrome 01/09/2018  . S/P colonoscopy April 2011   Dr. Olevia Perches: mild diverticulosis, hyperplastic polyps, repeat in 10 years  . S/P endoscopy April 2011   Dr. Olevia Perches: no hiatal hernia, generous opening to antrum., patent gastrojejunostomy  . Ulnar neuropathy at elbow, right 01/09/2018    Past Surgical History:  Procedure Laterality Date  . anal fistulostomy     w/marsupialization an excision of anal tags  . BREAST LUMPECTOMY     benign  . COLONOSCOPY    . ESOPHAGOGASTRODUODENOSCOPY    . GASTRIC BYPASS     mini gastric bypass in [place] reportedly    There were no vitals filed for this visit.  Subjective Assessment - 01/11/18 1422    Subjective   S: I can tell it's a little stiff.     Currently in Pain?  Yes         [facility] OT Assessment - 01/11/18 1423      Assessment   Medical Diagnosis  s/p left  shoulder arthroscopy, debridement, SAD, DCR      Precautions   Precautions  Shoulder    Type of Shoulder Precautions  Progress as tolerated      Observation/Other Assessments   Focus on Therapeutic Outcomes (FOTO)   75/100      AROM   Overall AROM   Within functional limits for tasks performed    AROM Assessment Site  Shoulder    Right/Left Shoulder  Left      Strength   Overall Strength  Within functional limits for tasks performed    Strength Assessment Site  Shoulder    Right/Left Shoulder  Left               OT Treatments/Exercises (OP) - 01/11/18 1423      Exercises   Exercises  Shoulder      Shoulder Exercises: Seated   Horizontal ABduction  Theraband;10 reps    Theraband Level (Shoulder Horizontal ABduction)  Level 3 (Green)    External Rotation  Theraband;10 reps    Theraband Level (Shoulder External Rotation)  Level 3 (Green)    Internal Rotation  Theraband;10 reps  Theraband Level (Shoulder Internal Rotation)  Level 3 (Green)    Abduction  Theraband;10 reps    Theraband Level (Shoulder ABduction)  Level 3 (Green)      Shoulder Exercises: Standing   Extension  Theraband;10 reps    Theraband Level (Shoulder Extension)  Level 3 (Green)    Row  Theraband;10 reps    Theraband Level (Shoulder Row)  Level 3 (Green)    Retraction  Theraband;10 reps    Theraband Level (Shoulder Retraction)  Level 3 Nyoka Cowden)             OT Education - 01/11/18 1425    Education provided  Yes    Education Details  shoulder strengthening with green theraband. general education provided regarding continuing HEP at home. expectations for recovery.    Person(s) Educated  Patient    Methods  Explanation;Handout;Demonstration    Comprehension  Verbalized understanding;Returned demonstration       OT Short Term Goals - 10/03/17 1237      OT SHORT TERM GOAL #1   Title  Pt will be educated and independent in HEP to improve LUE mobility.     Time  4    Period  Weeks       OT SHORT TERM GOAL #2   Title  Pt will decrease pain in LUE to 5/10 during ADL completion.     Time  4    Period  Weeks      OT SHORT TERM GOAL #3   Title  Pt will improve LUE P/ROM to [facility] to increase ability to perform dressing tasks without compensatory strategies.     Time  4    Period  Weeks      OT SHORT TERM GOAL #4   Title  Pt will improve LUE strength to 3/5 to improve ability to reach for items at waist and shoulder level.     Time  4    Period  Weeks        OT Long Term Goals - 01/11/18 1428      OT LONG TERM GOAL #1   Title  Pt will return to highest level of functioning and independence in B/IADL completion using LUE as non-dominant.     Time  8    Period  Weeks    Status  Achieved      OT LONG TERM GOAL #2   Title  Pt will decrease pain in LUE to 3/10 or less to improve use of LUE as non-dominant during B/ADL completion.    Time  8    Period  Weeks      OT LONG TERM GOAL #3   Title  Pt will decrease fascial restrictions in LUE from moderate to minimal to improve mobility required for functional reaching tasks.     Time  8    Period  Weeks    Status  Achieved      OT LONG TERM GOAL #4   Title  Pt will improve LUE A/ROM to [facility] to improve ability to reach overhead and behind back during bathing tasks.     Time  8    Period  Weeks      OT LONG TERM GOAL #5   Title  Pt will improve LUE strength to 4+/5 to improve ability to use LUE as non-dominant during work tasks.     Time  8    Period  Weeks    Status  Achieved  Plan - 01/11/18 1426    Clinical Impression Statement  A: Session was focused on quickly assessing patient's ROM and strength as she has not been to clinic since her back surgery in January 2019. Updated HEP to focus on strength. All education was completed regarding frequency and duration. All questions answered.     Plan  P: D/C from therapy with HEP.    Consulted and Agree with Plan of Care  Patient       Patient will  benefit from skilled therapeutic intervention in order to improve the following deficits and impairments:  Decreased activity tolerance, Decreased strength, Impaired flexibility, Decreased range of motion, Pain, Impaired UE functional use, Increased fascial restrictions  Visit Diagnosis: Stiffness of left shoulder, not elsewhere classified  Other symptoms and signs involving the musculoskeletal system  Acute pain of left shoulder    Problem List Patient Active Problem List   Diagnosis Date Noted  . Right carpal tunnel syndrome 01/09/2018  . Ulnar neuropathy at elbow, right 01/09/2018  . Elevated transaminase level 09/21/2015  . Vitamin D deficiency 09/21/2015  . History of gastric bypass 09/21/2015  . Anemia, iron deficiency 09/21/2015  . Hypothyroidism 05/20/2014  . Rheumatoid arthritis ([place]) 07/11/2013  . Constipation 01/19/2011  . Abdominal discomfort in left lower quadrant 01/19/2011  . GERD 01/01/2010  . HIATAL HERNIA 01/01/2010  . ANAL FISTULA 01/01/2010  . ABDOMINAL PAIN, UNSPECIFIED SITE 01/01/2010   OCCUPATIONAL THERAPY DISCHARGE SUMMARY  Visits from Start of Care: 25  Current functional level related to goals / functional outcomes: Pt is using her right UE as her dominant extremity without difficulty. ROM and strength is functional. See goals above. All are met.    Remaining deficits: Some soreness in the anterior shoulder during internal rotation.    Education / Equipment: See above Plan: Patient agrees to discharge.  Patient goals were met. Patient is being discharged due to meeting the stated rehab goals.  ?????        [name], OTR/L,CBIS  [phone]  01/11/2018, 3:36 PM  [facility] [address] [place], [place], [address] Phone: [phone]   Fax:  [phone]  Name: LATOYIA TECSON MRN: [id number] Date of Birth: [birth year]

## 2018-01-15 ENCOUNTER — Other Ambulatory Visit (HOSPITAL_COMMUNITY): Payer: Self-pay | Admitting: Family Medicine

## 2018-01-17 DIAGNOSIS — G5621 Lesion of ulnar nerve, right upper limb: Secondary | ICD-10-CM | POA: Diagnosis not present

## 2018-01-17 DIAGNOSIS — G5601 Carpal tunnel syndrome, right upper limb: Secondary | ICD-10-CM | POA: Diagnosis not present

## 2018-01-18 ENCOUNTER — Other Ambulatory Visit: Payer: Self-pay | Admitting: Orthopedic Surgery

## 2018-01-24 ENCOUNTER — Encounter (HOSPITAL_BASED_OUTPATIENT_CLINIC_OR_DEPARTMENT_OTHER): Payer: Self-pay

## 2018-01-25 ENCOUNTER — Other Ambulatory Visit: Payer: Self-pay

## 2018-01-25 ENCOUNTER — Encounter (HOSPITAL_BASED_OUTPATIENT_CLINIC_OR_DEPARTMENT_OTHER): Payer: Self-pay

## 2018-01-25 NOTE — Progress Notes (Signed)
Spoke with:  Ivin Booty NPO: No food after midnight/Clear liquids until 6:00AM DOS Arrival time:  1000AM Labs: None AM medications:  Duloxetine, Levothyroxine Pre op orders:  yes Ride home:  Sherren Mocha (husband) [phone]

## 2018-02-01 DIAGNOSIS — Z79899 Other long term (current) drug therapy: Secondary | ICD-10-CM | POA: Diagnosis not present

## 2018-02-01 DIAGNOSIS — M0579 Rheumatoid arthritis with rheumatoid factor of multiple sites without organ or systems involvement: Secondary | ICD-10-CM | POA: Diagnosis not present

## 2018-02-01 DIAGNOSIS — M7582 Other shoulder lesions, left shoulder: Secondary | ICD-10-CM | POA: Diagnosis not present

## 2018-02-06 ENCOUNTER — Ambulatory Visit (HOSPITAL_BASED_OUTPATIENT_CLINIC_OR_DEPARTMENT_OTHER): Payer: 59 | Admitting: Anesthesiology

## 2018-02-06 ENCOUNTER — Ambulatory Visit (HOSPITAL_BASED_OUTPATIENT_CLINIC_OR_DEPARTMENT_OTHER)
Admission: RE | Admit: 2018-02-06 | Discharge: 2018-02-06 | Disposition: A | Discharge status: Home / Self Care | Payer: 59 | Source: Ambulatory Visit | Attending: Orthopedic Surgery | Admitting: Orthopedic Surgery

## 2018-02-06 ENCOUNTER — Encounter (HOSPITAL_BASED_OUTPATIENT_CLINIC_OR_DEPARTMENT_OTHER): Payer: Self-pay

## 2018-02-06 ENCOUNTER — Encounter (HOSPITAL_BASED_OUTPATIENT_CLINIC_OR_DEPARTMENT_OTHER)
Admission: RE | Disposition: A | Discharge status: Home / Self Care | Payer: Self-pay | Source: Ambulatory Visit | Attending: Orthopedic Surgery

## 2018-02-06 DIAGNOSIS — Z880 Allergy status to penicillin: Secondary | ICD-10-CM | POA: Diagnosis not present

## 2018-02-06 DIAGNOSIS — Z79899 Other long term (current) drug therapy: Secondary | ICD-10-CM | POA: Insufficient documentation

## 2018-02-06 DIAGNOSIS — Z87891 Personal history of nicotine dependence: Secondary | ICD-10-CM | POA: Diagnosis not present

## 2018-02-06 DIAGNOSIS — Z79890 Hormone replacement therapy (postmenopausal): Secondary | ICD-10-CM | POA: Diagnosis not present

## 2018-02-06 DIAGNOSIS — G5622 Lesion of ulnar nerve, left upper limb: Secondary | ICD-10-CM | POA: Diagnosis not present

## 2018-02-06 DIAGNOSIS — E039 Hypothyroidism, unspecified: Secondary | ICD-10-CM | POA: Insufficient documentation

## 2018-02-06 DIAGNOSIS — M05831 Other rheumatoid arthritis with rheumatoid factor of right wrist: Secondary | ICD-10-CM | POA: Diagnosis not present

## 2018-02-06 DIAGNOSIS — G5601 Carpal tunnel syndrome, right upper limb: Secondary | ICD-10-CM | POA: Diagnosis not present

## 2018-02-06 DIAGNOSIS — G5621 Lesion of ulnar nerve, right upper limb: Secondary | ICD-10-CM | POA: Insufficient documentation

## 2018-02-06 DIAGNOSIS — M069 Rheumatoid arthritis, unspecified: Secondary | ICD-10-CM | POA: Insufficient documentation

## 2018-02-06 DIAGNOSIS — G8918 Other acute postprocedural pain: Secondary | ICD-10-CM | POA: Diagnosis not present

## 2018-02-06 HISTORY — DX: Plantar fascial fibromatosis: M72.2

## 2018-02-06 HISTORY — DX: Hypothyroidism, unspecified: E03.9

## 2018-02-06 HISTORY — DX: Olecranon bursitis, unspecified elbow: M70.20

## 2018-02-06 HISTORY — DX: Other intervertebral disc degeneration, lumbar region: M51.36

## 2018-02-06 HISTORY — DX: Iron deficiency anemia, unspecified: D50.9

## 2018-02-06 HISTORY — DX: Vitamin D deficiency, unspecified: E55.9

## 2018-02-06 HISTORY — DX: Sciatica, unspecified side: M54.30

## 2018-02-06 HISTORY — DX: Pain in left shoulder: M25.512

## 2018-02-06 HISTORY — DX: Cervicalgia: M54.2

## 2018-02-06 HISTORY — DX: Personal history of other diseases of the digestive system: Z87.19

## 2018-02-06 HISTORY — DX: Diarrhea, unspecified: R19.7

## 2018-02-06 HISTORY — DX: Anesthesia of skin: R20.0

## 2018-02-06 HISTORY — DX: Rheumatoid arthritis, unspecified: M06.9

## 2018-02-06 HISTORY — PX: CARPAL TUNNEL WITH CUBITAL TUNNEL: SHX5608

## 2018-02-06 HISTORY — DX: Other symptoms and signs involving the musculoskeletal system: R29.898

## 2018-02-06 HISTORY — DX: Other intervertebral disc degeneration, lumbar region without mention of lumbar back pain or lower extremity pain: M51.369

## 2018-02-06 HISTORY — DX: Low back pain: M54.5

## 2018-02-06 HISTORY — PX: ULNAR NERVE TRANSPOSITION: SHX2595

## 2018-02-06 HISTORY — DX: Other intervertebral disc displacement, lumbar region: M51.26

## 2018-02-06 HISTORY — DX: Low back pain, unspecified: M54.50

## 2018-02-06 HISTORY — PX: ULNAR TUNNEL RELEASE: SHX820

## 2018-02-06 HISTORY — DX: Atherosclerosis of aorta: I70.0

## 2018-02-06 SURGERY — RELEASE, CARPAL TUNNEL AND CUBITAL TUNNEL
Anesthesia: General | Site: Wrist | Laterality: Right

## 2018-02-06 MED ORDER — MENTHOL 3 MG MT LOZG
LOZENGE | OROMUCOSAL | Status: AC
  Filled 2018-02-06: qty 9

## 2018-02-06 MED ORDER — PROPOFOL 10 MG/ML IV BOLUS
INTRAVENOUS | Status: AC
  Filled 2018-02-06: qty 20

## 2018-02-06 MED ORDER — MIDAZOLAM HCL 2 MG/2ML IJ SOLN
INTRAMUSCULAR | Status: AC
  Filled 2018-02-06: qty 2

## 2018-02-06 MED ORDER — FENTANYL CITRATE (PF) 100 MCG/2ML IJ SOLN
INTRAMUSCULAR | Status: AC
  Filled 2018-02-06: qty 2

## 2018-02-06 MED ORDER — VANCOMYCIN HCL 500 MG IV SOLR
INTRAVENOUS | Status: AC
  Filled 2018-02-06: qty 500

## 2018-02-06 MED ORDER — LACTATED RINGERS IV SOLN
INTRAVENOUS | Status: DC
  Administered 2018-02-06: 10:00:00 via INTRAVENOUS
  Filled 2018-02-06: qty 1000

## 2018-02-06 MED ORDER — CHLORHEXIDINE GLUCONATE 4 % EX LIQD
60.0000 mL | Freq: Once | CUTANEOUS | Status: DC
  Filled 2018-02-06: qty 118

## 2018-02-06 MED ORDER — PROMETHAZINE HCL 25 MG/ML IJ SOLN
6.2500 mg | INTRAMUSCULAR | Status: DC | PRN
  Filled 2018-02-06: qty 1

## 2018-02-06 MED ORDER — MIDAZOLAM HCL 2 MG/2ML IJ SOLN
2.0000 mg | Freq: Once | INTRAMUSCULAR | Status: AC
  Administered 2018-02-06: 2 mg via INTRAVENOUS
  Filled 2018-02-06: qty 2

## 2018-02-06 MED ORDER — KETOROLAC TROMETHAMINE 30 MG/ML IJ SOLN
30.0000 mg | Freq: Once | INTRAMUSCULAR | Status: DC | PRN
  Filled 2018-02-06: qty 1

## 2018-02-06 MED ORDER — PROPOFOL 10 MG/ML IV BOLUS
INTRAVENOUS | Status: DC | PRN
  Administered 2018-02-06: 200 mg via INTRAVENOUS

## 2018-02-06 MED ORDER — SODIUM CHLORIDE 0.9 % IV SOLN
INTRAVENOUS | Status: AC
  Filled 2018-02-06: qty 100

## 2018-02-06 MED ORDER — FENTANYL CITRATE (PF) 100 MCG/2ML IJ SOLN
100.0000 ug | Freq: Once | INTRAMUSCULAR | Status: AC
  Administered 2018-02-06: 50 ug via INTRAVENOUS
  Filled 2018-02-06: qty 2

## 2018-02-06 MED ORDER — VANCOMYCIN HCL IN DEXTROSE 1-5 GM/200ML-% IV SOLN
1000.0000 mg | INTRAVENOUS | Status: AC
  Administered 2018-02-06: 1000 mg via INTRAVENOUS
  Filled 2018-02-06: qty 200

## 2018-02-06 MED ORDER — ONDANSETRON HCL 4 MG/2ML IJ SOLN
INTRAMUSCULAR | Status: AC
  Filled 2018-02-06: qty 2

## 2018-02-06 MED ORDER — DEXAMETHASONE SODIUM PHOSPHATE 10 MG/ML IJ SOLN
INTRAMUSCULAR | Status: AC
  Filled 2018-02-06: qty 1

## 2018-02-06 MED ORDER — LIDOCAINE HCL (CARDIAC) PF 100 MG/5ML IV SOSY
PREFILLED_SYRINGE | INTRAVENOUS | Status: DC | PRN
  Administered 2018-02-06: 60 mg via INTRAVENOUS

## 2018-02-06 MED ORDER — ROPIVACAINE HCL 7.5 MG/ML IJ SOLN
INTRAMUSCULAR | Status: DC | PRN
  Administered 2018-02-06: 20 mL via PERINEURAL

## 2018-02-06 MED ORDER — FENTANYL CITRATE (PF) 100 MCG/2ML IJ SOLN
INTRAMUSCULAR | Status: DC | PRN
  Administered 2018-02-06: 25 ug via INTRAVENOUS

## 2018-02-06 MED ORDER — HYDROCODONE-ACETAMINOPHEN 5-325 MG PO TABS: 1.0000 | ORAL_TABLET | ORAL | 0 refills | Status: DC | PRN

## 2018-02-06 MED ORDER — FENTANYL CITRATE (PF) 100 MCG/2ML IJ SOLN
25.0000 ug | INTRAMUSCULAR | Status: DC | PRN
Start: 2018-02-06 — End: 2018-02-06
  Administered 2018-02-06: 50 ug via INTRAVENOUS
  Filled 2018-02-06: qty 1

## 2018-02-06 MED ORDER — VANCOMYCIN HCL IN DEXTROSE 1-5 GM/200ML-% IV SOLN
INTRAVENOUS | Status: AC
  Filled 2018-02-06: qty 200

## 2018-02-06 MED FILL — HYDROCODON-APAP 5-325: 5-325 | 1 days supply | Qty: 6 | Fill #0

## 2018-02-06 SURGICAL SUPPLY — 53 items
BLADE MINI RND TIP GREEN BEAV (BLADE) IMPLANT
BLADE SURG 15 STRL LF DISP TIS (BLADE) ×2 IMPLANT
BLADE SURG 15 STRL SS (BLADE) ×2
BNDG COHESIVE 3X5 TAN STRL LF (GAUZE/BANDAGES/DRESSINGS) ×12 IMPLANT
BNDG ESMARK 4X9 LF (GAUZE/BANDAGES/DRESSINGS) ×4 IMPLANT
BNDG GAUZE ELAST 4 BULKY (GAUZE/BANDAGES/DRESSINGS) ×4 IMPLANT
CHLORAPREP W/TINT 26ML (MISCELLANEOUS) ×4 IMPLANT
CORD BIPOLAR FORCEPS 12FT (ELECTRODE) ×4 IMPLANT
COVER BACK TABLE 60X90IN (DRAPES) ×4 IMPLANT
COVER MAYO STAND STRL (DRAPES) ×4 IMPLANT
CUFF TOURN SGL LL 18 NRW (TOURNIQUET CUFF) ×8 IMPLANT
CUFF TOURNIQUET SINGLE 18IN (TOURNIQUET CUFF) ×4 IMPLANT
DECANTER SPIKE VIAL GLASS SM (MISCELLANEOUS) IMPLANT
DRAPE EXTREMITY T 121X128X90 (DRAPE) ×4 IMPLANT
DRAPE SURG 17X23 STRL (DRAPES) ×4 IMPLANT
DRSG PAD ABDOMINAL 8X10 ST (GAUZE/BANDAGES/DRESSINGS) ×4 IMPLANT
GAUZE SPONGE 4X4 12PLY STRL (GAUZE/BANDAGES/DRESSINGS) ×4 IMPLANT
GAUZE SPONGE 4X4 16PLY XRAY LF (GAUZE/BANDAGES/DRESSINGS) IMPLANT
GAUZE XEROFORM 1X8 LF (GAUZE/BANDAGES/DRESSINGS) ×4 IMPLANT
GLOVE BIO SURGEON STRL SZ 6.5 (GLOVE) ×3 IMPLANT
GLOVE BIO SURGEONS STRL SZ 6.5 (GLOVE) ×1
GLOVE BIOGEL PI IND STRL 7.0 (GLOVE) ×4 IMPLANT
GLOVE BIOGEL PI IND STRL 8.5 (GLOVE) ×2 IMPLANT
GLOVE BIOGEL PI INDICATOR 7.0 (GLOVE) ×4
GLOVE BIOGEL PI INDICATOR 8.5 (GLOVE) ×2
GLOVE SURG ORTHO 8.0 STRL STRW (GLOVE) ×4 IMPLANT
GOWN STRL REUS W/ TWL LRG LVL3 (GOWN DISPOSABLE) ×2 IMPLANT
GOWN STRL REUS W/TWL LRG LVL3 (GOWN DISPOSABLE) ×2
GOWN STRL REUS W/TWL XL LVL3 (GOWN DISPOSABLE) ×4 IMPLANT
LOOP VESSEL MAXI BLUE (MISCELLANEOUS) IMPLANT
NDL SUT 6 .5 CRC .975X.05 MAYO (NEEDLE) IMPLANT
NEEDLE MAYO TAPER (NEEDLE)
NEEDLE PRECISIONGLIDE 27X1.5 (NEEDLE) IMPLANT
NS IRRIG 1000ML POUR BTL (IV SOLUTION) ×4 IMPLANT
PACK BASIN DAY SURGERY FS (CUSTOM PROCEDURE TRAY) ×4 IMPLANT
PAD CAST 3X4 CTTN HI CHSV (CAST SUPPLIES) ×2 IMPLANT
PAD CAST 4YDX4 CTTN HI CHSV (CAST SUPPLIES) ×2 IMPLANT
PADDING CAST ABS 4INX4YD NS (CAST SUPPLIES) ×2
PADDING CAST ABS COTTON 4X4 ST (CAST SUPPLIES) ×2 IMPLANT
PADDING CAST COTTON 3X4 STRL (CAST SUPPLIES) ×2
PADDING CAST COTTON 4X4 STRL (CAST SUPPLIES) ×2
SLEEVE SCD COMPRESS KNEE MED (MISCELLANEOUS) ×4 IMPLANT
SPLINT PLASTER CAST XFAST 3X15 (CAST SUPPLIES) IMPLANT
SPLINT PLASTER XTRA FASTSET 3X (CAST SUPPLIES)
STOCKINETTE 4X48 STRL (DRAPES) ×4 IMPLANT
SUT ETHILON 4 0 PS 2 18 (SUTURE) ×4 IMPLANT
SUT VIC AB 2-0 SH 27 (SUTURE) ×2
SUT VIC AB 2-0 SH 27XBRD (SUTURE) ×2 IMPLANT
SUT VICRYL 4-0 PS2 18IN ABS (SUTURE) ×4 IMPLANT
SYR BULB 3OZ (MISCELLANEOUS) ×4 IMPLANT
SYR CONTROL 10ML LL (SYRINGE) IMPLANT
TOWEL OR 17X24 6PK STRL BLUE (TOWEL DISPOSABLE) ×4 IMPLANT
UNDERPAD 30X30 (UNDERPADS AND DIAPERS) ×4 IMPLANT

## 2018-02-06 NOTE — Discharge Instructions (Addendum)
Post Anesthesia Home Care Instructions  Activity: Get plenty of rest for the remainder of the day. A responsible individual must stay with you for 24 hours following the procedure.  For the next 24 hours, DO NOT: -Drive a car -Paediatric nurse -Drink alcoholic beverages -Take any medication unless instructed by your physician -Make any legal decisions or sign important papers.  Meals: Start with liquid foods such as gelatin or soup. Progress to regular foods as tolerated. Avoid greasy, spicy, heavy foods. If nausea and/or vomiting occur, drink only clear liquids until the nausea and/or vomiting subsides. Call your physician if vomiting continues.  Special Instructions/Symptoms: Your throat may feel dry or sore from the anesthesia or the breathing tube placed in your throat during surgery. If this causes discomfort, gargle with warm salt water. The discomfort should disappear within 24 hours.  If you had a scopolamine patch placed behind your ear for the management of post- operative nausea and/or vomiting:  1. The medication in the patch is effective for 72 hours, after which it should be removed.  Wrap patch in a tissue and discard in the trash. Wash hands thoroughly with soap and water. 2. You may remove the patch earlier than 72 hours if you experience unpleasant side effects which may include dry mouth, dizziness or visual disturbances. 3. Avoid touching the patch. Wash your hands with soap and water after contact with the patch.   Regional Anesthesia Blocks  1. Numbness or the inability to move the "blocked" extremity may last from 3-48 hours after placement. The length of time depends on the medication injected and your individual response to the medication. If the numbness is not going away after 48 hours, call your surgeon.  2. The extremity that is blocked will need to be protected until the numbness is gone and the  Strength has returned. Because you cannot feel it, you will  need to take extra care to avoid injury. Because it may be weak, you may have difficulty moving it or using it. You may not know what position it is in without looking at it while the block is in effect.  3. For blocks in the legs and feet, returning to weight bearing and walking needs to be done carefully. You will need to wait until the numbness is entirely gone and the strength has returned. You should be able to move your leg and foot normally before you try and bear weight or walk. You will need someone to be with you when you first try to ensure you do not fall and possibly risk injury.  4. Bruising and tenderness at the needle site are common side effects and will resolve in a few days.  5. Persistent numbness or new problems with movement should be communicated to the surgeon or the  [facility] [phone]).   Hand Center Instructions Hand Surgery  Wound Care: Keep your hand elevated above the level of your heart.  Do not allow it to dangle by your side.  Keep the dressing dry and do not remove it unless your doctor advises you to do so.  He will usually change it at the time of your post-op visit.  Moving your fingers is advised to stimulate circulation but will depend on the site of your surgery.  If you have a splint applied, your doctor will advise you regarding movement.  Activity: Do not drive or operate machinery today.  Rest today and then you may return to your normal activity  and work as indicated by Naval [employer].  Diet:  Drink liquids today or eat a light diet.  You may resume a regular diet tomorrow.    General expectations: Pain for two to three days. Fingers may become slightly swollen.  Call your doctor if any of the following occur: Severe pain not relieved by pain medication. Elevated temperature. Dressing soaked with blood. Inability to move fingers. White or bluish color to fingers.

## 2018-02-06 NOTE — Anesthesia Procedure Notes (Signed)
Anesthesia Procedure Image    

## 2018-02-06 NOTE — Brief Op Note (Signed)
02/06/2018  12:53 PM  PATIENT:  Mabeline Caras  57 y.o. female  PRE-OPERATIVE DIAGNOSIS:  CARPAL TUNNEL RIGHT CUBITAL TUNNEL RIGHT  POST-OPERATIVE DIAGNOSIS:  CARPAL TUNNEL RIGHT CUBITAL TUNNEL RIGHT TENOSYNOVITIS  PROCEDURE:  Procedure(s) with comments: CARPAL TUNNEL RELEASE RIGHT TENOSYNOVECTOMY (Right) - regional block CUBITAL TUNNEL RELEASE (Right) - regoinal block ULNAR NERVE DECOMPRESSION (Right) - regional block   SURGEON:  Surgeon(s) and Role:    Daryll Brod, MD - Primary  PHYSICIAN ASSISTANT:   ASSISTANTS: none   ANESTHESIA:  Regional and IV sedation  EBL: 77ml  BLOOD ADMINISTERED:none  DRAINS: none   LOCAL MEDICATIONS USED:  NONE  SPECIMEN:  Excision  DISPOSITION OF SPECIMEN:  PATHOLOGY  COUNTS:  YES  TOURNIQUET:   Total Tourniquet Time Documented: Upper Arm (Right) - 52 minutes Total: Upper Arm (Right) - 52 minutes   DICTATION: .Other Dictation: Dictation Number [id number]  PLAN OF CARE: Discharge to home after PACU  PATIENT DISPOSITION:  PACU - hemodynamically stable.

## 2018-02-06 NOTE — Anesthesia Procedure Notes (Signed)
Anesthesia Regional Block: Supraclavicular block   Pre-Anesthetic Checklist: ,, timeout performed, Correct Patient, Correct Site, Correct Laterality, Correct Procedure, Correct Position, site marked, Risks and benefits discussed,  Surgical consent,  Pre-op evaluation,  At surgeon's request and post-op pain management  Laterality: Right  Prep: chloraprep       Needles:  Injection technique: Single-shot  Needle Type: Echogenic Needle     Needle Length: 9cm      Additional Needles:   Procedures:,,,, ultrasound used (permanent image in chart),,,,  Narrative:  Start time: 02/06/2018 10:44 AM End time: 02/06/2018 10:53 AM Injection made incrementally with aspirations every 5 mL.  Performed by: Personally  Anesthesiologist: Myrtie Soman, MD  Additional Notes: Patient tolerated the procedure well without complications

## 2018-02-06 NOTE — Transfer of Care (Signed)
Immediate Anesthesia Transfer of Care Note  Patient: [name]  Procedure(s) Performed: CARPAL TUNNEL RELEASE RIGHT TENOSYNOVECTOMY (Right Wrist) CUBITAL TUNNEL RELEASE (Right Elbow) ULNAR NERVE DECOMPRESSION (Right Elbow)  Patient Location: PACU  Anesthesia Type:General  Level of Consciousness: awake, alert  and oriented  Airway & Oxygen Therapy: Patient Spontanous Breathing and Patient connected to face mask oxygen  Post-op Assessment: Report given to RN and Post -op Vital signs reviewed and stable  Post vital signs: Reviewed and stable  Last Vitals:  Vitals Value Taken Time  BP 114/86 02/06/2018 12:57 PM  Temp 36.4 C 02/06/2018 12:57 PM  Pulse 83 02/06/2018 12:59 PM  Resp 12 02/06/2018 12:59 PM  SpO2 94 % 02/06/2018 12:59 PM  Vitals shown include unvalidated device data.  Last Pain:  Vitals:   02/06/18 0940  TempSrc:   PainSc: 0-No pain      Patients Stated Pain Goal: 5 (49/75/30 0511)  Complications: No apparent anesthesia complications

## 2018-02-06 NOTE — Op Note (Signed)
[name], [name] NO.:  [id number]  MEDICAL RECORD NO.:  [id number]  LOCATION:                                 FACILITY:  PHYSICIAN:  Daryll Brod, M.D.            DATE OF BIRTH:  DATE OF PROCEDURE:  02/06/2018 DATE OF DISCHARGE:                              OPERATIVE REPORT   PREOPERATIVE DIAGNOSIS:  Carpal tunnel syndrome and cubital tunnel syndrome, right arm with rheumatoid arthritis.  POSTOPERATIVE DIAGNOSIS:  Carpal tunnel syndrome and cubital tunnel syndrome, right arm with rheumatoid arthritis.  OPERATION:  Carpal tunnel release with tenosynovectomy of flexor tendons with decompression of ulnar nerve at the elbow.  SURGEON:  Daryll Brod, MD.  ASSISTANT:  None.  ANESTHESIA:  Supraclavicular block.  PLACE OF SURGERY:  [facility].  HISTORY:  The patient is a 57 year old female with a history of carpal tunnel syndrome, cubital tunnel syndrome with numbness and tingling of her right hand.  Nerve conductions are positive, revealing carpal tunnel syndrome, cubital tunnel syndrome on her right side.  She has elected to undergo decompression, possible transposition to the ulnar nerve with decompression and possible tenosynovectomy of the flexor tendons due to her rheumatoid arthritis at the wrist.  Pre, peri, and postoperative courses have been discussed along with risks and complications.  She is aware that there is no guarantee to the surgery, the possibility of infection, recurrence of injury to arteries, nerves, and tendons, incomplete relief of symptoms, and dystrophy.  In the preoperative area, the patient is seen, the extremity marked by both patient and surgeon, antibiotic given.  DESCRIPTION OF PROCEDURE:  The patient was brought to the operating room where a supraclavicular block was carried out without difficulty under the direction of the Anesthesia Department.  She was prepped using ChloraPrep in supine position with the right  arm free.  A 3-minute dry time was allowed and time out was taken confirming the patient and procedure.  The limb was exsanguinated with an Esmarch bandage. Tourniquet placed high on the arm was inflated to 250 mmHg.  A longitudinal incision was made in the right palm and carried down through subcutaneous tissue.  Bleeders were electrocauterized with bipolar.  The palmar fascia was split.  The superficial palmar arch was identified distally.  The flexor tendons to the ring and little fingers were identified.  The tenosynovial tissue was markedly thickened with a significant amount of fluid.  The flexor retinaculum was then released on its ulnar aspect after placing retractors retracting the median nerve radially and the ulnar nerve ulnarly.  It was decided to proceed with a tenosynovectomy due to the amount of fluid and the density of the tenosynovial tissue and thickening.  The incision was carried to the ulnar side at the wrist crease and then onto the distal forearm, carried down through subcutaneous tissue.  The bleeders were again electrocauterized with bipolar.  The remainder of the flexor retinaculum and the distal forearm fascia was released.  The median nerve was identified proximally.  Retractors were placed protecting the median nerve, and a synovectomy of the flexor tendon superficialis profundus was then undertaken to each of the  fingers.  A significant adherence was present to the profundus tendon.  This was dissected free with blunt and sharp dissection and using a rongeur.  The specimen was sent to Pathology.  The canal was explored.  No further lesions were identified. The wound was copiously irrigated with saline and the skin was closed with interrupted 4-0 nylon sutures.  A separate incision was then made over the medial epicondyle of the right elbow, carried down through subcutaneous tissue.  Bleeders were again electrocauterized. Neurovascular structures protected and  anconeus epitrochlearis muscle was present.  This was released from the epicondyle.  This revealed the ulnar nerve beneath it.  The dissection was carried distally.  A knee retractor was placed between the forearm fascia and the overlying skin and subcutaneous tissue.  A KMI guide for carpal tunnel release was then placed between the nerve and the flexor carpi ulnaris muscle, and both the superficial and deep fascia of the flexor carpi ulnaris were released for approximately 8 cm proximally.  Attention was then turned proximally.  The dissection had been carried distally.  The knee retractor was placed after dissecting the subcutaneous tissue from the brachial fascia.  The [facility] guide was placed between the ulnar nerve proximally and antebrachial fascia, and using the angled ENT scissors, the fascia was released proximally for the same approximately 7 cm.  The elbow was fully flexed.  The nerve did not translocate.  The wound was copiously irrigated with saline.  The anconeus epitrochlearis muscle was then sutured posteriorly.  This was done with 2-0 Vicryl sutures. Subcutaneous tissue was sutured with interrupted 4-0 Vicryl and the skin with interrupted 4-0 nylon sutures.  A sterile compressive dressing was applied from the wrist to the mid upper arm.  On deflation of the tourniquet, all fingers immediately pinked.  She was taken to the recovery room for observation in satisfactory condition.  She will be discharged to home to return to the [place] in 1 week, on Norco.          ______________________________ Daryll Brod, M.D.     GK/MEDQ  D:  02/06/2018  T:  02/06/2018  Job:  [id number]

## 2018-02-06 NOTE — Op Note (Signed)
Other Dictation: Dictation Number [id number]

## 2018-02-06 NOTE — Anesthesia Preprocedure Evaluation (Signed)
Anesthesia Evaluation  Patient identified by MRN, date of birth, ID band Patient awake    Reviewed: Allergy & Precautions, NPO status , Patient's Chart, lab work & pertinent test results  Airway Mallampati: II  TM Distance: >3 FB Neck ROM: Full    Dental no notable dental hx.    Pulmonary neg pulmonary ROS, former smoker,    Pulmonary exam normal breath sounds clear to auscultation       Cardiovascular negative cardio ROS Normal cardiovascular exam Rhythm:Regular Rate:Normal     Neuro/Psych negative neurological ROS  negative psych ROS   GI/Hepatic Neg liver ROS, GERD  ,  Endo/Other  Hypothyroidism   Renal/GU negative Renal ROS  negative genitourinary   Musculoskeletal  (+) Arthritis , Rheumatoid disorders,    Abdominal   Peds negative pediatric ROS (+)  Hematology negative hematology ROS (+)   Anesthesia Other Findings   Reproductive/Obstetrics negative OB ROS                             Anesthesia Physical Anesthesia Plan  ASA: III  Anesthesia Plan: General   Post-op Pain Management:  Regional for Post-op pain   Induction: Intravenous  PONV Risk Score and Plan: 3 and Ondansetron, Dexamethasone, Treatment may vary due to age or medical condition and Midazolam  Airway Management Planned: LMA  Additional Equipment:   Intra-op Plan:   Post-operative Plan: Extubation in OR  Informed Consent: I have reviewed the patients History and Physical, chart, labs and discussed the procedure including the risks, benefits and alternatives for the proposed anesthesia with the patient or authorized representative who has indicated his/her understanding and acceptance.   Dental advisory given  Plan Discussed with: CRNA and Surgeon  Anesthesia Plan Comments:         Anesthesia Quick Evaluation

## 2018-02-06 NOTE — Anesthesia Postprocedure Evaluation (Signed)
Anesthesia Post Note  Patient: [name]  Procedure(s) Performed: CARPAL TUNNEL RELEASE RIGHT TENOSYNOVECTOMY (Right Wrist) CUBITAL TUNNEL RELEASE (Right Elbow) ULNAR NERVE DECOMPRESSION (Right Elbow)     Patient location during evaluation: PACU Anesthesia Type: General Level of consciousness: awake and alert Pain management: pain level controlled Vital Signs Assessment: post-procedure vital signs reviewed and stable Respiratory status: spontaneous breathing, nonlabored ventilation and respiratory function stable Cardiovascular status: blood pressure returned to baseline and stable Postop Assessment: no apparent nausea or vomiting Anesthetic complications: no    Last Vitals:  Vitals:   02/06/18 1257 02/06/18 1300  BP: 114/86 124/75  Pulse: 88 83  Resp: 16 13  Temp: 36.4 C   SpO2: 95% 99%    Last Pain:  Vitals:   02/06/18 1300  TempSrc:   PainSc: 6                  Teniya Filter A.

## 2018-02-06 NOTE — H&P (Signed)
[name] is an 57 y.o. female.   Chief Complaint:numbness right handHPI: [name] is a 57 year old right-hand-dominant female referred by Dr. Jarvis Newcomer for consultation regarding numbness and tingling of her right hand all fingers and thumb. This been going on for at least 15 years. She has history of rheumatoid arthritis. She states that the numbness has become constant over the past month. She has had nerve conductions done by Dr. Jannifer Franklin revealing cubital tunnel carpal tunnel syndrome on her right side. This is of moderate severity. The actual numbers are not available. She was been treated in the past with cortisone. She states she is beginning to drop things. She states nothing seems to make it better or worse. She is awakened 7 out of 7 nights. She has no history of injury to the neck elbow or hands. Dr. Sharol Given is her rheumatologist. She has had a rotator cuff repair done in October 2018 and a back surgery low back in January 2019. He is on methotrexate and folic acid. She has a history of thyroid problems rheumatoid arthritis but no history of diabetes or gout. Family history is positive arthritis negative for the remainder is.      Past Medical History:  Diagnosis Date  . Abdominal pain of unknown etiology   . Anal fistula    hx of   . Aortic atherosclerosis ([place])   . Constipation    severe, history of  . DDD (degenerative disc disease), lumbar   . Depression   . Diarrhea    history of  . GERD (gastroesophageal reflux disease)   . History of hiatal hernia   . Hypothyroidism   . Iron deficiency anemia   . Left shoulder pain   . Low back pain   . Lumbar herniated disc   . Neck pain   . Numbness of right lower extremity   . Olecranon bursitis   . Plantar fasciitis, left   . Prediabetes   . RA (rheumatoid arthritis) ([place])   . Right carpal tunnel syndrome 01/09/2018  . S/P colonoscopy April 2011   Dr. Olevia Perches: mild diverticulosis, hyperplastic polyps, repeat in 10 years  .  S/P endoscopy April 2011   Dr. Olevia Perches: no hiatal hernia, generous opening to antrum., patent gastrojejunostomy  . Sciatic leg pain    Right  . Ulnar neuropathy at elbow, right 01/09/2018  . Vitamin D deficiency   . Weakness of right lower extremity     Past Surgical History:  Procedure Laterality Date  . anal fistulostomy     w/marsupialization an excision of anal tags  . BACK SURGERY  10/2017  . BREAST LUMPECTOMY Right    benign  . COLONOSCOPY    . ENDOMETRIAL ABLATION    . ESOPHAGOGASTRODUODENOSCOPY    . GASTRIC BYPASS     mini gastric bypass in [place] reportedly  . ROTATOR CUFF REPAIR  07/2017    Family History  Problem Relation Age of Onset  . COPD Mother        living  . Heart disease Father        deceased  . Colon cancer Neg Hx    Social History:  reports that she has quit smoking. She has never used smokeless tobacco. She reports that she drinks about 0.5 oz of alcohol per week. She reports that she does not use drugs.  Allergies:  Allergies  Allergen Reactions  . Penicillins     Medications Prior to Admission  Medication Sig Dispense Refill  .  calcium carbonate 1250 MG capsule Take 1,250 mg by mouth daily.    . Cholecalciferol (VITAMIN D3) 1000 UNITS tablet Take 1,000 Units by mouth daily.     . DULoxetine (CYMBALTA) 30 MG capsule TAKE ONE CAPSULE BY MOUTH ONCE DAILY 30 capsule 2  . Estradiol-Norethindrone Acet (ACTIVELLA PO) Take 0.5 mg by mouth daily.    . folic acid (FOLVITE) 1 MG tablet Take 1 mg by mouth daily.  0  . ibuprofen (ADVIL,MOTRIN) 600 MG tablet Take 600 mg by mouth every 6 (six) hours as needed for moderate pain.    Marland Kitchen levothyroxine (SYNTHROID, LEVOTHROID) 88 MCG tablet TAKE 1 TABLET BY MOUTH EVERY DAY 90 tablet 0  . methotrexate (RHEUMATREX) 2.5 MG tablet Take 3 tablets Monday, 3 tablets on Tuesday her rheumatologist    . oxyCODONE-acetaminophen (PERCOCET/ROXICET) 5-325 MG tablet Take 1 tablet by mouth every 4 (four) hours as needed. 20  tablet 0    No results found for this or any previous visit (from the past 48 hour(s)).  No results found.   Pertinent items are noted in HPI.  Blood pressure 114/71, pulse 63, temperature 98.4 F (36.9 C), temperature source Oral, resp. rate 18, height 5\' 4"  (1.626 m), weight 90.7 kg (200 lb), SpO2 99 %.  General appearance: alert, cooperative and appears stated age Head: Normocephalic, without obvious abnormality Neck: no JVD Resp: clear to auscultation bilaterally Cardio: regular rate and rhythm, S1, S2 normal, no murmur, click, rub or gallop GI: soft, non-tender; bowel sounds normal; no masses,  no organomegaly Extremities: numbness right hand Pulses: 2+ and symmetric Skin: Skin color, texture, turgor normal. No rashes or lesions Neurologic: Grossly normal Incision/Wound: na  Assessment/Plan Assessment:  1. Carpal tunnel syndrome of right wrist  2. Entrapment of right ulnar nerve    Plan: We have discussed the carpal tunnel cubital tunnel with her. Dr. Archie Endo notes are reviewed Dr. Tobey Grim notes are reviewed. Discussed possibility of decompression to the carpal canal along with the possibility of tenosynovectomy depending on the appearance of the tenosynovium tissue within there is any invasion of the flexor tendons. We have discussed possibility of cubital tunnel decompression with possible transposition. She would like to have both done to eliminate the possibility of having to return for second procedure on the elbow. Pre-peri-and postoperative course are discussed along with risks and complications. She is aware there is no guarantee to the surgery the possibility of infection recurrence injury to arteries nerves tendons complete relief symptoms and dystrophy. He is scheduled for carpal tunnel release with possible tenosynovectomy to cubital tunnel decompression with possible transposition ulnar nerve both at the right arm.Questions are encouraged and answered to her  satisfaction to the schedule as an outpatient under regional anesthesia.      Jarrett Chicoine R 02/06/2018, 10:32 AM

## 2018-02-06 NOTE — Anesthesia Procedure Notes (Signed)
Procedure Name: LMA Insertion Date/Time: 02/06/2018 11:47 AM Performed by: Maryella Shivers, CRNA Pre-anesthesia Checklist: Patient identified, Emergency Drugs available, Suction available and Patient being monitored Patient Re-evaluated:Patient Re-evaluated prior to induction Oxygen Delivery Method: Circle system utilized Preoxygenation: Pre-oxygenation with 100% oxygen Induction Type: IV induction Ventilation: Mask ventilation without difficulty LMA: LMA inserted LMA Size: 4.0 Number of attempts: 1 Airway Equipment and Method: Bite block Placement Confirmation: positive ETCO2 Tube secured with: Tape Dental Injury: Teeth and Oropharynx as per pre-operative assessment

## 2018-02-07 ENCOUNTER — Encounter (HOSPITAL_BASED_OUTPATIENT_CLINIC_OR_DEPARTMENT_OTHER): Payer: Self-pay | Admitting: Orthopedic Surgery

## 2018-02-26 ENCOUNTER — Other Ambulatory Visit: Payer: Self-pay | Admitting: Podiatry

## 2018-03-01 DIAGNOSIS — G5601 Carpal tunnel syndrome, right upper limb: Secondary | ICD-10-CM | POA: Diagnosis not present

## 2018-03-01 DIAGNOSIS — M25531 Pain in right wrist: Secondary | ICD-10-CM | POA: Diagnosis not present

## 2018-03-01 DIAGNOSIS — G5621 Lesion of ulnar nerve, right upper limb: Secondary | ICD-10-CM | POA: Diagnosis not present

## 2018-03-07 DIAGNOSIS — M25631 Stiffness of right wrist, not elsewhere classified: Secondary | ICD-10-CM | POA: Diagnosis not present

## 2018-03-07 DIAGNOSIS — M25641 Stiffness of right hand, not elsewhere classified: Secondary | ICD-10-CM | POA: Diagnosis not present

## 2018-03-15 DIAGNOSIS — M25641 Stiffness of right hand, not elsewhere classified: Secondary | ICD-10-CM | POA: Diagnosis not present

## 2018-03-15 DIAGNOSIS — M25631 Stiffness of right wrist, not elsewhere classified: Secondary | ICD-10-CM | POA: Diagnosis not present

## 2018-03-23 DIAGNOSIS — M25631 Stiffness of right wrist, not elsewhere classified: Secondary | ICD-10-CM | POA: Diagnosis not present

## 2018-03-23 DIAGNOSIS — M25641 Stiffness of right hand, not elsewhere classified: Secondary | ICD-10-CM | POA: Diagnosis not present

## 2018-03-31 ENCOUNTER — Other Ambulatory Visit: Payer: Self-pay | Admitting: Family Medicine

## 2018-04-15 ENCOUNTER — Other Ambulatory Visit (HOSPITAL_COMMUNITY): Payer: Self-pay | Admitting: Family Medicine

## 2018-04-16 NOTE — Telephone Encounter (Signed)
This +1 refill needs office visit 

## 2018-04-27 ENCOUNTER — Other Ambulatory Visit: Payer: Self-pay | Admitting: Family Medicine

## 2018-05-03 DIAGNOSIS — Z79899 Other long term (current) drug therapy: Secondary | ICD-10-CM | POA: Diagnosis not present

## 2018-05-03 DIAGNOSIS — M549 Dorsalgia, unspecified: Secondary | ICD-10-CM | POA: Diagnosis not present

## 2018-05-03 DIAGNOSIS — M545 Low back pain: Secondary | ICD-10-CM | POA: Diagnosis not present

## 2018-05-03 DIAGNOSIS — M79642 Pain in left hand: Secondary | ICD-10-CM | POA: Diagnosis not present

## 2018-05-03 DIAGNOSIS — M7582 Other shoulder lesions, left shoulder: Secondary | ICD-10-CM | POA: Diagnosis not present

## 2018-05-03 DIAGNOSIS — M79671 Pain in right foot: Secondary | ICD-10-CM | POA: Diagnosis not present

## 2018-05-03 DIAGNOSIS — M0579 Rheumatoid arthritis with rheumatoid factor of multiple sites without organ or systems involvement: Secondary | ICD-10-CM | POA: Diagnosis not present

## 2018-05-17 IMAGING — DX DG LUMBAR SPINE COMPLETE 4+V
5 series · 5 of 5 positions shown · non-contrast
Comparison: September 30, 2015

CLINICAL DATA: Lumbago with right-sided radicular symptoms

EXAM:
LUMBAR SPINE - COMPLETE 4+ VIEW

[l-spine ap]
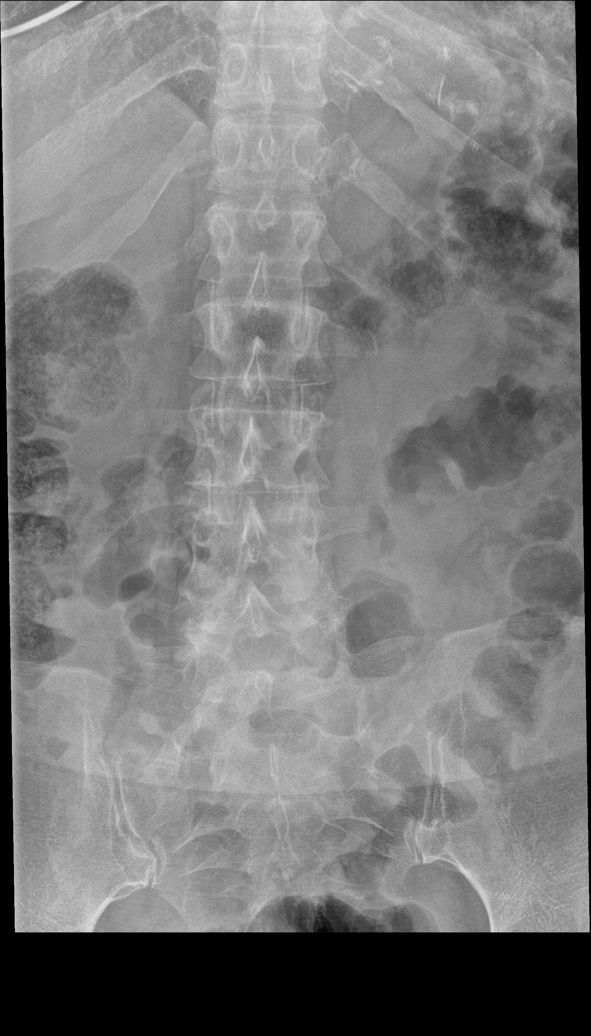

[l-spine obl (1 of 2)]
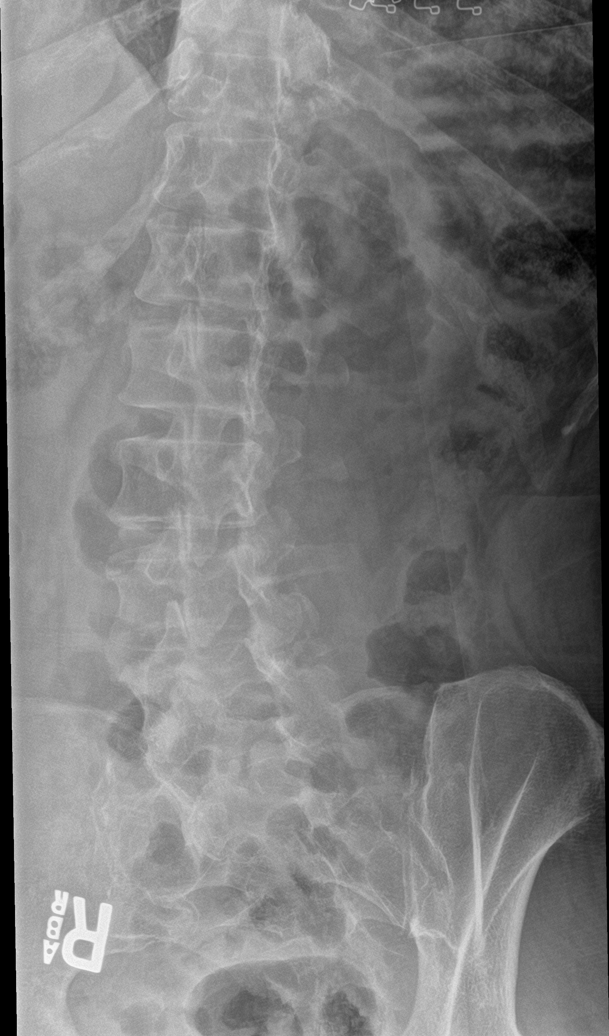

[l-spine obl (2 of 2)]
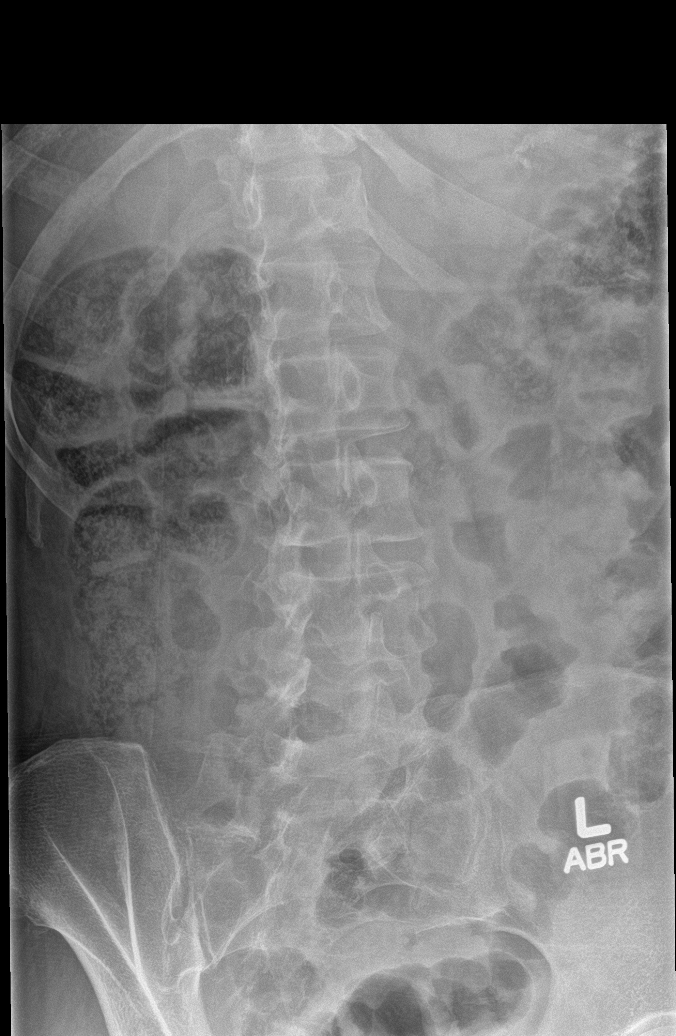

[l-spine lat]
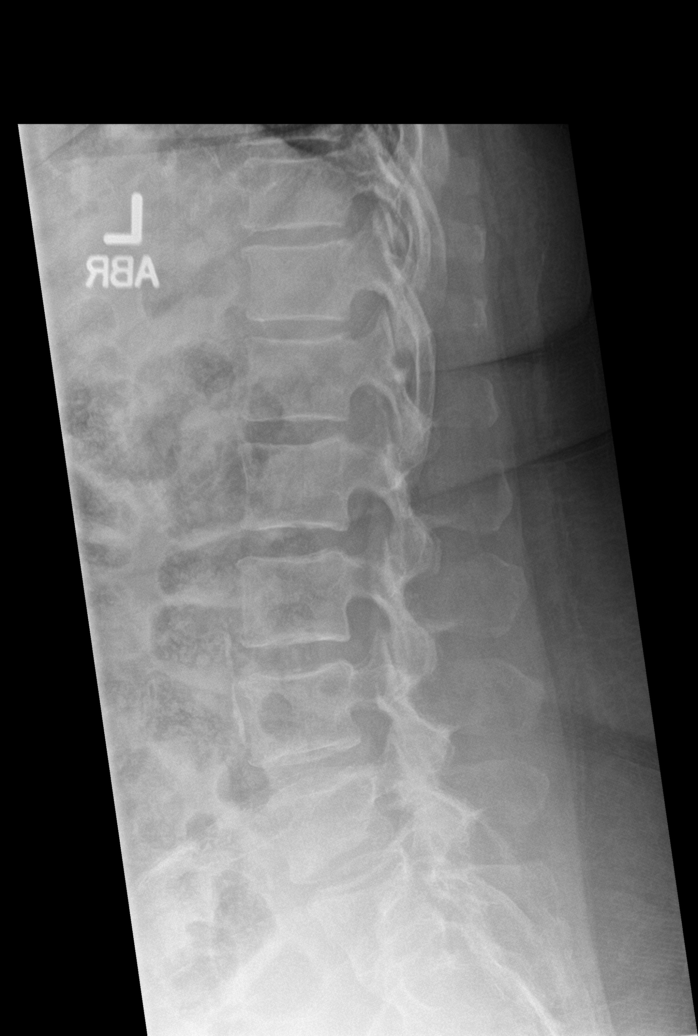

[l-spine spot]
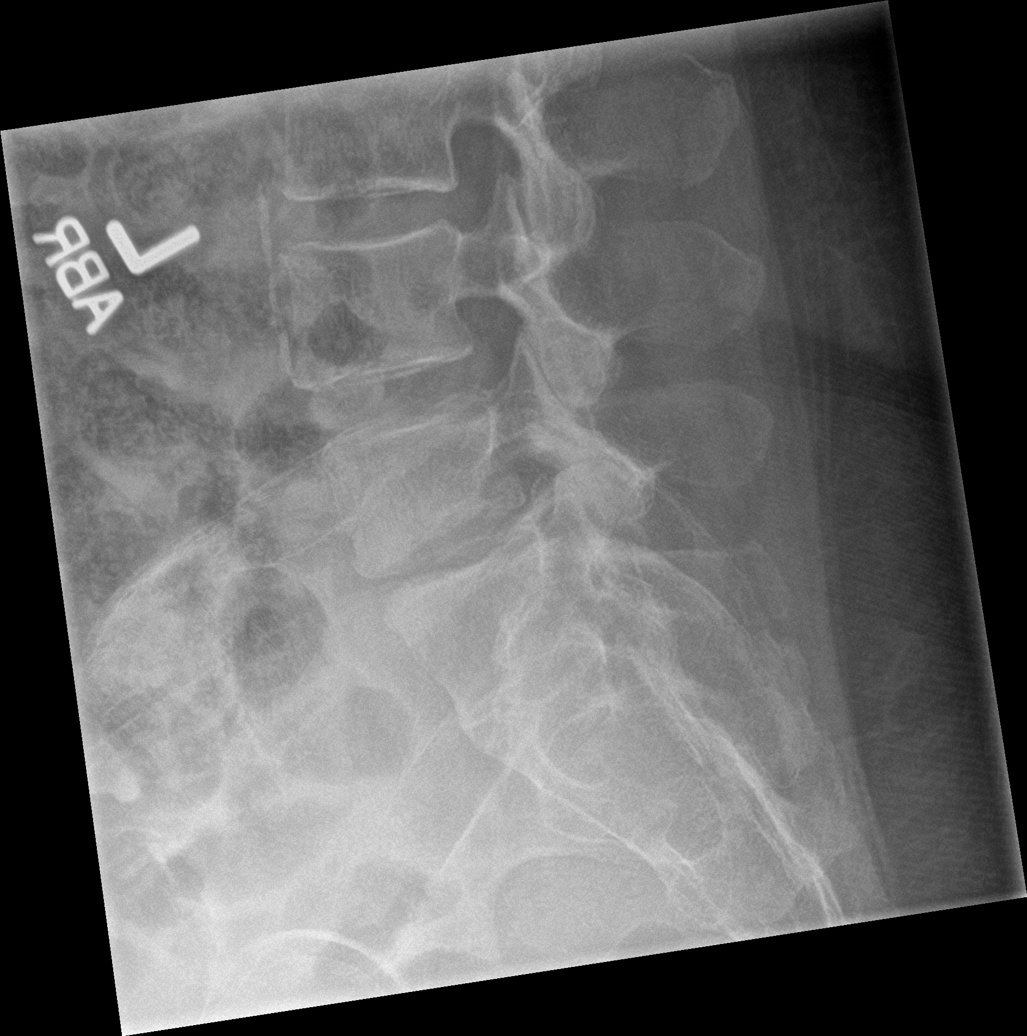

[5 of 5 positions shown; findings below may reference images not displayed]

FINDINGS: Frontal, lateral, spot lumbosacral lateral, and bilateral oblique
views were obtained. There are 4 non-rib-bearing lumbar type
vertebral bodies. There is no fracture or spondylolisthesis. The
disc spaces appear unremarkable. There is no appreciable facet
arthropathy. There is aortic atherosclerosis.
IMPRESSION: No fracture or spondylolisthesis. No appreciable arthropathic
change. There is aortic atherosclerosis.

Aortic Atherosclerosis (S3FOE-S45.5).

## 2018-05-27 ENCOUNTER — Other Ambulatory Visit: Payer: Self-pay | Admitting: Family Medicine

## 2018-06-11 ENCOUNTER — Other Ambulatory Visit (HOSPITAL_COMMUNITY): Payer: Self-pay | Admitting: Family Medicine

## 2018-06-12 NOTE — Telephone Encounter (Signed)
May have this +1 refill needs follow-up visit

## 2018-06-21 ENCOUNTER — Ambulatory Visit: Payer: 59 | Admitting: Family Medicine

## 2018-06-21 ENCOUNTER — Encounter: Payer: Self-pay | Admitting: Family Medicine

## 2018-06-21 VITALS — BP 124/88 | Temp 98.1°F | Ht 64.0 in | Wt 199.0 lb

## 2018-06-21 DIAGNOSIS — R252 Cramp and spasm: Secondary | ICD-10-CM | POA: Diagnosis not present

## 2018-06-21 DIAGNOSIS — M5432 Sciatica, left side: Secondary | ICD-10-CM

## 2018-06-21 DIAGNOSIS — E038 Other specified hypothyroidism: Secondary | ICD-10-CM | POA: Diagnosis not present

## 2018-06-21 MED ORDER — LEVOTHYROXINE SODIUM 88 MCG PO TABS: 88.0000 ug | ORAL_TABLET | Freq: Every day | ORAL | 3 refills | Status: DC

## 2018-06-21 MED ORDER — MELOXICAM 15 MG PO TABS: 15.0000 mg | ORAL_TABLET | Freq: Every day | ORAL | 0 refills | Status: DC

## 2018-06-21 MED ORDER — HYDROCODONE-ACETAMINOPHEN 5-325 MG PO TABS: 1.0000 | ORAL_TABLET | ORAL | 0 refills | Status: DC | PRN

## 2018-06-21 NOTE — Progress Notes (Signed)
   Subjective:    Patient ID: [name], female    DOB: Feb 22, 1961, 57 y.o.   MRN: [id number]  Back Pain  This is a chronic problem. The current episode started more than 1 month ago. The pain is present in the lumbar spine. The quality of the pain is described as aching and burning. The pain radiates to the left thigh. The pain is at a severity of 7/10. The pain is severe. The pain is the same all the time. Stiffness is present all day. Pertinent negatives include no abdominal pain, chest pain or headaches. She has tried nothing for the symptoms. The treatment provided no relief.   Patient is here today with left lower back upper buttock paint that radiates down her left leg. She states the pain is so bad it wakes her up, hard to get up in the am's. She states she has been taking motrin prn for the pain. She states this has been ongoing for a while,but getting worse.She has a history of back surgery one year ago.  Review of Systems  Constitutional: Negative for activity change, appetite change and fatigue.  HENT: Negative for congestion.   Respiratory: Negative for cough.   Cardiovascular: Negative for chest pain.  Gastrointestinal: Negative for abdominal pain.  Musculoskeletal: Positive for back pain. Negative for arthralgias.  Skin: Negative for color change.  Neurological: Negative for headaches.  Psychiatric/Behavioral: Negative for behavioral problems.  Patient also has a history of rheumatoid arthritis denies fever chills sweats Patient denies loss of bowel or bladder control    Objective:   Physical Exam  Constitutional: She appears well-nourished. No distress.  HENT:  Head: Normocephalic and atraumatic.  Eyes: Right eye exhibits no discharge. Left eye exhibits no discharge.  Cardiovascular: Normal rate, regular rhythm and normal heart sounds.  No murmur heard. Pulmonary/Chest: Effort normal and breath sounds normal. No respiratory distress.  Musculoskeletal: She exhibits  no edema.  Neurological: She is alert. Coordination normal.  Skin: Skin is warm and dry.  Psychiatric: She has a normal mood and affect. Her behavior is normal.  Vitals reviewed.  Patient with low back pain Tenderness in the lower lumbar spine Tenderness around the sacroiliac ligament on the left side Negative straight leg raise bilateral Reflexes good Strength in both legs good She can walk on her toes and heels.       Assessment & Plan:  Significant low back pain in the sacroiliac region along with sciatica into the left leg Pain medication for severe pain use only occasionally caution drowsiness not for long-term use Straight leg raises are negative strength is good Hopefully will gradually get better over the next 8 weeks If progressive troubles will start with lumbar spine x-rays Exercises shown in and out given Anti-inflammatory recommended for the next few weeks but not for long-term use  Patient to give [place] feedback in a couple weeks time how she is doing  Patient with intermittent cramping of the legs recommend lab work await the results of this

## 2018-06-22 LAB — POTASSIUM: Potassium: 4.3 mmol/L (ref 3.5–5.2)

## 2018-06-22 LAB — MAGNESIUM: MAGNESIUM: 2.4 mg/dL — AB (ref 1.6–2.3)

## 2018-06-22 LAB — TSH: TSH: 1.9 u[IU]/mL (ref 0.450–4.500)

## 2018-07-09 ENCOUNTER — Encounter: Payer: Self-pay | Admitting: Family Medicine

## 2018-07-18 ENCOUNTER — Other Ambulatory Visit: Payer: Self-pay | Admitting: Family Medicine

## 2018-07-26 ENCOUNTER — Other Ambulatory Visit (HOSPITAL_COMMUNITY): Payer: Self-pay | Admitting: Family Medicine

## 2018-07-27 NOTE — Telephone Encounter (Signed)
May have this +1 additional refill needs follow-up office visit

## 2018-08-03 ENCOUNTER — Ambulatory Visit: Payer: 59 | Admitting: Family Medicine

## 2018-08-03 ENCOUNTER — Ambulatory Visit (HOSPITAL_COMMUNITY)
Admission: RE | Admit: 2018-08-03 | Discharge: 2018-08-03 | Disposition: A | Discharge status: Home / Self Care | Payer: 59 | Source: Ambulatory Visit | Attending: Family Medicine | Admitting: Family Medicine

## 2018-08-03 ENCOUNTER — Encounter: Payer: Self-pay | Admitting: Family Medicine

## 2018-08-03 VITALS — BP 112/70 | Temp 98.2°F | Ht 64.0 in | Wt 196.8 lb

## 2018-08-03 DIAGNOSIS — R079 Chest pain, unspecified: Secondary | ICD-10-CM | POA: Diagnosis not present

## 2018-08-03 DIAGNOSIS — R053 Chronic cough: Secondary | ICD-10-CM

## 2018-08-03 DIAGNOSIS — R05 Cough: Secondary | ICD-10-CM | POA: Diagnosis not present

## 2018-08-03 MED ORDER — PANTOPRAZOLE SODIUM 40 MG PO TBEC: 40.0000 mg | DELAYED_RELEASE_TABLET | Freq: Every day | ORAL | 3 refills | Status: DC

## 2018-08-03 NOTE — Progress Notes (Signed)
   Subjective:    Patient ID: [name], female    DOB: May 15, 1961, 57 y.o.   MRN: [id number]  Cough  This is a new problem. Episode onset: one month or more. Pertinent negatives include no chest pain, ear pain, fever, rhinorrhea, shortness of breath or wheezing. Treatments tried: cough drops.   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  HENT: Negative for congestion, ear pain and rhinorrhea.   Eyes: Negative for discharge.  Respiratory: Positive for cough. Negative for shortness of breath and wheezing.   Cardiovascular: Negative for chest pain.       Objective:   Physical Exam  Constitutional: She appears well-developed.  HENT:  Head: Normocephalic.  Nose: Nose normal.  Mouth/Throat: Oropharynx is clear and moist. No oropharyngeal exudate.  Neck: Neck supple.  Cardiovascular: Normal rate and normal heart sounds.  No murmur heard. Pulmonary/Chest: Effort normal and breath sounds normal. She has no wheezes.  Lymphadenopathy:    She has no cervical adenopathy.  Skin: Skin is warm and dry.  Nursing note and vitals reviewed.   This cough is been present for 4 to 6 weeks      Assessment & Plan:  Will do chest x-ray We will go ahead with ENT referral for them to look at the larynx We will cover potential reflux with Protonix Follow-up here if ongoing troubles Hopefully ENT can figure out if this is a laryngeal reflux or  other issue No sign of asthma I doubt any true pulmonary issue at this point

## 2018-08-14 ENCOUNTER — Encounter: Payer: Self-pay | Admitting: Family Medicine

## 2018-08-16 ENCOUNTER — Other Ambulatory Visit: Payer: Self-pay | Admitting: Family Medicine

## 2018-08-24 DIAGNOSIS — Z87891 Personal history of nicotine dependence: Secondary | ICD-10-CM | POA: Diagnosis not present

## 2018-08-24 DIAGNOSIS — Z7289 Other problems related to lifestyle: Secondary | ICD-10-CM | POA: Diagnosis not present

## 2018-08-24 DIAGNOSIS — R05 Cough: Secondary | ICD-10-CM | POA: Diagnosis not present

## 2018-08-25 DIAGNOSIS — R059 Cough, unspecified: Secondary | ICD-10-CM | POA: Insufficient documentation

## 2018-08-27 ENCOUNTER — Telehealth: Payer: Self-pay | Admitting: Family Medicine

## 2018-08-27 DIAGNOSIS — M7582 Other shoulder lesions, left shoulder: Secondary | ICD-10-CM | POA: Diagnosis not present

## 2018-08-27 DIAGNOSIS — Z79899 Other long term (current) drug therapy: Secondary | ICD-10-CM | POA: Diagnosis not present

## 2018-08-27 DIAGNOSIS — M0579 Rheumatoid arthritis with rheumatoid factor of multiple sites without organ or systems involvement: Secondary | ICD-10-CM | POA: Diagnosis not present

## 2018-08-27 NOTE — Telephone Encounter (Signed)
See below

## 2018-08-27 NOTE — Telephone Encounter (Signed)
Patient states still having back pain was last seen 10/18 and was discuss having MRI if pain continued.She would like MRI of her back or what you suggest.

## 2018-08-27 NOTE — Telephone Encounter (Signed)
Patient is aware Dr.Scott out of office and will hear from [place] upon his return.

## 2018-08-27 NOTE — Telephone Encounter (Signed)
She will need follow up OV to help document current findings which will help in getting MRI approved OV can be done Thursday a.m. Or next week

## 2018-08-28 NOTE — Telephone Encounter (Signed)
Patient notified and scheduled follow up office visit.

## 2018-09-03 ENCOUNTER — Ambulatory Visit: Payer: 59 | Admitting: Family Medicine

## 2018-09-03 ENCOUNTER — Encounter: Payer: Self-pay | Admitting: Family Medicine

## 2018-09-03 VITALS — BP 132/82 | Ht 64.0 in | Wt 200.0 lb

## 2018-09-03 DIAGNOSIS — M5432 Sciatica, left side: Secondary | ICD-10-CM | POA: Diagnosis not present

## 2018-09-03 DIAGNOSIS — M545 Low back pain, unspecified: Secondary | ICD-10-CM

## 2018-09-03 NOTE — Progress Notes (Signed)
   Subjective:    Patient ID: [name], female    DOB: 1960/10/22, 57 y.o.   MRN: [id number]  Back Pain  This is a new problem. The current episode started more than 1 month ago. The problem occurs constantly. The problem has been gradually worsening since onset. The pain is present in the gluteal and lumbar spine. The quality of the pain is described as aching. The pain radiates to the left knee. The pain is at a severity of 7/10. The pain is moderate. The pain is the same all the time. The symptoms are aggravated by bending. Associated symptoms include leg pain, numbness and paresis. Pertinent negatives include no headaches, pelvic pain, perianal numbness, tingling or weakness. She has tried analgesics for the symptoms. The treatment provided no relief.   Patient arrives for a follow up on back pain. Patient states she is still having numbness in her leg and hears a lot of popping and cracking in her back. Severe low back pain this been present since August she had back surgery about a year ago she relates severe back pain radiates down the left leg interferes with ADLs at times feels weak other times burning pain discomfort numbness  Review of Systems  Genitourinary: Negative for pelvic pain.  Musculoskeletal: Positive for back pain.  Neurological: Positive for numbness. Negative for tingling, weakness and headaches.   Patient is tried conservative measures since late August she was seen in early September she is tried anti-inflammatories stretching exercise as well as back exercises without any help her symptoms are getting worse she has had previous back surgery    Objective:   Physical Exam  Constitutional: She appears well-nourished. No distress.  HENT:  Head: Normocephalic and atraumatic.  Eyes: Right eye exhibits no discharge. Left eye exhibits no discharge.  Neck: No tracheal deviation present.  Cardiovascular: Normal rate, regular rhythm and normal heart sounds.  No murmur  heard. Pulmonary/Chest: Effort normal and breath sounds normal. No respiratory distress.  Musculoskeletal: She exhibits no edema.  Lymphadenopathy:    She has no cervical adenopathy.  Neurological: She is alert. Coordination normal.  Skin: Skin is warm and dry.  Psychiatric: She has a normal mood and affect. Her behavior is normal.  Vitals reviewed.  Positive straight leg raise on the left side good strength in the muscles      Assessment & Plan:  Back pain with sciatica present for over 2 months Has history of back surgery Severe pain interfering with ADL Very important for the patient to progress forward with MRI given that this is not gone away MRI with contrast ordered

## 2018-09-05 ENCOUNTER — Telehealth: Payer: Self-pay | Admitting: Family Medicine

## 2018-09-05 ENCOUNTER — Other Ambulatory Visit: Payer: Self-pay | Admitting: Family Medicine

## 2018-09-05 DIAGNOSIS — M5432 Sciatica, left side: Secondary | ICD-10-CM

## 2018-09-05 DIAGNOSIS — M545 Low back pain, unspecified: Secondary | ICD-10-CM

## 2018-09-05 NOTE — Telephone Encounter (Signed)
MRI order changed

## 2018-09-05 NOTE — Telephone Encounter (Signed)
Please change MRI order to Lumbar spine with and without contrast  Spoke with MRI tech in radiology and due to pt's history of lumbar surgery we need to order with and without contrast  Also need to process prior auth for correct test (codes are different for each scan)  Please advise

## 2018-09-06 NOTE — Telephone Encounter (Signed)
Prior auth approved, MRI scheduled & pt notified - pt verbalized understanding

## 2018-09-12 ENCOUNTER — Ambulatory Visit (HOSPITAL_COMMUNITY)
Admission: RE | Admit: 2018-09-12 | Discharge: 2018-09-12 | Disposition: A | Discharge status: Home / Self Care | Payer: 59 | Source: Ambulatory Visit | Attending: Family Medicine | Admitting: Family Medicine

## 2018-09-12 DIAGNOSIS — M5117 Intervertebral disc disorders with radiculopathy, lumbosacral region: Secondary | ICD-10-CM | POA: Diagnosis not present

## 2018-09-12 DIAGNOSIS — M47816 Spondylosis without myelopathy or radiculopathy, lumbar region: Secondary | ICD-10-CM | POA: Diagnosis not present

## 2018-09-12 DIAGNOSIS — M47817 Spondylosis without myelopathy or radiculopathy, lumbosacral region: Secondary | ICD-10-CM | POA: Diagnosis not present

## 2018-09-12 DIAGNOSIS — M5442 Lumbago with sciatica, left side: Secondary | ICD-10-CM | POA: Diagnosis present

## 2018-09-12 DIAGNOSIS — M4807 Spinal stenosis, lumbosacral region: Secondary | ICD-10-CM | POA: Diagnosis not present

## 2018-09-12 MED ORDER — GADOBUTROL 1 MMOL/ML IV SOLN
9.0000 mL | Freq: Once | INTRAVENOUS | Status: AC | PRN
  Administered 2018-09-12: 9 mL via INTRAVENOUS

## 2018-09-13 ENCOUNTER — Other Ambulatory Visit: Payer: Self-pay | Admitting: Family Medicine

## 2018-09-23 ENCOUNTER — Telehealth: Payer: Self-pay | Admitting: Family Medicine

## 2018-09-23 NOTE — Telephone Encounter (Signed)
Notification was received from insurance company regarding methotrexate and meloxicam potential interaction especially if on high doses of methotrexate.  Patient is not on a high dose of methotrexate.  A copy of this is being sent to her rheumatologist for their awareness.  Apparently this combination can increase the toxicity of methotrexate

## 2018-09-25 ENCOUNTER — Telehealth: Payer: Self-pay | Admitting: Family Medicine

## 2018-09-25 ENCOUNTER — Other Ambulatory Visit (HOSPITAL_COMMUNITY): Payer: Self-pay | Admitting: Family Medicine

## 2018-09-25 DIAGNOSIS — M5432 Sciatica, left side: Secondary | ICD-10-CM

## 2018-09-25 NOTE — Telephone Encounter (Signed)
Pt called, a little upset she's not heard back from [place] on a recommendation for an injection to her lower back (see nurse note on result note of MRI)  Pt had lumbar surgery 10/2017 with Dr. Kathyrn Sheriff @ [facility]  Pt would like urgent referral to get injection by the end of the year  If ok, please initiate referral in system so I may process

## 2018-09-25 NOTE — Telephone Encounter (Signed)
Referral ordered in Epic. 

## 2018-09-25 NOTE — Telephone Encounter (Signed)
[place]/nurses It would be fine to go ahead with referral to the specialist please see what they can do about doing injections Hopefully by the end of the year

## 2018-10-04 DIAGNOSIS — M4317 Spondylolisthesis, lumbosacral region: Secondary | ICD-10-CM | POA: Diagnosis not present

## 2018-10-04 DIAGNOSIS — M5126 Other intervertebral disc displacement, lumbar region: Secondary | ICD-10-CM | POA: Diagnosis not present

## 2018-10-12 ENCOUNTER — Encounter (HOSPITAL_COMMUNITY): Payer: Self-pay | Admitting: Physical Therapy

## 2018-10-12 ENCOUNTER — Other Ambulatory Visit: Payer: Self-pay

## 2018-10-12 ENCOUNTER — Ambulatory Visit (HOSPITAL_COMMUNITY): Payer: 59 | Attending: Neurosurgery | Admitting: Physical Therapy

## 2018-10-12 DIAGNOSIS — R29898 Other symptoms and signs involving the musculoskeletal system: Secondary | ICD-10-CM | POA: Insufficient documentation

## 2018-10-12 DIAGNOSIS — M6281 Muscle weakness (generalized): Secondary | ICD-10-CM | POA: Insufficient documentation

## 2018-10-12 DIAGNOSIS — M5416 Radiculopathy, lumbar region: Secondary | ICD-10-CM | POA: Insufficient documentation

## 2018-10-12 NOTE — Patient Instructions (Addendum)
Isometric Abdominal    Lying on back with knees bent, tighten stomach by pressing elbows down. Hold ___5_ seconds. Repeat __5-10__ times per set. Do __1__ sets per session. Do _3___ sessions per day.  http://orth.exer.us/1086   Copyright  VHI. All rights reserved.  Pelvic Tilt: Posterior - Legs Bent (Supine)    Tighten stomach and flatten back by rolling pelvis down. Hold __3__ seconds. Relax. Repeat __10__ times per set. Do __1__ sets per session. Do __2__ sessions per day.  http://orth.exer.us/202   Copyright  VHI. All rights reserved.

## 2018-10-12 NOTE — Therapy (Signed)
[place], [place], [address] Phone: [phone]   Fax:  [phone]  Physical Therapy Evaluation  Patient Details  Name: [name] MRN: [id number] Date of Birth: 10-Jan-1961 Referring Provider (PT): Sharee Holster   Encounter Date: 10/12/2018  PT End of Session - 10/12/18 1214    Visit Number  1    Number of Visits  12    Date for PT Re-Evaluation  11/23/18    Authorization - Visit Number  2    Authorization - Number of Visits  60    PT Start Time  1125    PT Stop Time  1213    PT Time Calculation (min)  48 min    Activity Tolerance  Patient tolerated treatment well    Behavior During Therapy  [facility] for tasks assessed/performed       Past Medical History:  Diagnosis Date  . Abdominal pain of unknown etiology   . Anal fistula    hx of   . Aortic atherosclerosis ([place])   . Constipation    severe, history of  . DDD (degenerative disc disease), lumbar   . Depression   . Diarrhea    history of  . GERD (gastroesophageal reflux disease)   . History of hiatal hernia   . Hypothyroidism   . Iron deficiency anemia   . Left shoulder pain   . Low back pain   . Lumbar herniated disc   . Neck pain   . Numbness of right lower extremity   . Olecranon bursitis   . Plantar fasciitis, left   . Prediabetes   . RA (rheumatoid arthritis) ([place])   . Right carpal tunnel syndrome 01/09/2018  . S/P colonoscopy April 2011   Dr. Olevia Perches: mild diverticulosis, hyperplastic polyps, repeat in 10 years  . S/P endoscopy April 2011   Dr. Olevia Perches: no hiatal hernia, generous opening to antrum., patent gastrojejunostomy  . Sciatic leg pain    Right  . Ulnar neuropathy at elbow, right 01/09/2018  . Vitamin D deficiency   . Weakness of right lower extremity     Past Surgical History:  Procedure Laterality Date  . anal fistulostomy     w/marsupialization an excision of anal tags  . BACK SURGERY  10/2017  . BREAST  LUMPECTOMY Right    benign  . CARPAL TUNNEL WITH CUBITAL TUNNEL Right 02/06/2018   Procedure: CARPAL TUNNEL RELEASE RIGHT TENOSYNOVECTOMY;  Surgeon: Daryll Brod, MD;  Location: [facility];  Service: Orthopedics;  Laterality: Right;  regional block  . COLONOSCOPY    . ENDOMETRIAL ABLATION    . ESOPHAGOGASTRODUODENOSCOPY    . GASTRIC BYPASS     mini gastric bypass in [place] reportedly  . ROTATOR CUFF REPAIR  07/2017  . ULNAR NERVE TRANSPOSITION Right 02/06/2018   Procedure: ULNAR NERVE DECOMPRESSION;  Surgeon: Daryll Brod, MD;  Location: [facility];  Service: Orthopedics;  Laterality: Right;  regional block   . ULNAR TUNNEL RELEASE Right 02/06/2018   Procedure: CUBITAL TUNNEL RELEASE;  Surgeon: Daryll Brod, MD;  Location: [facility];  Service: Orthopedics;  Laterality: Right;  regoinal block    There were no vitals filed for this visit.   Subjective Assessment - 10/12/18 1133    Subjective  [name] states that she has had chronic back issues. She had surgery on her back last year due to a bulging disc and did well for several months.  She  began having increased back pain for the past six months which will not go away therefore she went to the MD who has referred her to therapy.   She would like like to get back to walking on the TM.      Pertinent History  past rotator cuff repair , carpal tunnel, back surgery    Limitations  Walking;Standing;Sitting;Reading;Lifting;House hold activities    How long can you sit comfortably?  10 minutes     How long can you stand comfortably?  15 minutes     How long can you walk comfortably?  15 minutes     Patient Stated Goals  to have less back pain     Currently in Pain?  Yes    Pain Score  2    highest pain 9/10; best 0/10    Pain Location  Back   numbness on the left side leg    Pain Orientation  Lower    Pain Descriptors / Indicators  Aching;Sore    Pain Type  Chronic pain    Pain Radiating  Towards  Lt leg numbness; to knee level     Pain Onset  More than a month ago    Pain Frequency  Intermittent    Aggravating Factors   vaccum, mopping     Pain Relieving Factors  heat and motrin    Effect of Pain on Daily Activities  limits         [facility] PT Assessment - 10/12/18 0001      Assessment   Medical Diagnosis  Spondylolisthesisi    Referring Provider (PT)  Nena Polio Nunkumar    Onset Date/Surgical Date  04/14/18    Next MD Visit  11/22/2017    Prior Therapy  none      Precautions   Precautions  None      Restrictions   Weight Bearing Restrictions  No      Balance Screen   Has the patient fallen in the past 6 months  No    Has the patient had a decrease in activity level because of a fear of falling?   Yes    Is the patient reluctant to leave their home because of a fear of falling?   No      Home Film/video editor residence    Home Access  Stairs to enter    Entrance Stairs-Number of Steps  3    Entrance Stairs-Rails  Right    [place]  One level      Prior Function   Level of Independence  Independent    Vocation  Full time employment    Vocation Requirements  walking, steps,     Leisure  walking       Cognition   Overall Cognitive Status  Within Functional Limits for tasks assessed      Observation/Other Assessments   Focus on Therapeutic Outcomes (FOTO)   57      Functional Tests   Functional tests  Single leg stance      Single Leg Stance   Comments  Rt 20"; Lt 39"       Posture/Postural Control   Posture/Postural Control  Postural limitations    Postural Limitations  Decreased lumbar lordosis;Decreased thoracic kyphosis      ROM / Strength   AROM / PROM / Strength  AROM;Strength      AROM   AROM Assessment Site  Lumbar    Lumbar Flexion  fingers 8" from floor reps improve sx     Lumbar - Right Side Bend  25 reps no change     Lumbar - Left Side Bend  30 reps no change       Strength   Strength Assessment Site   Hip;Knee;Ankle    Right/Left Hip  Right;Left    Right Hip Extension  3/5    Right Hip ABduction  3+/5    Left Hip Flexion  3/5    Left Hip Extension  3/5    Left Hip ABduction  3/5    Left Hip ADduction  3/5    Right/Left Knee  Right;Left    Right Knee Flexion  5/5    Right Knee Extension  4+/5    Left Knee Flexion  4-/5    Left Knee Extension  4+/5    Right/Left Ankle  Right;Left    Right Ankle Dorsiflexion  4/5    Left Ankle Dorsiflexion  4-/5      Ambulation/Gait   Ambulation Distance (Feet)  --   numbness begins at 48 seconds after walking 200 ft.                Objective measurements completed on examination: See above findings.      [facility] Adult PT Treatment/Exercise - 10/12/18 0001      Exercises   Exercises  Lumbar      Lumbar Exercises: Supine   Ab Set  10 reps    Pelvic Tilt  10 reps             PT Education - 10/12/18 1213    Education Details  HEP; proper bed mobility    Person(s) Educated  Patient    Methods  Explanation;Demonstration;Handout    Comprehension  Verbalized understanding;Returned demonstration       PT Short Term Goals - 10/12/18 1222      PT SHORT TERM GOAL #1   Title  PT to be able to walk for five minutes without having increased numbness down her left leg.     Baseline  Increased numbness after 45 seconds     Time  3    Period  Weeks    Status  New    Target Date  11/02/18      PT SHORT TERM GOAL #2   Title  PT to be able to demonstrate proper body mechanics for mopping, lifting and bed mobilty to decrease nerve irritation.     Time  3    Period  Weeks    Status  New        PT Long Term Goals - 10/12/18 1224      PT LONG TERM GOAL #1   Title  PT to be able to walk for 15 minutes without numbness in her left leg.     Time  6    Period  Weeks    Status  New    Target Date  11/23/18      PT LONG TERM GOAL #2   Title  PT back pain to be no greater than a 3/10 to allow pt to walk for 40 minutes in  comfort for shopping and recreational activity.     Time  6    Period  Weeks    Status  New      PT LONG TERM GOAL #3   Title  PT to no longer be waking at night due to back pain.     Time  6    Period  Weeks    Status  New      PT LONG TERM GOAL #4   Title  Pt  B LE strength to be at least 4+/5 to allow pt to go up and down a flight of steps with ease in a reciprocal manner for work duties.     Time  6    Period  Weeks    Status  New      PT LONG TERM GOAL #5   Title  PT to be able to mop and vaccum without having increased back pain     Time  6    Period  Weeks             Plan - 10/12/18 1215    Clinical Impression Statement  Ms. Merritts is a 57 yo female who has been having radiating lumbar pain for the past six months.  MRI demonstrates spondyloisthesis at L5-S1 level.  She is having incresed pain with standing, walking and completing household activity therefore she has been referred to skilled physical therapy to improve her functional abilty.  Examination demonstrated decreased ROM, decreased balance, decreased strength and decreased knowledge of proper body mechanics.  Ms. Lavery will benefit from skilled PT to address these issues and maximize her functional abiility.     History and Personal Factors relevant to plan of care:  RA, rotator cuff surgery, carpal tunnel surgey and past back surgery     Clinical Presentation  Stable    Clinical Decision Making  Moderate    Rehab Potential  Good    PT Frequency  2x / week    PT Duration  6 weeks    PT Treatment/Interventions  ADLs/Self Care Home Management;Therapeutic activities;Therapeutic exercise;Balance training;Dry needling;Manual techniques    PT Next Visit Plan  progress with lumbar stabilization exercises, begin hamstring stretching and manual if needed for pain    PT Home Exercise Plan  pelvic tilt/ ab set     Consulted and Agree with Plan of Care  Patient       Patient will benefit from skilled therapeutic  intervention in order to improve the following deficits and impairments:  Decreased activity tolerance, Decreased balance, Decreased range of motion, Decreased strength, Increased muscle spasms, Postural dysfunction, Improper body mechanics, Pain  Visit Diagnosis: Radiculopathy, lumbar region - Plan: PT plan of care cert/re-cert  Muscle weakness (generalized) - Plan: PT plan of care cert/re-cert     Problem List Patient Active Problem List   Diagnosis Date Noted  . Right carpal tunnel syndrome 01/09/2018  . Ulnar neuropathy at elbow, right 01/09/2018  . Elevated transaminase level 09/21/2015  . Vitamin D deficiency 09/21/2015  . History of gastric bypass 09/21/2015  . Anemia, iron deficiency 09/21/2015  . Hypothyroidism 05/20/2014  . Rheumatoid arthritis ([place]) 07/11/2013  . Constipation 01/19/2011  . Abdominal discomfort in left lower quadrant 01/19/2011  . GERD 01/01/2010  . HIATAL HERNIA 01/01/2010  . ANAL FISTULA 01/01/2010  . ABDOMINAL PAIN, UNSPECIFIED SITE 01/01/2010  Rayetta Humphrey, PT CLT [phone] 10/12/2018, 12:30 PM  [place] [address] [place], [place], [address] Phone: [phone]   Fax:  [phone]  Name: [name] MRN: [id number] Date of Birth: 04-20-1961

## 2018-10-16 ENCOUNTER — Encounter (HOSPITAL_COMMUNITY): Payer: Self-pay

## 2018-10-16 ENCOUNTER — Ambulatory Visit (HOSPITAL_COMMUNITY): Payer: 59

## 2018-10-16 DIAGNOSIS — R29898 Other symptoms and signs involving the musculoskeletal system: Secondary | ICD-10-CM

## 2018-10-16 DIAGNOSIS — M5416 Radiculopathy, lumbar region: Secondary | ICD-10-CM

## 2018-10-16 DIAGNOSIS — M6281 Muscle weakness (generalized): Secondary | ICD-10-CM

## 2018-10-16 NOTE — Therapy (Signed)
[place], [place], [address] Phone: [phone]   Fax:  [phone]  Physical Therapy Treatment  Patient Details  Name: [name] MRN: [id number] Date of Birth: 20-Jun-1961 Referring Provider (PT): Sharee Holster   Encounter Date: 10/16/2018  PT End of Session - 10/16/18 0923    Visit Number  2    Number of Visits  12    Date for PT Re-Evaluation  11/23/18    Authorization Type  [employer]    Authorization Time Period  Cert: 09/32-->03/23/1244    Authorization - Visit Number  3    Authorization - Number of Visits  60    PT Start Time  0906    PT Stop Time  0948    PT Time Calculation (min)  42 min    Activity Tolerance  Patient tolerated treatment well    Behavior During Therapy  [facility] for tasks assessed/performed       Past Medical History:  Diagnosis Date  . Abdominal pain of unknown etiology   . Anal fistula    hx of   . Aortic atherosclerosis ([place])   . Constipation    severe, history of  . DDD (degenerative disc disease), lumbar   . Depression   . Diarrhea    history of  . GERD (gastroesophageal reflux disease)   . History of hiatal hernia   . Hypothyroidism   . Iron deficiency anemia   . Left shoulder pain   . Low back pain   . Lumbar herniated disc   . Neck pain   . Numbness of right lower extremity   . Olecranon bursitis   . Plantar fasciitis, left   . Prediabetes   . RA (rheumatoid arthritis) ([place])   . Right carpal tunnel syndrome 01/09/2018  . S/P colonoscopy April 2011   Dr. Olevia Perches: mild diverticulosis, hyperplastic polyps, repeat in 10 years  . S/P endoscopy April 2011   Dr. Olevia Perches: no hiatal hernia, generous opening to antrum., patent gastrojejunostomy  . Sciatic leg pain    Right  . Ulnar neuropathy at elbow, right 01/09/2018  . Vitamin D deficiency   . Weakness of right lower extremity     Past Surgical History:  Procedure Laterality Date  . anal fistulostomy      w/marsupialization an excision of anal tags  . BACK SURGERY  10/2017  . BREAST LUMPECTOMY Right    benign  . CARPAL TUNNEL WITH CUBITAL TUNNEL Right 02/06/2018   Procedure: CARPAL TUNNEL RELEASE RIGHT TENOSYNOVECTOMY;  Surgeon: Daryll Brod, MD;  Location: [facility];  Service: Orthopedics;  Laterality: Right;  regional block  . COLONOSCOPY    . ENDOMETRIAL ABLATION    . ESOPHAGOGASTRODUODENOSCOPY    . GASTRIC BYPASS     mini gastric bypass in [place] reportedly  . ROTATOR CUFF REPAIR  07/2017  . ULNAR NERVE TRANSPOSITION Right 02/06/2018   Procedure: ULNAR NERVE DECOMPRESSION;  Surgeon: Daryll Brod, MD;  Location: [facility];  Service: Orthopedics;  Laterality: Right;  regional block   . ULNAR TUNNEL RELEASE Right 02/06/2018   Procedure: CUBITAL TUNNEL RELEASE;  Surgeon: Daryll Brod, MD;  Location: [facility];  Service: Orthopedics;  Laterality: Right;  regoinal block    There were no vitals filed for this visit.  Subjective Assessment - 10/16/18 0910    Subjective  Pt reports of pain today, does report some stiffness across lower back.  Does have some  numbness Lt lateral and anterior thigh.      Patient Stated Goals  to have less back pain     Currently in Pain?  No/denies                       [facility] Adult PT Treatment/Exercise - 10/16/18 0001      Lumbar Exercises: Stretches   Active Hamstring Stretch  Right;Left;2 reps;30 seconds    Active Hamstring Stretch Limitations  supine      Lumbar Exercises: Seated   Other Seated Lumbar Exercises  ab set and pelvic tilt 10x      Lumbar Exercises: Supine   Ab Set  10 reps    Pelvic Tilt  10 reps    Bent Knee Raise  10 reps;5 seconds    Bent Knee Raise Limitations  with ab set    Bridge  10 reps    Bridge Limitations  limited range, cueing for glut sets             PT Education - 10/16/18 [id number]    Education Details  Reviewed goals, assured compliance wiht HEP  with cueing to assist with abdominal activation; reviewed bed mobilty    Person(s) Educated  Patient    Methods  Explanation;Demonstration;Tactile cues;Verbal cues    Comprehension  Verbalized understanding;Returned demonstration       PT Short Term Goals - 10/12/18 1222      PT SHORT TERM GOAL #1   Title  PT to be able to walk for five minutes without having increased numbness down her left leg.     Baseline  Increased numbness after 45 seconds     Time  3    Period  Weeks    Status  New    Target Date  11/02/18      PT SHORT TERM GOAL #2   Title  PT to be able to demonstrate proper body mechanics for mopping, lifting and bed mobilty to decrease nerve irritation.     Time  3    Period  Weeks    Status  New        PT Long Term Goals - 10/12/18 1224      PT LONG TERM GOAL #1   Title  PT to be able to walk for 15 minutes without numbness in her left leg.     Time  6    Period  Weeks    Status  New    Target Date  11/23/18      PT LONG TERM GOAL #2   Title  PT back pain to be no greater than a 3/10 to allow pt to walk for 40 minutes in comfort for shopping and recreational activity.     Time  6    Period  Weeks    Status  New      PT LONG TERM GOAL #3   Title  PT to no longer be waking at night due to back pain.     Time  6    Period  Weeks    Status  New      PT LONG TERM GOAL #4   Title  Pt  B LE strength to be at least 4+/5 to allow pt to go up and down a flight of steps with ease in a reciprocal manner for work duties.     Time  6    Period  Weeks    Status  New  PT LONG TERM GOAL #5   Title  PT to be able to mop and vaccum without having increased back pain     Time  6    Period  Weeks            Plan - 10/16/18 0086    Clinical Impression Statement  Began session reviewing goals and assured compliance with HEP.  Some tactile and verbal cueing to improve abdominal activation.  Therex focus on core strengthening and stretches to address lumbar  mobility.  No reports of pain through session.      Rehab Potential  Good    PT Frequency  2x / week    PT Duration  6 weeks    PT Treatment/Interventions  ADLs/Self Care Home Management;Therapeutic activities;Therapeutic exercise;Balance training;Dry needling;Manual techniques    PT Next Visit Plan  Add clam wiht RTB next session and mobilty stretches [facility]).  Review abdominal activation and progress with lumbar stabilization exercises, continue wiht hamstring stretching and manual if needed for pain    PT Home Exercise Plan  pelvic tilt/ ab set        Patient will benefit from skilled therapeutic intervention in order to improve the following deficits and impairments:  Decreased activity tolerance, Decreased balance, Decreased range of motion, Decreased strength, Increased muscle spasms, Postural dysfunction, Improper body mechanics, Pain  Visit Diagnosis: Radiculopathy, lumbar region  Muscle weakness (generalized)  Other symptoms and signs involving the musculoskeletal system     Problem List Patient Active Problem List   Diagnosis Date Noted  . Right carpal tunnel syndrome 01/09/2018  . Ulnar neuropathy at elbow, right 01/09/2018  . Elevated transaminase level 09/21/2015  . Vitamin D deficiency 09/21/2015  . History of gastric bypass 09/21/2015  . Anemia, iron deficiency 09/21/2015  . Hypothyroidism 05/20/2014  . Rheumatoid arthritis ([place]) 07/11/2013  . Constipation 01/19/2011  . Abdominal discomfort in left lower quadrant 01/19/2011  . GERD 01/01/2010  . HIATAL HERNIA 01/01/2010  . ANAL FISTULA 01/01/2010  . ABDOMINAL PAIN, UNSPECIFIED SITE 01/01/2010   Ihor Austin, [place]; [place]  Aldona Lento 10/16/2018, 11:28 AM  [place], [place], [address] Phone: [phone]   Fax:  [phone]  Name: POCAHONTAS COHENOUR MRN: [id number] Date of Birth: 26-Dec-1960

## 2018-10-19 ENCOUNTER — Encounter (HOSPITAL_COMMUNITY): Payer: Self-pay

## 2018-10-19 ENCOUNTER — Other Ambulatory Visit: Payer: Self-pay | Admitting: Family Medicine

## 2018-10-19 ENCOUNTER — Ambulatory Visit (HOSPITAL_COMMUNITY): Payer: 59 | Attending: Neurosurgery

## 2018-10-19 DIAGNOSIS — R29898 Other symptoms and signs involving the musculoskeletal system: Secondary | ICD-10-CM | POA: Insufficient documentation

## 2018-10-19 DIAGNOSIS — M5416 Radiculopathy, lumbar region: Secondary | ICD-10-CM | POA: Insufficient documentation

## 2018-10-19 DIAGNOSIS — M6281 Muscle weakness (generalized): Secondary | ICD-10-CM

## 2018-10-19 NOTE — Therapy (Signed)
[place], [place], [address] Phone: [phone]   Fax:  [phone]  Physical Therapy Treatment  Patient Details  Name: [name] MRN: [id number] Date of Birth: 1961/04/13 Referring Provider (PT): Sharee Holster   Encounter Date: 10/19/2018  PT End of Session - 10/19/18 1707    Visit Number  3    Number of Visits  12    Date for PT Re-Evaluation  11/23/18    Authorization Type  [employer]    Authorization Time Period  Cert: 63/14-->06/23/262    Authorization - Visit Number  4    Authorization - Number of Visits  60    PT Start Time  7858   pt late for apt today   PT Stop Time  1732    PT Time Calculation (min)  34 min    Activity Tolerance  Patient tolerated treatment well    Behavior During Therapy  [facility] for tasks assessed/performed       Past Medical History:  Diagnosis Date  . Abdominal pain of unknown etiology   . Anal fistula    hx of   . Aortic atherosclerosis ([place])   . Constipation    severe, history of  . DDD (degenerative disc disease), lumbar   . Depression   . Diarrhea    history of  . GERD (gastroesophageal reflux disease)   . History of hiatal hernia   . Hypothyroidism   . Iron deficiency anemia   . Left shoulder pain   . Low back pain   . Lumbar herniated disc   . Neck pain   . Numbness of right lower extremity   . Olecranon bursitis   . Plantar fasciitis, left   . Prediabetes   . RA (rheumatoid arthritis) ([place])   . Right carpal tunnel syndrome 01/09/2018  . S/P colonoscopy April 2011   Dr. Olevia Perches: mild diverticulosis, hyperplastic polyps, repeat in 10 years  . S/P endoscopy April 2011   Dr. Olevia Perches: no hiatal hernia, generous opening to antrum., patent gastrojejunostomy  . Sciatic leg pain    Right  . Ulnar neuropathy at elbow, right 01/09/2018  . Vitamin D deficiency   . Weakness of right lower extremity     Past Surgical History:  Procedure Laterality Date  . anal  fistulostomy     w/marsupialization an excision of anal tags  . BACK SURGERY  10/2017  . BREAST LUMPECTOMY Right    benign  . CARPAL TUNNEL WITH CUBITAL TUNNEL Right 02/06/2018   Procedure: CARPAL TUNNEL RELEASE RIGHT TENOSYNOVECTOMY;  Surgeon: Daryll Brod, MD;  Location: [facility];  Service: Orthopedics;  Laterality: Right;  regional block  . COLONOSCOPY    . ENDOMETRIAL ABLATION    . ESOPHAGOGASTRODUODENOSCOPY    . GASTRIC BYPASS     mini gastric bypass in [place] reportedly  . ROTATOR CUFF REPAIR  07/2017  . ULNAR NERVE TRANSPOSITION Right 02/06/2018   Procedure: ULNAR NERVE DECOMPRESSION;  Surgeon: Daryll Brod, MD;  Location: [facility];  Service: Orthopedics;  Laterality: Right;  regional block   . ULNAR TUNNEL RELEASE Right 02/06/2018   Procedure: CUBITAL TUNNEL RELEASE;  Surgeon: Daryll Brod, MD;  Location: [facility];  Service: Orthopedics;  Laterality: Right;  regoinal block    There were no vitals filed for this visit.  Subjective Assessment - 10/19/18 1659    Subjective  Pt stated she took a 1.5-2 mile walk on [place], first time in awhile.  Was a little sore following last session, mainly stiffness today.   Reports decreased in intensity and numbness not going down as far with Lt LE.     Patient Stated Goals  to have less back pain     Currently in Pain?  No/denies    Pain Descriptors / Indicators  Tightness   Stiffness in lower back                      [facility] Adult PT Treatment/Exercise - 10/19/18 0001      Lumbar Exercises: Stretches   Active Hamstring Stretch  Right;Left;2 reps;30 seconds    Active Hamstring Stretch Limitations  supine    Single Knee to Chest Stretch  2 reps;30 seconds      Lumbar Exercises: Supine   Ab Set  10 reps    Pelvic Tilt  10 reps    Clam  10 reps;5 seconds;Limitations    Clam Limitations  with ab set and RTB    Bent Knee Raise  15 reps;5 seconds    Bent Knee Raise  Limitations  with ab set    Bridge  15 reps    Bridge Limitations  limited range, cueing for glut sets               PT Short Term Goals - 10/12/18 1222      PT SHORT TERM GOAL #1   Title  PT to be able to walk for five minutes without having increased numbness down her left leg.     Baseline  Increased numbness after 45 seconds     Time  3    Period  Weeks    Status  New    Target Date  11/02/18      PT SHORT TERM GOAL #2   Title  PT to be able to demonstrate proper body mechanics for mopping, lifting and bed mobilty to decrease nerve irritation.     Time  3    Period  Weeks    Status  New        PT Long Term Goals - 10/12/18 1224      PT LONG TERM GOAL #1   Title  PT to be able to walk for 15 minutes without numbness in her left leg.     Time  6    Period  Weeks    Status  New    Target Date  11/23/18      PT LONG TERM GOAL #2   Title  PT back pain to be no greater than a 3/10 to allow pt to walk for 40 minutes in comfort for shopping and recreational activity.     Time  6    Period  Weeks    Status  New      PT LONG TERM GOAL #3   Title  PT to no longer be waking at night due to back pain.     Time  6    Period  Weeks    Status  New      PT LONG TERM GOAL #4   Title  Pt  B LE strength to be at least 4+/5 to allow pt to go up and down a flight of steps with ease in a reciprocal manner for work duties.     Time  6    Period  Weeks    Status  New  PT LONG TERM GOAL #5   Title  PT to be able to mop and vaccum without having increased back pain     Time  6    Period  Weeks            Plan - 10/19/18 1709    Clinical Impression Statement  Pt late for session today.  Continued session foucs with lumbar stability.  Pt does continue to require some cueing to improve abdominal activation though less cueing to breath with task.  Add SKTC for mobility and clam for stability.  Pt given advanced core and proximal strengthening exercises for HEP.  No  reoprts of pain through session.      Rehab Potential  Good    PT Frequency  2x / week    PT Duration  6 weeks    PT Treatment/Interventions  ADLs/Self Care Home Management;Therapeutic activities;Therapeutic exercise;Balance training;Dry needling;Manual techniques    PT Next Visit Plan  Begin CKC exercises including squats and SLS.  Progress with lumbar stabilization exercises, continue wiht hamstring stretching and manual if needed for pain    PT Home Exercise Plan  pelvic tilt/ ab set ; 10/19/2018: bent knee raise with ab set       Patient will benefit from skilled therapeutic intervention in order to improve the following deficits and impairments:  Decreased activity tolerance, Decreased balance, Decreased range of motion, Decreased strength, Increased muscle spasms, Postural dysfunction, Improper body mechanics, Pain  Visit Diagnosis: Radiculopathy, lumbar region  Muscle weakness (generalized)     Problem List Patient Active Problem List   Diagnosis Date Noted  . Right carpal tunnel syndrome 01/09/2018  . Ulnar neuropathy at elbow, right 01/09/2018  . Elevated transaminase level 09/21/2015  . Vitamin D deficiency 09/21/2015  . History of gastric bypass 09/21/2015  . Anemia, iron deficiency 09/21/2015  . Hypothyroidism 05/20/2014  . Rheumatoid arthritis ([place]) 07/11/2013  . Constipation 01/19/2011  . Abdominal discomfort in left lower quadrant 01/19/2011  . GERD 01/01/2010  . HIATAL HERNIA 01/01/2010  . ANAL FISTULA 01/01/2010  . ABDOMINAL PAIN, UNSPECIFIED SITE 01/01/2010   Ihor Austin, [place]; [place]  Aldona Lento 10/19/2018, 6:23 PM  [facility] [address] [place], [place], [address] Phone: [phone]   Fax:  [phone]  Name: [name] MRN: [id number] Date of Birth: 12-06-1960

## 2018-10-19 NOTE — Patient Instructions (Addendum)
Bracing With Leg March (Hook-Lying)    With neutral spine, tighten pelvic floor and abdominals and hold. Alternating legs, lift foot 8 inches and return to floor. Repeat 10-20 times. Do 1-2 times a day.  Bridge    Lie back, legs bent. Inhale, pressing hips up. Keeping ribs in, lengthen lower back. Exhale, rolling down along spine from top. Repeat 10 times. Do 1-2 sessions per day.  http://pm.exer.us/55   Copyright  VHI. All rights reserved.   Hamstring Stretch    With other leg bent, foot flat, grasp right leg and slowly try to straighten knee. Hold 30 seconds. Repeat 3 times. Do 1-2 sessions per day.  http://gt2.exer.us/280   Copyright  VHI. All rights reserved.

## 2018-10-24 ENCOUNTER — Ambulatory Visit (HOSPITAL_COMMUNITY): Payer: 59

## 2018-10-30 ENCOUNTER — Encounter (HOSPITAL_COMMUNITY): Payer: Self-pay

## 2018-10-30 ENCOUNTER — Ambulatory Visit (HOSPITAL_COMMUNITY): Payer: 59

## 2018-10-30 DIAGNOSIS — R29898 Other symptoms and signs involving the musculoskeletal system: Secondary | ICD-10-CM

## 2018-10-30 DIAGNOSIS — M5416 Radiculopathy, lumbar region: Secondary | ICD-10-CM | POA: Diagnosis not present

## 2018-10-30 DIAGNOSIS — M6281 Muscle weakness (generalized): Secondary | ICD-10-CM

## 2018-10-30 NOTE — Therapy (Signed)
[place], [place], [address] Phone: [phone]   Fax:  [phone]  Physical Therapy Treatment  Patient Details  Name: [name] MRN: [id number] Date of Birth: [birth year] Referring Provider (PT): Sharee Holster   Encounter Date: 10/30/2018  PT End of Session - 10/30/18 1752    Visit Number  4    Number of Visits  12    Date for PT Re-Evaluation  11/23/18    Authorization Type  UHC    Authorization Time Period  Cert: 50/35-->01/21/5680    Authorization - Visit Number  5    Authorization - Number of Visits  60    PT Start Time  2751   Pt late for apt   PT Stop Time  1817    PT Time Calculation (min)  29 min    Activity Tolerance  Patient tolerated treatment well    Behavior During Therapy  [facility] for tasks assessed/performed       Past Medical History:  Diagnosis Date  . Abdominal pain of unknown etiology   . Anal fistula    hx of   . Aortic atherosclerosis ([place])   . Constipation    severe, history of  . DDD (degenerative disc disease), lumbar   . Depression   . Diarrhea    history of  . GERD (gastroesophageal reflux disease)   . History of hiatal hernia   . Hypothyroidism   . Iron deficiency anemia   . Left shoulder pain   . Low back pain   . Lumbar herniated disc   . Neck pain   . Numbness of right lower extremity   . Olecranon bursitis   . Plantar fasciitis, left   . Prediabetes   . RA (rheumatoid arthritis) ([place])   . Right carpal tunnel syndrome 01/09/2018  . S/P colonoscopy April 2011   Dr. Olevia Perches: mild diverticulosis, hyperplastic polyps, repeat in 10 years  . S/P endoscopy April 2011   Dr. Olevia Perches: no hiatal hernia, generous opening to antrum., patent gastrojejunostomy  . Sciatic leg pain    Right  . Ulnar neuropathy at elbow, right 01/09/2018  . Vitamin D deficiency   . Weakness of right lower extremity     Past Surgical History:  Procedure Laterality Date  . anal  fistulostomy     w/marsupialization an excision of anal tags  . BACK SURGERY  10/2017  . BREAST LUMPECTOMY Right    benign  . CARPAL TUNNEL WITH CUBITAL TUNNEL Right 02/06/2018   Procedure: CARPAL TUNNEL RELEASE RIGHT TENOSYNOVECTOMY;  Surgeon: Daryll Brod, MD;  Location: [facility];  Service: Orthopedics;  Laterality: Right;  regional block  . COLONOSCOPY    . ENDOMETRIAL ABLATION    . ESOPHAGOGASTRODUODENOSCOPY    . GASTRIC BYPASS     mini gastric bypass in [place] reportedly  . ROTATOR CUFF REPAIR  07/2017  . ULNAR NERVE TRANSPOSITION Right 02/06/2018   Procedure: ULNAR NERVE DECOMPRESSION;  Surgeon: Daryll Brod, MD;  Location: [facility];  Service: Orthopedics;  Laterality: Right;  regional block   . ULNAR TUNNEL RELEASE Right 02/06/2018   Procedure: CUBITAL TUNNEL RELEASE;  Surgeon: Daryll Brod, MD;  Location: [facility];  Service: Orthopedics;  Laterality: Right;  regoinal block    There were no vitals filed for this visit.  Subjective Assessment - 10/30/18 1750    Subjective  Pt late for apt today, reports she was unable to get  off work.  Pt reports increased pain yesterday, had to take meds to go to sleep.  Pain scale 4/10 LBP soreness    Pertinent History  past rotator cuff repair , carpal tunnel, back surgery    Patient Stated Goals  to have less back pain     Currently in Pain?  Yes    Pain Score  4     Pain Location  Back    Pain Orientation  Lower    Pain Descriptors / Indicators  Sore    Pain Type  Chronic pain    Pain Radiating Towards  Lt LE numbness to knee level    Pain Onset  More than a month ago    Pain Frequency  Intermittent    Aggravating Factors   vaccum, mopping    Pain Relieving Factors  heat and motrin    Effect of Pain on Daily Activities  limits                       [facility] Adult PT Treatment/Exercise - 10/30/18 0001      Lumbar Exercises: Stretches   Active Hamstring Stretch   Right;Left;2 reps;30 seconds    Active Hamstring Stretch Limitations  supine    Single Knee to Chest Stretch  2 reps;30 seconds      Lumbar Exercises: Standing   Functional Squats  10 reps    Functional Squats Limitations  chair taps    Other Standing Lumbar Exercises  SLS Lt 33", Rt 26" max      Lumbar Exercises: Supine   Clam  10 reps;5 seconds;Limitations    Clam Limitations  with ab set and RTB    Bridge  15 reps    Bridge Limitations  limited range, cueing for glut sets               PT Short Term Goals - 10/12/18 1222      PT SHORT TERM GOAL #1   Title  PT to be able to walk for five minutes without having increased numbness down her left leg.     Baseline  Increased numbness after 45 seconds     Time  3    Period  Weeks    Status  New    Target Date  11/02/18      PT SHORT TERM GOAL #2   Title  PT to be able to demonstrate proper body mechanics for mopping, lifting and bed mobilty to decrease nerve irritation.     Time  3    Period  Weeks    Status  New        PT Long Term Goals - 10/12/18 1224      PT LONG TERM GOAL #1   Title  PT to be able to walk for 15 minutes without numbness in her left leg.     Time  6    Period  Weeks    Status  New    Target Date  11/23/18      PT LONG TERM GOAL #2   Title  PT back pain to be no greater than a 3/10 to allow pt to walk for 40 minutes in comfort for shopping and recreational activity.     Time  6    Period  Weeks    Status  New      PT LONG TERM GOAL #3   Title  PT to no longer be waking at night due  to back pain.     Time  6    Period  Weeks    Status  New      PT LONG TERM GOAL #4   Title  Pt  B LE strength to be at least 4+/5 to allow pt to go up and down a flight of steps with ease in a reciprocal manner for work duties.     Time  6    Period  Weeks    Status  New      PT LONG TERM GOAL #5   Title  PT to be able to mop and vaccum without having increased back pain     Time  6    Period   Weeks            Plan - 10/30/18 1808    Clinical Impression Statement  Pt late for session today.  Session focus with lumbar mobility for pain control and lumbar stability for core and proximal strengthening.  Added squats for gluteal strengthening and SLS for stability.     Rehab Potential  Good    PT Frequency  2x / week    PT Duration  6 weeks    PT Treatment/Interventions  ADLs/Self Care Home Management;Therapeutic activities;Therapeutic exercise;Balance training;Dry needling;Manual techniques    PT Next Visit Plan  Continue CKC exercises.  Progress with lumbar stabilization exercises, continue wiht hamstring stretching and manual if needed for pain    PT Home Exercise Plan  pelvic tilt/ ab set ; 10/19/2018: bent knee raise with ab set       Patient will benefit from skilled therapeutic intervention in order to improve the following deficits and impairments:  Decreased activity tolerance, Decreased balance, Decreased range of motion, Decreased strength, Increased muscle spasms, Postural dysfunction, Improper body mechanics, Pain  Visit Diagnosis: Radiculopathy, lumbar region  Muscle weakness (generalized)  Other symptoms and signs involving the musculoskeletal system     Problem List Patient Active Problem List   Diagnosis Date Noted  . Right carpal tunnel syndrome 01/09/2018  . Ulnar neuropathy at elbow, right 01/09/2018  . Elevated transaminase level 09/21/2015  . Vitamin D deficiency 09/21/2015  . History of gastric bypass 09/21/2015  . Anemia, iron deficiency 09/21/2015  . Hypothyroidism 05/20/2014  . Rheumatoid arthritis ([place]) 07/11/2013  . Constipation 01/19/2011  . Abdominal discomfort in left lower quadrant 01/19/2011  . GERD 01/01/2010  . HIATAL HERNIA 01/01/2010  . ANAL FISTULA 01/01/2010  . ABDOMINAL PAIN, UNSPECIFIED SITE 01/01/2010   Ihor Austin, [place]; [place]  Aldona Lento 10/30/2018, 6:20 PM  [place], [place], [address] Phone: [phone]   Fax:  [phone]  Name: [name] MRN: [id number] Date of Birth: 11-29-1960

## 2018-11-01 ENCOUNTER — Encounter (HOSPITAL_COMMUNITY): Payer: Self-pay

## 2018-11-01 ENCOUNTER — Ambulatory Visit (HOSPITAL_COMMUNITY): Payer: 59

## 2018-11-01 DIAGNOSIS — M5416 Radiculopathy, lumbar region: Secondary | ICD-10-CM | POA: Diagnosis not present

## 2018-11-01 DIAGNOSIS — M6281 Muscle weakness (generalized): Secondary | ICD-10-CM

## 2018-11-01 DIAGNOSIS — R29898 Other symptoms and signs involving the musculoskeletal system: Secondary | ICD-10-CM

## 2018-11-01 NOTE — Therapy (Signed)
[place], [place], [address] Phone: [phone]   Fax:  [phone]  Physical Therapy Treatment  Patient Details  Name: [name] MRN: [id number] Date of Birth: 06-01-1961 Referring Provider (PT): Sharee Holster   Encounter Date: 11/01/2018  PT End of Session - 11/01/18 1740    Visit Number  5    Number of Visits  12    Date for PT Re-Evaluation  11/23/18    Authorization Type  UHC    Authorization Time Period  Cert: 67/20-->06/21/7095    Authorization - Visit Number  6    Authorization - Number of Visits  60    PT Start Time  2836    PT Stop Time  6294   pt requested to leave early   PT Time Calculation (min)  35 min    Activity Tolerance  Patient tolerated treatment well    Behavior During Therapy  [facility] for tasks assessed/performed       Past Medical History:  Diagnosis Date  . Abdominal pain of unknown etiology   . Anal fistula    hx of   . Aortic atherosclerosis ([place])   . Constipation    severe, history of  . DDD (degenerative disc disease), lumbar   . Depression   . Diarrhea    history of  . GERD (gastroesophageal reflux disease)   . History of hiatal hernia   . Hypothyroidism   . Iron deficiency anemia   . Left shoulder pain   . Low back pain   . Lumbar herniated disc   . Neck pain   . Numbness of right lower extremity   . Olecranon bursitis   . Plantar fasciitis, left   . Prediabetes   . RA (rheumatoid arthritis) ([place])   . Right carpal tunnel syndrome 01/09/2018  . S/P colonoscopy April 2011   Dr. Olevia Perches: mild diverticulosis, hyperplastic polyps, repeat in 10 years  . S/P endoscopy April 2011   Dr. Olevia Perches: no hiatal hernia, generous opening to antrum., patent gastrojejunostomy  . Sciatic leg pain    Right  . Ulnar neuropathy at elbow, right 01/09/2018  . Vitamin D deficiency   . Weakness of right lower extremity     Past Surgical History:  Procedure Laterality Date  .  anal fistulostomy     w/marsupialization an excision of anal tags  . BACK SURGERY  10/2017  . BREAST LUMPECTOMY Right    benign  . CARPAL TUNNEL WITH CUBITAL TUNNEL Right 02/06/2018   Procedure: CARPAL TUNNEL RELEASE RIGHT TENOSYNOVECTOMY;  Surgeon: Daryll Brod, MD;  Location: [facility];  Service: Orthopedics;  Laterality: Right;  regional block  . COLONOSCOPY    . ENDOMETRIAL ABLATION    . ESOPHAGOGASTRODUODENOSCOPY    . GASTRIC BYPASS     mini gastric bypass in [place] reportedly  . ROTATOR CUFF REPAIR  07/2017  . ULNAR NERVE TRANSPOSITION Right 02/06/2018   Procedure: ULNAR NERVE DECOMPRESSION;  Surgeon: Daryll Brod, MD;  Location: [facility];  Service: Orthopedics;  Laterality: Right;  regional block   . ULNAR TUNNEL RELEASE Right 02/06/2018   Procedure: CUBITAL TUNNEL RELEASE;  Surgeon: Daryll Brod, MD;  [facility];  Service: Orthopedics;  Laterality: Right;  regoinal block    There were no vitals filed for this visit.  Subjective Assessment - 11/01/18 1731    Subjective  Pt stated she had a busy day at work, walked  over 14,000 steps with work today.  Reports she continues to have difficulty laying down at night, having to take more medication to get to sleep at night.      Pertinent History  past rotator cuff repair , carpal tunnel, back surgery    Patient Stated Goals  to have less back pain     Currently in Pain?  Yes    Pain Score  3     Pain Location  Back    Pain Orientation  Lower    Pain Descriptors / Indicators  Sore    Pain Type  Chronic pain    Pain Radiating Towards  Lt LE numbness down to knee lateral    Pain Onset  More than a month ago    Pain Frequency  Intermittent    Aggravating Factors   vaccum, mopping    Pain Relieving Factors  heat and motric    Effect of Pain on Daily Activities  limits         [facility] PT Assessment - 11/01/18 0001      Assessment   Medical Diagnosis  Spondylolisthesisi     Referring Provider (PT)  Nena Polio Nunkumar    Onset Date/Surgical Date  04/14/18    Next MD Visit  11/22/2017    Prior Therapy  none      Precautions   Precautions  None                   [facility] Adult PT Treatment/Exercise - 11/01/18 0001      Lumbar Exercises: Stretches   Active Hamstring Stretch  Right;Left;2 reps;30 seconds    Active Hamstring Stretch Limitations  long sitting    Quadruped Mid Back Stretch  2 reps;30 seconds    Quadruped Mid Back Stretch Limitations  child's pose      Lumbar Exercises: Standing   Functional Squats  10 reps    Functional Squats Limitations  chair taps    Forward Lunge  10 reps    Forward Lunge Limitations  cueing for mechanics    Other Standing Lumbar Exercises  SLS Lt 19", Rt 35" max      Manual Therapy   Manual Therapy  Soft tissue mobilization    Manual therapy comments  Completed separately from therapeutic exercise    Soft tissue mobilization  Prone: gluteal mm Lt>Rt               PT Short Term Goals - 10/12/18 1222      PT SHORT TERM GOAL #1   Title  PT to be able to walk for five minutes without having increased numbness down her left leg.     Baseline  Increased numbness after 45 seconds     Time  3    Period  Weeks    Status  New    Target Date  11/02/18      PT SHORT TERM GOAL #2   Title  PT to be able to demonstrate proper body mechanics for mopping, lifting and bed mobilty to decrease nerve irritation.     Time  3    Period  Weeks    Status  New        PT Long Term Goals - 10/12/18 1224      PT LONG TERM GOAL #1   Title  PT to be able to walk for 15 minutes without numbness in her left leg.     Time  6    Period  Weeks    Status  New    Target Date  11/23/18      PT LONG TERM GOAL #2   Title  PT back pain to be no greater than a 3/10 to allow pt to walk for 40 minutes in comfort for shopping and recreational activity.     Time  6    Period  Weeks    Status  New      PT LONG TERM GOAL #3    Title  PT to no longer be waking at night due to back pain.     Time  6    Period  Weeks    Status  New      PT LONG TERM GOAL #4   Title  Pt  B LE strength to be at least 4+/5 to allow pt to go up and down a flight of steps with ease in a reciprocal manner for work duties.     Time  6    Period  Weeks    Status  New      PT LONG TERM GOAL #5   Title  PT to be able to mop and vaccum without having increased back pain     Time  6    Period  Weeks            Plan - 11/01/18 1744    Clinical Impression Statement  Added lunges for lumbar stability wiht min cueing for form and mechanics.  Hamstring stretch completed in long sitting as pt c/o pain while laying supine or prone.  EOS with manual soft tissue mobilization to address restricitons, noted significant tightness Lt glut med over Rt.  EOS pt reports increased soreness.      Rehab Potential  Good    PT Frequency  2x / week    PT Duration  6 weeks    PT Treatment/Interventions  ADLs/Self Care Home Management;Therapeutic activities;Therapeutic exercise;Balance training;Dry needling;Manual techniques    PT Next Visit Plan  Progress with lumbar stabilization exercises, continue wiht hamstring stretching and manual if needed for pain    PT Home Exercise Plan  pelvic tilt/ ab set ; 10/19/2018: bent knee raise with ab set; 11/01/18: hamstring stretch       Patient will benefit from skilled therapeutic intervention in order to improve the following deficits and impairments:  Decreased activity tolerance, Decreased balance, Decreased range of motion, Decreased strength, Increased muscle spasms, Postural dysfunction, Improper body mechanics, Pain  Visit Diagnosis: Radiculopathy, lumbar region  Muscle weakness (generalized)  Other symptoms and signs involving the musculoskeletal system     Problem List Patient Active Problem List   Diagnosis Date Noted  . Right carpal tunnel syndrome 01/09/2018  . Ulnar neuropathy at elbow,  right 01/09/2018  . Elevated transaminase level 09/21/2015  . Vitamin D deficiency 09/21/2015  . History of gastric bypass 09/21/2015  . Anemia, iron deficiency 09/21/2015  . Hypothyroidism 05/20/2014  . Rheumatoid arthritis ([place]) 07/11/2013  . Constipation 01/19/2011  . Abdominal discomfort in left lower quadrant 01/19/2011  . GERD 01/01/2010  . HIATAL HERNIA 01/01/2010  . ANAL FISTULA 01/01/2010  . ABDOMINAL PAIN, UNSPECIFIED SITE 01/01/2010   Ihor Austin, [place]; [place]  Aldona Lento 11/01/2018, [place] [address] [place], [place], [address] Phone: [phone]   Fax:  [phone]  Name: [name] MRN: [id number] Date of Birth: 05-08-1961

## 2018-11-07 ENCOUNTER — Ambulatory Visit (HOSPITAL_COMMUNITY): Payer: 59 | Admitting: Physical Therapy

## 2018-11-07 ENCOUNTER — Encounter (HOSPITAL_COMMUNITY): Payer: Self-pay | Admitting: Physical Therapy

## 2018-11-07 DIAGNOSIS — M5416 Radiculopathy, lumbar region: Secondary | ICD-10-CM

## 2018-11-07 DIAGNOSIS — M6281 Muscle weakness (generalized): Secondary | ICD-10-CM

## 2018-11-07 DIAGNOSIS — R29898 Other symptoms and signs involving the musculoskeletal system: Secondary | ICD-10-CM

## 2018-11-07 NOTE — Therapy (Signed)
[place], [place], [address] Phone: [phone]   Fax:  [phone]  Physical Therapy Treatment  Patient Details  Name: [name] MRN: [id number] Date of Birth: June 23, 1961 Referring Provider (PT): Sharee Holster   Encounter Date: 11/07/2018  PT End of Session - 11/07/18 1656    Visit Number  6    Number of Visits  12    Date for PT Re-Evaluation  11/23/18    Authorization Type  UHC    Authorization Time Period  Cert: 63/87-->02/20/4331    Authorization - Visit Number  7    Authorization - Number of Visits  60    PT Start Time  9518    PT Stop Time  1728    PT Time Calculation (min)  38 min    Activity Tolerance  Patient tolerated treatment well    Behavior During Therapy  [facility] for tasks assessed/performed       Past Medical History:  Diagnosis Date  . Abdominal pain of unknown etiology   . Anal fistula    hx of   . Aortic atherosclerosis ([place])   . Constipation    severe, history of  . DDD (degenerative disc disease), lumbar   . Depression   . Diarrhea    history of  . GERD (gastroesophageal reflux disease)   . History of hiatal hernia   . Hypothyroidism   . Iron deficiency anemia   . Left shoulder pain   . Low back pain   . Lumbar herniated disc   . Neck pain   . Numbness of right lower extremity   . Olecranon bursitis   . Plantar fasciitis, left   . Prediabetes   . RA (rheumatoid arthritis) ([place])   . Right carpal tunnel syndrome 01/09/2018  . S/P colonoscopy April 2011   Dr. Olevia Perches: mild diverticulosis, hyperplastic polyps, repeat in 10 years  . S/P endoscopy April 2011   Dr. Olevia Perches: no hiatal hernia, generous opening to antrum., patent gastrojejunostomy  . Sciatic leg pain    Right  . Ulnar neuropathy at elbow, right 01/09/2018  . Vitamin D deficiency   . Weakness of right lower extremity     Past Surgical History:  Procedure Laterality Date  . anal fistulostomy     w/marsupialization an excision of anal tags  . BACK SURGERY  10/2017  . BREAST LUMPECTOMY Right    benign  . CARPAL TUNNEL WITH CUBITAL TUNNEL Right 02/06/2018   Procedure: CARPAL TUNNEL RELEASE RIGHT TENOSYNOVECTOMY;  Surgeon: Daryll Brod, MD;  Location: [facility];  Service: Orthopedics;  Laterality: Right;  regional block  . COLONOSCOPY    . ENDOMETRIAL ABLATION    . ESOPHAGOGASTRODUODENOSCOPY    . GASTRIC BYPASS     mini gastric bypass in [place] reportedly  . ROTATOR CUFF REPAIR  07/2017  . ULNAR NERVE TRANSPOSITION Right 02/06/2018   Procedure: ULNAR NERVE DECOMPRESSION;  Surgeon: Daryll Brod, MD;  Location: [facility];  Service: Orthopedics;  Laterality: Right;  regional block   . ULNAR TUNNEL RELEASE Right 02/06/2018   Procedure: CUBITAL TUNNEL RELEASE;  Surgeon: Daryll Brod, MD;  Location: [facility];  Service: Orthopedics;  Laterality: Right;  regoinal block    There were no vitals filed for this visit.  Subjective Assessment - 11/07/18 1654    Subjective  Patient reported that her back is bothering her today. She rated the pain as a 5/10. Patient  stated that she is able to do her exercises at home.     Pertinent History  past rotator cuff repair , carpal tunnel, back surgery    Patient Stated Goals  to have less back pain     Currently in Pain?  Yes    Pain Score  5     Pain Location  Back    Pain Orientation  Left;Lower    Pain Descriptors / Indicators  Aching    Pain Type  Chronic pain    Pain Onset  More than a month ago                       [facility] Adult PT Treatment/Exercise - 11/07/18 0001      Lumbar Exercises: Stretches   Active Hamstring Stretch  Right;Left;30 seconds;3 reps    Active Hamstring Stretch Limitations  Supine with a belt    Quadruped Mid Back Stretch  2 reps;30 seconds    Quadruped Mid Back Stretch Limitations  child's pose      Lumbar Exercises: Standing   Functional Squats  10  reps    Functional Squats Limitations  chair taps    Forward Lunge  10 reps    Forward Lunge Limitations  cueing for mechanics    Other Standing Lumbar Exercises  Pallof press: red theraband tandem stance x 20 each LE      Lumbar Exercises: Supine   Bent Knee Raise  20 reps    Bent Knee Raise Limitations  Alternating legs 3'' holds      Manual Therapy   Manual Therapy  Soft tissue mobilization    Manual therapy comments  Completed separately from therapeutic exercise    Soft tissue mobilization  Prone with bolster under ankles: gluteal mm Lt>Rt IASTM with red weighted ball             PT Education - 11/07/18 1656    Education Details  Discussed technique and purpose of interventions throughout session.     Person(s) Educated  Patient    Methods  Explanation    Comprehension  Verbalized understanding       PT Short Term Goals - 10/12/18 1222      PT SHORT TERM GOAL #1   Title  PT to be able to walk for five minutes without having increased numbness down her left leg.     Baseline  Increased numbness after 45 seconds     Time  3    Period  Weeks    Status  New    Target Date  11/02/18      PT SHORT TERM GOAL #2   Title  PT to be able to demonstrate proper body mechanics for mopping, lifting and bed mobilty to decrease nerve irritation.     Time  3    Period  Weeks    Status  New        PT Long Term Goals - 10/12/18 1224      PT LONG TERM GOAL #1   Title  PT to be able to walk for 15 minutes without numbness in her left leg.     Time  6    Period  Weeks    Status  New    Target Date  11/23/18      PT LONG TERM GOAL #2   Title  PT back pain to be no greater than a 3/10 to allow pt to walk for 40 minutes  in comfort for shopping and recreational activity.     Time  6    Period  Weeks    Status  New      PT LONG TERM GOAL #3   Title  PT to no longer be waking at night due to back pain.     Time  6    Period  Weeks    Status  New      PT LONG TERM GOAL  #4   Title  Pt  B LE strength to be at least 4+/5 to allow pt to go up and down a flight of steps with ease in a reciprocal manner for work duties.     Time  6    Period  Weeks    Status  New      PT LONG TERM GOAL #5   Title  PT to be able to mop and vaccum without having increased back pain     Time  6    Period  Weeks            Plan - 11/07/18 1734    Clinical Impression Statement  This session continued with established plan of care. Added Pallof press and supine marching for core and lumbar stabilization. Ended session with soft tissue mobilization using a weighted ball. Patient reported a reduction in her pain symptoms to 0/10 from 5/10 at the beginning of the session.     Rehab Potential  Good    PT Frequency  2x / week    PT Duration  6 weeks    PT Treatment/Interventions  ADLs/Self Care Home Management;Therapeutic activities;Therapeutic exercise;Balance training;Dry needling;Manual techniques    PT Next Visit Plan  Progress with lumbar stabilization exercises, continue wiht hamstring stretching and manual if needed for pain    PT Home Exercise Plan  pelvic tilt/ ab set ; 10/19/2018: bent knee raise with ab set; 11/01/18: hamstring stretch       Patient will benefit from skilled therapeutic intervention in order to improve the following deficits and impairments:  Decreased activity tolerance, Decreased balance, Decreased range of motion, Decreased strength, Increased muscle spasms, Postural dysfunction, Improper body mechanics, Pain  Visit Diagnosis: Radiculopathy, lumbar region  Muscle weakness (generalized)  Other symptoms and signs involving the musculoskeletal system     Problem List Patient Active Problem List   Diagnosis Date Noted  . Right carpal tunnel syndrome 01/09/2018  . Ulnar neuropathy at elbow, right 01/09/2018  . Elevated transaminase level 09/21/2015  . Vitamin D deficiency 09/21/2015  . History of gastric bypass 09/21/2015  . Anemia, iron  deficiency 09/21/2015  . Hypothyroidism 05/20/2014  . Rheumatoid arthritis ([place]) 07/11/2013  . Constipation 01/19/2011  . Abdominal discomfort in left lower quadrant 01/19/2011  . GERD 01/01/2010  . HIATAL HERNIA 01/01/2010  . ANAL FISTULA 01/01/2010  . ABDOMINAL PAIN, UNSPECIFIED SITE 01/01/2010   Clarene Critchley PT, DPT 5:36 PM, 11/07/18 [place], [place], [address] Phone: [phone]   Fax:  [phone]  Name: [name] MRN: [id number] Date of Birth: 1961-06-29

## 2018-11-09 ENCOUNTER — Ambulatory Visit (HOSPITAL_COMMUNITY): Payer: 59 | Admitting: Physical Therapy

## 2018-11-09 ENCOUNTER — Telehealth (HOSPITAL_COMMUNITY): Payer: Self-pay | Admitting: Physical Therapy

## 2018-11-09 NOTE — Telephone Encounter (Signed)
her back is hurting and she did not go to work today, she is still hurting and will not be here today

## 2018-11-14 ENCOUNTER — Telehealth (HOSPITAL_COMMUNITY): Payer: Self-pay

## 2018-11-14 ENCOUNTER — Ambulatory Visit (HOSPITAL_COMMUNITY): Payer: 59

## 2018-11-14 NOTE — Telephone Encounter (Signed)
No show, called and left message concerning missed apt.  Reminded next apt date and time with contact information given.  [address] [place]; [employer] [phone]

## 2018-11-16 ENCOUNTER — Ambulatory Visit (HOSPITAL_COMMUNITY): Payer: 59 | Admitting: Physical Therapy

## 2018-11-16 ENCOUNTER — Telehealth (HOSPITAL_COMMUNITY): Payer: Self-pay | Admitting: Physical Therapy

## 2018-11-16 NOTE — Telephone Encounter (Signed)
Therapist called regarding patient not showing up for appointment scheduled at 4:45 this afternoon. Therapist stated that she hoped patient was doing well. She reminded the patient of her next scheduled appointment and attendance policy and that patient would be discharged from physical therapy at next no-show visit. Therapist provided clinic phone number.  Clarene Critchley PT, DPT 5:10 PM, 11/16/18 [phone]

## 2018-11-21 ENCOUNTER — Ambulatory Visit (HOSPITAL_COMMUNITY): Payer: 59

## 2018-11-23 ENCOUNTER — Ambulatory Visit (HOSPITAL_COMMUNITY): Payer: 59 | Attending: Neurosurgery | Admitting: Physical Therapy

## 2018-11-23 ENCOUNTER — Telehealth (HOSPITAL_COMMUNITY): Payer: Self-pay | Admitting: Physical Therapy

## 2018-11-23 ENCOUNTER — Encounter (HOSPITAL_COMMUNITY): Payer: Self-pay | Admitting: Physical Therapy

## 2018-11-23 NOTE — Therapy (Signed)
[place], [place], [address] Phone: [phone]   Fax:  [phone]  Patient Details  Name: [name] MRN: [id number] Date of Birth: 1961/07/31 Referring Provider:  No ref. provider found  Encounter Date: 11/23/2018  PHYSICAL THERAPY DISCHARGE SUMMARY  Visits from Start of Care: 6  Current functional level related to goals / functional outcomes: Unable to assess as patient did not return for re-assessment.    Remaining deficits: Unable to assess as patient did not return for re-assessment.    Education / Equipment: HEP and benefits of physical therapy.  Plan: Patient agrees to discharge.  Patient goals were not met. Patient is being discharged due to                                                     ?????     Per company attendance policy             Clarene Critchley PT, DPT 5:13 PM, 11/23/18 [place], [place], [address] Phone: [phone]   Fax:  [phone]

## 2018-11-23 NOTE — Telephone Encounter (Signed)
Therapist called regarding patient not showing up for appointment at 4:45 this evening. Explained that as this was patient's 3rd no show she would be discharged from physical therapy per company policy. Explained that patient would need to get another referral from her physician if she wished to have physical therapy in the future. Patient stated her understanding.  Clarene Critchley PT, DPT 5:09 PM, 11/23/18 [phone]

## 2018-11-27 ENCOUNTER — Encounter (HOSPITAL_COMMUNITY): Payer: 59

## 2018-11-27 DIAGNOSIS — M0579 Rheumatoid arthritis with rheumatoid factor of multiple sites without organ or systems involvement: Secondary | ICD-10-CM | POA: Diagnosis not present

## 2018-11-27 DIAGNOSIS — M1990 Unspecified osteoarthritis, unspecified site: Secondary | ICD-10-CM | POA: Diagnosis not present

## 2018-11-27 DIAGNOSIS — Z79899 Other long term (current) drug therapy: Secondary | ICD-10-CM | POA: Diagnosis not present

## 2018-12-03 DIAGNOSIS — M4317 Spondylolisthesis, lumbosacral region: Secondary | ICD-10-CM | POA: Diagnosis not present

## 2018-12-05 ENCOUNTER — Other Ambulatory Visit: Payer: Self-pay | Admitting: Physician Assistant

## 2018-12-05 DIAGNOSIS — M4317 Spondylolisthesis, lumbosacral region: Secondary | ICD-10-CM

## 2018-12-09 ENCOUNTER — Other Ambulatory Visit: Payer: Self-pay | Admitting: Family Medicine

## 2018-12-11 ENCOUNTER — Ambulatory Visit
Admission: RE | Admit: 2018-12-11 | Discharge: 2018-12-11 | Disposition: A | Discharge status: Home / Self Care | Payer: 59 | Source: Ambulatory Visit | Attending: Physician Assistant | Admitting: Physician Assistant

## 2018-12-11 DIAGNOSIS — M4317 Spondylolisthesis, lumbosacral region: Secondary | ICD-10-CM

## 2018-12-11 DIAGNOSIS — M47817 Spondylosis without myelopathy or radiculopathy, lumbosacral region: Secondary | ICD-10-CM | POA: Diagnosis not present

## 2018-12-11 MED ORDER — METHYLPREDNISOLONE ACETATE 40 MG/ML INJ SUSP (RADIOLOG
120.0000 mg | Freq: Once | INTRAMUSCULAR | Status: AC
  Administered 2018-12-11: 120 mg via INTRA_ARTICULAR

## 2018-12-11 MED ORDER — IOPAMIDOL (ISOVUE-M 200) INJECTION 41%
1.0000 mL | Freq: Once | INTRAMUSCULAR | Status: AC
  Administered 2018-12-11: 1 mL via INTRA_ARTICULAR

## 2018-12-11 NOTE — Telephone Encounter (Signed)
Pt is calling checking status of refill. She is out of medication.

## 2018-12-11 NOTE — Discharge Instructions (Signed)

## 2018-12-12 NOTE — Telephone Encounter (Signed)
May have this +2 refills 

## 2018-12-12 NOTE — Telephone Encounter (Signed)
Duplicate

## 2018-12-25 DIAGNOSIS — M0579 Rheumatoid arthritis with rheumatoid factor of multiple sites without organ or systems involvement: Secondary | ICD-10-CM | POA: Diagnosis not present

## 2018-12-25 DIAGNOSIS — Z79899 Other long term (current) drug therapy: Secondary | ICD-10-CM | POA: Diagnosis not present

## 2019-01-08 ENCOUNTER — Other Ambulatory Visit: Payer: Self-pay | Admitting: Family Medicine

## 2019-02-15 ENCOUNTER — Other Ambulatory Visit: Payer: Self-pay | Admitting: Family Medicine

## 2019-03-01 ENCOUNTER — Ambulatory Visit (INDEPENDENT_AMBULATORY_CARE_PROVIDER_SITE_OTHER): Payer: 59 | Admitting: Family Medicine

## 2019-03-01 ENCOUNTER — Encounter: Payer: Self-pay | Admitting: Family Medicine

## 2019-03-01 ENCOUNTER — Other Ambulatory Visit: Payer: Self-pay

## 2019-03-01 DIAGNOSIS — R21 Rash and other nonspecific skin eruption: Secondary | ICD-10-CM | POA: Diagnosis not present

## 2019-03-01 MED ORDER — TRIAMCINOLONE ACETONIDE 0.1 % EX CREA: 1.0000 "application " | TOPICAL_CREAM | Freq: Two times a day (BID) | CUTANEOUS | 0 refills | Status: DC

## 2019-03-01 MED ORDER — PREDNISONE 20 MG PO TABS: ORAL_TABLET | ORAL | 0 refills | Status: DC

## 2019-03-01 NOTE — Progress Notes (Signed)
   Subjective:    Patient ID: JAYLEN CLAUDE, female    DOB: 1960/11/12, 58 y.o.   MRN: [id number] Audio plus video Rash  This is a new problem. Episode onset: 5 days. Location: left arm. Associated with: poison oak when cutting down trees. Treatments tried: calamine, benadryl gel.   Virtual Visit via Video Note  I connected with Mabeline Caras on 03/01/19 at  3:50 PM EDT by a video enabled telemedicine application and verified that I am speaking with the correct person using two identifiers.  Location: Patient: home Provider: office   I discussed the limitations of evaluation and management by telemedicine and the availability of in person appointments. The patient expressed understanding and agreed to proceed.  History of Present Illness:    Observations/Objective:   Assessment and Plan:   Follow Up Instructions:    I discussed the assessment and treatment plan with the patient. The patient was provided an opportunity to ask questions and all were answered. The patient agreed with the plan and demonstrated an understanding of the instructions.   The patient was advised to call back or seek an in-person evaluation if the symptoms worsen or if the condition fails to improve as anticipated.  I provided 15 minutes of non-face-to-face time during this encounter.  Patient's rash very pruritic at times.  Weeping particularly at the ankles.  Not particularly responsive to over-the-counter topical agents.    Review of Systems  Skin: Positive for rash.       Objective:   Physical Exam Skin reveals impressive rash legs arms ankles      Assessment & Plan:  Impression Poison oak dermatitis.  Local measures discussed.  Warning signs discussed with steroid taper rationale discussed

## 2019-03-01 NOTE — Addendum Note (Signed)
Addended by: Dairl Ponder on: 03/01/2019 05:14 PM   Modules accepted: Orders

## 2019-03-04 ENCOUNTER — Other Ambulatory Visit (HOSPITAL_COMMUNITY): Payer: Self-pay | Admitting: Family Medicine

## 2019-04-03 ENCOUNTER — Other Ambulatory Visit: Payer: Self-pay | Admitting: Family Medicine

## 2019-04-03 NOTE — Telephone Encounter (Signed)
May have this +1 refill needs to schedule virtual follow-up visit

## 2019-05-04 ENCOUNTER — Emergency Department (HOSPITAL_COMMUNITY): Payer: 59

## 2019-05-04 ENCOUNTER — Emergency Department (HOSPITAL_COMMUNITY)
Admission: EM | Admit: 2019-05-04 | Discharge: 2019-05-04 | Disposition: A | Discharge status: Home / Self Care | Payer: 59 | Attending: Emergency Medicine | Admitting: Emergency Medicine

## 2019-05-04 ENCOUNTER — Other Ambulatory Visit: Payer: Self-pay

## 2019-05-04 ENCOUNTER — Encounter (HOSPITAL_COMMUNITY): Payer: Self-pay | Admitting: Emergency Medicine

## 2019-05-04 DIAGNOSIS — S59911A Unspecified injury of right forearm, initial encounter: Secondary | ICD-10-CM | POA: Diagnosis present

## 2019-05-04 DIAGNOSIS — S52501A Unspecified fracture of the lower end of right radius, initial encounter for closed fracture: Secondary | ICD-10-CM

## 2019-05-04 DIAGNOSIS — T148XXA Other injury of unspecified body region, initial encounter: Secondary | ICD-10-CM

## 2019-05-04 DIAGNOSIS — Y929 Unspecified place or not applicable: Secondary | ICD-10-CM | POA: Insufficient documentation

## 2019-05-04 DIAGNOSIS — Y939 Activity, unspecified: Secondary | ICD-10-CM | POA: Diagnosis not present

## 2019-05-04 DIAGNOSIS — S52591A Other fractures of lower end of right radius, initial encounter for closed fracture: Secondary | ICD-10-CM | POA: Diagnosis not present

## 2019-05-04 DIAGNOSIS — Z79899 Other long term (current) drug therapy: Secondary | ICD-10-CM | POA: Diagnosis not present

## 2019-05-04 DIAGNOSIS — Y999 Unspecified external cause status: Secondary | ICD-10-CM | POA: Insufficient documentation

## 2019-05-04 DIAGNOSIS — Z87891 Personal history of nicotine dependence: Secondary | ICD-10-CM | POA: Insufficient documentation

## 2019-05-04 MED ORDER — HYDROCODONE-ACETAMINOPHEN 5-325 MG PO TABS: 1.0000 | ORAL_TABLET | ORAL | 0 refills | Status: DC | PRN

## 2019-05-04 MED ORDER — HYDROCODONE-ACETAMINOPHEN 5-325 MG PO TABS
2.0000 | ORAL_TABLET | Freq: Once | ORAL | Status: AC
  Administered 2019-05-04: 2 via ORAL
  Filled 2019-05-04: qty 2

## 2019-05-04 NOTE — ED Provider Notes (Signed)
[facility] EMERGENCY DEPARTMENT Provider Note   CSN: [id number] Arrival date & time: 05/04/19  2006     History   Chief Complaint Chief Complaint  Patient presents with  . Arm Injury    HPI [name] is a 58 y.o. female.     Pt reports she fell out of a trailer and hit her wrist   The history is provided by the patient. No language interpreter was used.  Arm Injury Location:  Arm Arm location:  R forearm Injury: yes   Mechanism of injury: fall   Pain details:    Quality:  Aching   Radiates to:  Does not radiate   Timing:  Constant   Progression:  Worsening Prior injury to area:  No Relieved by:  Nothing   Past Medical History:  Diagnosis Date  . Abdominal pain of unknown etiology   . Anal fistula    hx of   . Aortic atherosclerosis ([place])   . Constipation    severe, history of  . DDD (degenerative disc disease), lumbar   . Depression   . Diarrhea    history of  . GERD (gastroesophageal reflux disease)   . History of hiatal hernia   . Hypothyroidism   . Iron deficiency anemia   . Left shoulder pain   . Low back pain   . Lumbar herniated disc   . Neck pain   . Numbness of right lower extremity   . Olecranon bursitis   . Plantar fasciitis, left   . Prediabetes   . RA (rheumatoid arthritis) ([place])   . Right carpal tunnel syndrome 01/09/2018  . S/P colonoscopy April 2011   Dr. Olevia Perches: mild diverticulosis, hyperplastic polyps, repeat in 10 years  . S/P endoscopy April 2011   Dr. Olevia Perches: no hiatal hernia, generous opening to antrum., patent gastrojejunostomy  . Sciatic leg pain    Right  . Ulnar neuropathy at elbow, right 01/09/2018  . Vitamin D deficiency   . Weakness of right lower extremity     Patient Active Problem List   Diagnosis Date Noted  . Right carpal tunnel syndrome 01/09/2018  . Ulnar neuropathy at elbow, right 01/09/2018  . Elevated transaminase level 09/21/2015  . Vitamin D deficiency 09/21/2015  . History of gastric bypass  09/21/2015  . Anemia, iron deficiency 09/21/2015  . Hypothyroidism 05/20/2014  . Rheumatoid arthritis ([place]) 07/11/2013  . Constipation 01/19/2011  . Abdominal discomfort in left lower quadrant 01/19/2011  . GERD 01/01/2010  . HIATAL HERNIA 01/01/2010  . ANAL FISTULA 01/01/2010  . ABDOMINAL PAIN, UNSPECIFIED SITE 01/01/2010    Past Surgical History:  Procedure Laterality Date  . anal fistulostomy     w/marsupialization an excision of anal tags  . BACK SURGERY  10/2017  . BREAST LUMPECTOMY Right    benign  . CARPAL TUNNEL WITH CUBITAL TUNNEL Right 02/06/2018   Procedure: CARPAL TUNNEL RELEASE RIGHT TENOSYNOVECTOMY;  Surgeon: Daryll Brod, MD;  Location: [place];  Service: Orthopedics;  Laterality: Right;  regional block  . COLONOSCOPY    . ENDOMETRIAL ABLATION    . ESOPHAGOGASTRODUODENOSCOPY    . GASTRIC BYPASS     mini gastric bypass in [place] reportedly  . ROTATOR CUFF REPAIR  07/2017  . ULNAR NERVE TRANSPOSITION Right 02/06/2018   Procedure: ULNAR NERVE DECOMPRESSION;  Surgeon: Daryll Brod, MD;  Location: [facility];  Service: Orthopedics;  Laterality: Right;  regional block   . ULNAR TUNNEL RELEASE Right 02/06/2018  Procedure: CUBITAL TUNNEL RELEASE;  Surgeon: Daryll Brod, MD;  Location: [facility];  Service: Orthopedics;  Laterality: Right;  regoinal block     OB History   No obstetric history on file.      Home Medications    Prior to Admission medications   Medication Sig Start Date End Date Taking? Authorizing Provider  calcium carbonate 1250 MG capsule Take 1,250 mg by mouth daily.    [provider]  Cholecalciferol (VITAMIN D3) 1000 UNITS tablet Take 1,000 Units by mouth daily.     [provider]  DULoxetine (CYMBALTA) 30 MG capsule TAKE ONE CAPSULE BY MOUTH EVERY DAY 03/04/19   Kathyrn Drown, MD  Estradiol-Norethindrone Acet (ACTIVELLA PO) Take 0.5 mg by mouth daily.    [provider]  folic acid (FOLVITE) 1 MG tablet Take 1 mg by mouth daily. 09/01/15   [provider]  levothyroxine (SYNTHROID) 88 MCG tablet TAKE 1 TABLET BY MOUTH EVERY DAY 04/03/19   Kathyrn Drown, MD  meloxicam (MOBIC) 15 MG tablet TAKE 1 TABLET(15 MG) BY MOUTH DAILY 02/18/19   Kathyrn Drown, MD  methotrexate (RHEUMATREX) 2.5 MG tablet Take 3 tablets Monday, 3 tablets on Tuesday her rheumatologist 08/30/15   [provider]  pantoprazole (PROTONIX) 40 MG tablet Take 1 tablet (40 mg total) by mouth daily. 08/03/18   Kathyrn Drown, MD  predniSONE (DELTASONE) 20 MG tablet 3qd for 3d then 2qd for 3d then 1qd for 3d 03/01/19   Mikey Kirschner, MD  triamcinolone cream (KENALOG) 0.1 % Apply 1 application topically 2 (two) times daily. Affected area 03/01/19   Mikey Kirschner, MD    Family History Family History  Problem Relation Age of Onset  . COPD Mother        living  . Heart disease Father        deceased  . Colon cancer Neg Hx     Social History Social History   Tobacco Use  . Smoking status: Former Research scientist (life sciences)  . Smokeless tobacco: Never Used  . Tobacco comment: quit 12 years ago  Substance Use Topics  . Alcohol use: Yes    Alcohol/week: 1.0 standard drinks    Types: 1 Standard drinks or equivalent per week    Comment: social drinker  . Drug use: No     Allergies   Penicillins   Review of Systems Review of Systems  Musculoskeletal: Positive for joint swelling and myalgias.  All other systems reviewed and are negative.    Physical Exam Updated Vital Signs BP 120/69 (BP Location: Right Wrist)   Pulse 65   Temp 98.2 F (36.8 C) (Oral)   Resp 16   Ht 5\' 4"  (1.626 m)   Wt 94.8 kg   SpO2 100%   BMI 35.87 kg/m   Physical Exam Vitals signs reviewed.  Musculoskeletal:        General: Tenderness present.  Skin:    General: Skin is warm.  Neurological:     General: No focal deficit present.     Mental Status: She is alert.  Psychiatric:         Mood and Affect: Mood normal.      ED Treatments / Results  Labs (all labs ordered are listed, but only abnormal results are displayed) Labs Reviewed - No data to display  EKG None  Radiology Dg Forearm Right  Result Date: 05/04/2019 CLINICAL DATA:  Fall with right arm and wrist pain. EXAM:  RIGHT FOREARM - 2 VIEW COMPARISON:  05/03/2018 FINDINGS: Examination demonstrates a minimally displaced transverse fracture through the distal radial metaphysis. There is a displaced ulnar styloid fracture. Remainder the exam is unremarkable. IMPRESSION: Minimally displaced transverse fracture of the distal radial metaphysis. Displaced ulnar styloid fracture. Electronically Signed   By: Marin Olp M.D.   On: 05/04/2019 21:24   Dg Wrist Complete Right  Result Date: 05/04/2019 CLINICAL DATA:  Fall with right forearm and wrist pain. EXAM: RIGHT WRIST - COMPLETE 3+ VIEW COMPARISON:  05/04/2019 FINDINGS: Exam demonstrates a minimally displaced transverse fracture of the distal radial metaphysis. There is a minimally displaced base of ulnar styloid fracture. Mild degenerate change of the radiocarpal joint and radial side of the carpal bones. IMPRESSION: Minimally displaced transverse fracture distal radial metaphysis. Displaced ulnar styloid fracture. Electronically Signed   By: Marin Olp M.D.   On: 05/04/2019 21:25    Procedures Procedures (including critical care time)  Medications Ordered in ED Medications - No data to display   Initial Impression / Assessment and Plan / ED Course  I have reviewed the triage vital signs and the nursing notes.  Pertinent labs & imaging results that were available during my care of the patient were reviewed by me and considered in my medical decision making (see chart for details).        MDM  Fracture distal radius and ulna.  Pt placed in a splint.  Pt advised to schedule to see Dr. Aline Brochure this week   Final Clinical Impressions(s) / ED  Diagnoses   Final diagnoses:  Closed fracture of distal end of right radius, unspecified fracture morphology, initial encounter    ED Discharge Orders         Ordered    HYDROcodone-acetaminophen (NORCO/VICODIN) 5-325 MG tablet  Every 4 hours PRN     05/04/19 2137        An After Visit Summary was printed and given to the patient.   Fransico Meadow, Hershal Coria 05/04/19 2139    Francine Graven, DO 05/05/19 1532

## 2019-05-04 NOTE — ED Triage Notes (Signed)
Pt fell coming out of her camper and caught herself with right arm, now with pain to right wrist.

## 2019-05-07 DIAGNOSIS — S52501A Unspecified fracture of the lower end of right radius, initial encounter for closed fracture: Secondary | ICD-10-CM | POA: Insufficient documentation

## 2019-06-08 ENCOUNTER — Other Ambulatory Visit: Payer: Self-pay | Admitting: Family Medicine

## 2019-06-08 DIAGNOSIS — E785 Hyperlipidemia, unspecified: Secondary | ICD-10-CM

## 2019-06-08 DIAGNOSIS — Z9884 Bariatric surgery status: Secondary | ICD-10-CM

## 2019-06-08 DIAGNOSIS — E559 Vitamin D deficiency, unspecified: Secondary | ICD-10-CM

## 2019-06-08 DIAGNOSIS — E039 Hypothyroidism, unspecified: Secondary | ICD-10-CM

## 2019-06-08 DIAGNOSIS — Z79899 Other long term (current) drug therapy: Secondary | ICD-10-CM

## 2019-06-11 NOTE — Telephone Encounter (Signed)
May have 30-day prescription needs to schedule follow-up visit in person or virtual

## 2019-06-11 NOTE — Telephone Encounter (Signed)
Pt is scheduled for virtual visit 9/2. Does she need to have lab work done before appt.

## 2019-06-13 NOTE — Telephone Encounter (Signed)
At the very least patient needs CBC, lipid, liver, metabolic 7, TSH, free T4  If the patient is interested I also recommend vitamin B12 and vitamin D  Diagnosis hypothyroidism, hyperlipidemia, gastric bypass history, vitamin D deficiency Patient may have 1 refill on medication keep follow-up visit

## 2019-06-15 LAB — VITAMIN D 25 HYDROXY (VIT D DEFICIENCY, FRACTURES): Vit D, 25-Hydroxy: 54.4 ng/mL (ref 30.0–100.0)

## 2019-06-15 LAB — CBC WITH DIFFERENTIAL/PLATELET
Basophils Absolute: 0 10*3/uL (ref 0.0–0.2)
Basos: 1 %
EOS (ABSOLUTE): 0.2 10*3/uL (ref 0.0–0.4)
Eos: 4 %
Hematocrit: 42.2 % (ref 34.0–46.6)
Hemoglobin: 13.9 g/dL (ref 11.1–15.9)
Immature Grans (Abs): 0 10*3/uL (ref 0.0–0.1)
Immature Granulocytes: 0 %
Lymphocytes Absolute: 1.1 10*3/uL (ref 0.7–3.1)
Lymphs: 24 %
MCH: 30.1 pg (ref 26.6–33.0)
MCHC: 32.9 g/dL (ref 31.5–35.7)
MCV: 91 fL (ref 79–97)
Monocytes Absolute: 0.5 10*3/uL (ref 0.1–0.9)
Monocytes: 12 %
Neutrophils Absolute: 2.7 10*3/uL (ref 1.4–7.0)
Neutrophils: 59 %
Platelets: 301 10*3/uL (ref 150–450)
RBC: 4.62 x10E6/uL (ref 3.77–5.28)
RDW: 14 % (ref 11.7–15.4)
WBC: 4.5 10*3/uL (ref 3.4–10.8)

## 2019-06-15 LAB — BASIC METABOLIC PANEL
BUN/Creatinine Ratio: 18 (ref 9–23)
BUN: 13 mg/dL (ref 6–24)
CO2: 25 mmol/L (ref 20–29)
Calcium: 9.6 mg/dL (ref 8.7–10.2)
Chloride: 99 mmol/L (ref 96–106)
Creatinine, Ser: 0.74 mg/dL (ref 0.57–1.00)
GFR calc Af Amer: 104 mL/min/{1.73_m2} (ref >59)
GFR calc non Af Amer: 90 mL/min/{1.73_m2} (ref >59)
Glucose: 89 mg/dL (ref 65–99)
Potassium: 4.7 mmol/L (ref 3.5–5.2)
Sodium: 137 mmol/L (ref 134–144)

## 2019-06-15 LAB — HEPATIC FUNCTION PANEL
ALT: 26 IU/L (ref 0–32)
AST: 32 IU/L (ref 0–40)
Albumin: 4.4 g/dL (ref 3.8–4.9)
Alkaline Phosphatase: 105 IU/L (ref 39–117)
Bilirubin Total: 0.3 mg/dL (ref 0.0–1.2)
Bilirubin, Direct: 0.13 mg/dL (ref 0.00–0.40)
Total Protein: 7 g/dL (ref 6.0–8.5)

## 2019-06-15 LAB — VITAMIN B12: Vitamin B-12: 349 pg/mL (ref 232–1245)

## 2019-06-15 LAB — LIPID PANEL
Chol/HDL Ratio: 1.7 ratio (ref 0.0–4.4)
Cholesterol, Total: 160 mg/dL (ref 100–199)
HDL: 94 mg/dL (ref >39)
LDL Calculated: 60 mg/dL (ref 0–99)
Triglycerides: 29 mg/dL (ref 0–149)
VLDL Cholesterol Cal: 6 mg/dL (ref 5–40)

## 2019-06-15 LAB — TSH: TSH: 2.26 u[IU]/mL (ref 0.450–4.500)

## 2019-06-15 LAB — T4, FREE: Free T4: 1.11 ng/dL (ref 0.82–1.77)

## 2019-06-16 ENCOUNTER — Other Ambulatory Visit: Payer: Self-pay | Admitting: Family Medicine

## 2019-06-19 ENCOUNTER — Other Ambulatory Visit: Payer: Self-pay

## 2019-06-19 ENCOUNTER — Ambulatory Visit (INDEPENDENT_AMBULATORY_CARE_PROVIDER_SITE_OTHER): Payer: 59 | Admitting: Family Medicine

## 2019-06-19 DIAGNOSIS — E038 Other specified hypothyroidism: Secondary | ICD-10-CM | POA: Diagnosis not present

## 2019-06-19 DIAGNOSIS — M069 Rheumatoid arthritis, unspecified: Secondary | ICD-10-CM | POA: Diagnosis not present

## 2019-06-19 DIAGNOSIS — K219 Gastro-esophageal reflux disease without esophagitis: Secondary | ICD-10-CM

## 2019-06-19 DIAGNOSIS — E559 Vitamin D deficiency, unspecified: Secondary | ICD-10-CM

## 2019-06-19 MED ORDER — DULOXETINE HCL 20 MG PO CPEP: 20.0000 mg | ORAL_CAPSULE | Freq: Every day | ORAL | 0 refills | Status: DC

## 2019-06-19 MED ORDER — FAMOTIDINE 40 MG PO TABS: 40.0000 mg | ORAL_TABLET | Freq: Every day | ORAL | 1 refills | Status: DC

## 2019-06-19 MED ORDER — LEVOTHYROXINE SODIUM 88 MCG PO TABS: 88.0000 ug | ORAL_TABLET | Freq: Every day | ORAL | 1 refills | Status: DC

## 2019-06-19 MED ORDER — MELOXICAM 15 MG PO TABS: ORAL_TABLET | ORAL | 1 refills | Status: DC

## 2019-06-19 MED ORDER — ESTRADIOL-NORETHINDRONE ACET 0.5-0.1 MG PO TABS: ORAL_TABLET | ORAL | 1 refills | Status: DC

## 2019-06-19 NOTE — Progress Notes (Signed)
Subjective:    Patient ID: [name], female    DOB: 03-Nov-1960, 58 y.o.   MRN: [id number]  HPI  Patient calls for a follow up in thyroid. Patient would also like to discuss recent labs. Patietn states her GYN retired and wonders if he would take over her estradiol prescription. She will be in the process of finding gynecologist we did discuss how estrogens can be appropriate at her age but by the time she is in her 57s she ought to consider being off of this  We did discuss how meloxicam does help with her arthralgias but we also discussed the risk including ulcers and also kidney dysfunction patient currently chooses to stick with the medicine  She does have rheumatoid arthritis sees specialist in [place] they recommended Humira but currently she does not want to take this because her risk of infections  She does have vitamin D deficiency she does take 50,000 units and states that she will try the over-the-counter 1000 units in the future and then do a follow-up vitamin D in 6 months  History of hypothyroidism takes her medicine regular basis states fatigue intermittently but doing well with medicine lab work was reviewed with patient in detail  Results for orders placed or performed in visit on 06/08/19  Lipid panel  Result Value Ref Range   Cholesterol, Total 160 100 - 199 mg/dL   Triglycerides 29 0 - 149 mg/dL   HDL 94 >39 mg/dL   VLDL Cholesterol Cal 6 5 - 40 mg/dL   LDL Calculated 60 0 - 99 mg/dL   Chol/HDL Ratio 1.7 0.0 - 4.4 ratio  Hepatic function panel  Result Value Ref Range   Total Protein 7.0 6.0 - 8.5 g/dL   Albumin 4.4 3.8 - 4.9 g/dL   Bilirubin Total 0.3 0.0 - 1.2 mg/dL   Bilirubin, Direct 0.13 0.00 - 0.40 mg/dL   Alkaline Phosphatase 105 39 - 117 IU/L   AST 32 0 - 40 IU/L   ALT 26 0 - 32 IU/L  Basic metabolic panel  Result Value Ref Range   Glucose 89 65 - 99 mg/dL   BUN 13 6 - 24 mg/dL   Creatinine, Ser 0.74 0.57 - 1.00 mg/dL   GFR calc non Af  Amer 90 >59 mL/min/1.73   GFR calc Af Amer 104 >59 mL/min/1.73   BUN/Creatinine Ratio 18 9 - 23   Sodium 137 134 - 144 mmol/L   Potassium 4.7 3.5 - 5.2 mmol/L   Chloride 99 96 - 106 mmol/L   CO2 25 20 - 29 mmol/L   Calcium 9.6 8.7 - 10.2 mg/dL  TSH  Result Value Ref Range   TSH 2.260 0.450 - 4.500 uIU/mL  T4, free  Result Value Ref Range   Free T4 1.11 0.82 - 1.77 ng/dL  CBC with Differential/Platelet  Result Value Ref Range   WBC 4.5 3.4 - 10.8 x10E3/uL   RBC 4.62 3.77 - 5.28 x10E6/uL   Hemoglobin 13.9 11.1 - 15.9 g/dL   Hematocrit 42.2 34.0 - 46.6 %   MCV 91 79 - 97 fL   MCH 30.1 26.6 - 33.0 pg   MCHC 32.9 31.5 - 35.7 g/dL   RDW 14.0 11.7 - 15.4 %   Platelets 301 150 - 450 x10E3/uL   Neutrophils 59 Not Estab. %   Lymphs 24 Not Estab. %   Monocytes 12 Not Estab. %   Eos 4 Not Estab. %   Basos 1 Not Estab. %  Neutrophils Absolute 2.7 1.4 - 7.0 x10E3/uL   Lymphocytes Absolute 1.1 0.7 - 3.1 x10E3/uL   Monocytes Absolute 0.5 0.1 - 0.9 x10E3/uL   EOS (ABSOLUTE) 0.2 0.0 - 0.4 x10E3/uL   Basophils Absolute 0.0 0.0 - 0.2 x10E3/uL   Immature Granulocytes 0 Not Estab. %   Immature Grans (Abs) 0.0 0.0 - 0.1 x10E3/uL  Vitamin B12  Result Value Ref Range   Vitamin B-12 349 232 - 1,245 pg/mL  VITAMIN D 25 Hydroxy (Vit-D Deficiency, Fractures)  Result Value Ref Range   Vit D, 25-Hydroxy 54.4 30.0 - 100.0 ng/mL     Patient does have reflux related issues we discussed how PPIs have increased risk of unintended side effects she would like to try famotidine she states her reflux under good control denies any dysphagia issues Virtual Visit via Video Note  I connected with [name] on 06/19/19 at  8:40 AM EDT by a video enabled telemedicine application and verified that I am speaking with the correct person using two identifiers.  Location: Patient: home Provider: office   I discussed the limitations of evaluation and management by telemedicine and the availability of in  person appointments. The patient expressed understanding and agreed to proceed.  History of Present Illness:    Observations/Objective:   Assessment and Plan:   Follow Up Instructions:    I discussed the assessment and treatment plan with the patient. The patient was provided an opportunity to ask questions and all were answered. The patient agreed with the plan and demonstrated an understanding of the instructions.   The patient was advised to call back or seek an in-person evaluation if the symptoms worsen or if the condition fails to improve as anticipated.  I provided 25 minutes of non-face-to-face time during this encounter.       Review of Systems  Constitutional: Positive for fatigue. Negative for activity change, appetite change and fever.  HENT: Negative for congestion and rhinorrhea.   Respiratory: Negative for cough and shortness of breath.   Cardiovascular: Negative for chest pain and leg swelling.  Gastrointestinal: Negative for abdominal pain and diarrhea.  Endocrine: Negative for polydipsia and polyphagia.  Musculoskeletal: Positive for arthralgias and back pain.  Skin: Negative for color change.  Neurological: Negative for dizziness and weakness.  Psychiatric/Behavioral: Negative for behavioral problems and confusion.       Objective:   Physical Exam Patient had virtual visit Appears to be in no distress Atraumatic Neuro able to relate and oriented No apparent resp distress Color normal        Assessment & Plan:  1. Other specified hypothyroidism Does have hypothyroidism lab work looks good continue current medication  2. Vitamin D deficiency She does have vitamin D deficiency but vitamin D level looks good on currently.  She is taking consistently OTC supplements  3. Rheumatoid arthritis involving multiple sites, unspecified rheumatoid factor presence ([place]) With her rheumatoid arthritis she is considering Humira but for now to methotrexate  she sees rheumatology in [place] takes meloxicam for her joint pain she will taper off of Cymbalta she does not feel she needs it any further  4. Gastroesophageal reflux disease without esophagitis We did discuss medications Pepcid is a safer choice we will switch over to that on a as needed basis  She does have menopausal symptoms is on estrogen we will continue this medicine she is in the process of finding a gynecologist to do ongoing female health checkups  She was encouraged to get a  mammogram  Follow-up 6 months  25 minutes was spent with the patient.  This statement verifies that 25 minutes was indeed spent with the patient.  More than 50% of this visit-total duration of the visit-was spent in counseling and coordination of care. The issues that the patient came in for today as reflected in the diagnosis (s) please refer to documentation for further details.

## 2019-09-10 DIAGNOSIS — R638 Other symptoms and signs concerning food and fluid intake: Secondary | ICD-10-CM | POA: Insufficient documentation

## 2019-09-15 ENCOUNTER — Other Ambulatory Visit: Payer: Self-pay | Admitting: Family Medicine

## 2019-10-21 ENCOUNTER — Other Ambulatory Visit: Payer: Self-pay | Admitting: Family Medicine

## 2019-10-21 NOTE — Telephone Encounter (Signed)
This with 2 additional refills needs follow-up in the spring

## 2019-11-20 ENCOUNTER — Other Ambulatory Visit: Payer: Self-pay

## 2019-11-20 ENCOUNTER — Ambulatory Visit (INDEPENDENT_AMBULATORY_CARE_PROVIDER_SITE_OTHER): Payer: 59 | Admitting: Family Medicine

## 2019-11-20 DIAGNOSIS — M7062 Trochanteric bursitis, left hip: Secondary | ICD-10-CM | POA: Diagnosis not present

## 2019-11-20 NOTE — Progress Notes (Signed)
   Subjective:    Patient ID: [name], female    DOB: February 02, 1961, 59 y.o.   MRN: [id number] Initially virtual visit Then the patient was brought to the office for further evaluation Hip Pain  Incident onset: ongoing. There was no injury mechanism. The pain is present in the left hip.  pt states that she has ongoing hip pain. No sure if it is hip or back. Pt has numbness on right leg and has RA. Pt not sure if left leg is related to RA or not.  Patient does have a history of low back pain she has had a previous MRI as well as injection she relates pain in the lower left hip region and some radiation into the leg. Because of the difficulty in figuring this out over the phone the patient overall states she is doing well therefore we will bring her to the office in the morning for thorough evaluation of this problem Virtual Visit via Telephone Note  I connected with Mabeline Caras on 11/20/19 at  3:00 PM EST by telephone and verified that I am speaking with the correct person using two identifiers.  Location: Patient: home Provider: office   I discussed the limitations, risks, security and privacy concerns of performing an evaluation and management service by telephone and the availability of in person appointments. I also discussed with the patient that there may be a patient responsible charge related to this service. The patient expressed understanding and agreed to proceed.   History of Present Illness:    Observations/Objective:   Assessment and Plan:   Follow Up Instructions:    I discussed the assessment and treatment plan with the patient. The patient was provided an opportunity to ask questions and all were answered. The patient agreed with the plan and demonstrated an understanding of the instructions.   The patient was advised to call back or seek an in-person evaluation if the symptoms worsen or if the condition fails to improve as anticipated.  I provided 15  minutes of non-face-to-face time during this encounter.        Review of Systems She denies any fever chills sweats denies cough wheezing difficulty breathing she does relate low back pain and hip pain denies weakness but states she is limping because of the pain    Objective:   Physical Exam        Assessment & Plan:  Patient was seen on 11/21/2019 please see that for dictation

## 2019-11-21 ENCOUNTER — Encounter: Payer: Self-pay | Admitting: Family Medicine

## 2019-11-21 ENCOUNTER — Ambulatory Visit (INDEPENDENT_AMBULATORY_CARE_PROVIDER_SITE_OTHER): Payer: 59 | Admitting: Family Medicine

## 2019-11-21 VITALS — BP 132/84 | Temp 97.6°F | Wt 210.4 lb

## 2019-11-21 DIAGNOSIS — M7062 Trochanteric bursitis, left hip: Secondary | ICD-10-CM

## 2019-11-21 MED ORDER — DICLOFENAC SODIUM 75 MG PO TBEC: 75.0000 mg | DELAYED_RELEASE_TABLET | Freq: Two times a day (BID) | ORAL | 0 refills | Status: DC

## 2019-11-21 NOTE — Progress Notes (Signed)
   Subjective:    Patient ID: [name], female    DOB: [birth year], 59 y.o.   MRN: [id number]  HPI Pt here today for left hip pain. Hip pain has been ongoing for a while. Pt had phone visit on 11/20/2019. Please see documentation from yesterday Patient with significant pain in the lower back worse on the left side also into the hip.  Patient relates pain and discomfort with walking around difficult for her to squat rotate left.  Patient has had a difficult time doing her job with this.  Has been disabled with this but still able to try to work. Patient has rheumatoid arthritis We also spent time talking about Covid and Covid protection I do recommend the vaccine for her but also recommend that she talk with her rheumatologist about whether or not she can receive the vaccine Review of Systems  Constitutional: Negative for activity change, appetite change and fatigue.  HENT: Negative for congestion and rhinorrhea.   Respiratory: Negative for cough and shortness of breath.   Cardiovascular: Negative for chest pain and leg swelling.  Gastrointestinal: Negative for abdominal pain and diarrhea.  Endocrine: Negative for polydipsia and polyphagia.  Skin: Negative for color change.  Neurological: Negative for dizziness and weakness.  Psychiatric/Behavioral: Negative for behavioral problems and confusion.       Objective:   Physical Exam  Lungs clear respiratory rate normal heart regular no murmurs low back subjective tenderness in the left lower back negative straight leg raise bilateral.  Strength in extension and flexion of the feet are normal.  Strength in the knees with extension and flexion normal Should also be noted that she has tenderness over the left hip trochanter region      Assessment & Plan:  Probable left hip bursitis referral to [facility] she may benefit from an injection.  Possibly physical therapy as well  She also has some longstanding numbness that goes down  the side of her left leg that I believe is from her back if this gets worse she may want to go back to see her neurosurgeon about this  She is to follow-up if any ongoing troubles

## 2019-11-22 ENCOUNTER — Other Ambulatory Visit: Payer: Self-pay | Admitting: *Deleted

## 2019-11-22 DIAGNOSIS — M719 Bursopathy, unspecified: Secondary | ICD-10-CM

## 2019-11-27 ENCOUNTER — Encounter: Payer: Self-pay | Admitting: Family Medicine

## 2019-12-17 ENCOUNTER — Other Ambulatory Visit: Payer: Self-pay | Admitting: Physician Assistant

## 2019-12-17 DIAGNOSIS — M4317 Spondylolisthesis, lumbosacral region: Secondary | ICD-10-CM

## 2019-12-20 ENCOUNTER — Ambulatory Visit
Admission: RE | Admit: 2019-12-20 | Discharge: 2019-12-20 | Disposition: A | Discharge status: Home / Self Care | Payer: 59 | Source: Ambulatory Visit | Attending: Physician Assistant | Admitting: Physician Assistant

## 2019-12-20 ENCOUNTER — Other Ambulatory Visit: Payer: Self-pay

## 2019-12-20 DIAGNOSIS — M4317 Spondylolisthesis, lumbosacral region: Secondary | ICD-10-CM

## 2019-12-20 MED ORDER — METHYLPREDNISOLONE ACETATE 40 MG/ML INJ SUSP (RADIOLOG
120.0000 mg | Freq: Once | INTRAMUSCULAR | Status: AC
  Administered 2019-12-20: 120 mg via INTRA_ARTICULAR

## 2019-12-20 MED ORDER — IOPAMIDOL (ISOVUE-M 200) INJECTION 41%
1.0000 mL | Freq: Once | INTRAMUSCULAR | Status: AC
  Administered 2019-12-20: 14:00:00 1 mL via INTRA_ARTICULAR

## 2019-12-20 NOTE — Discharge Instructions (Signed)

## 2019-12-25 ENCOUNTER — Other Ambulatory Visit: Payer: Self-pay | Admitting: Physician Assistant

## 2019-12-25 ENCOUNTER — Other Ambulatory Visit (HOSPITAL_COMMUNITY): Payer: Self-pay | Admitting: Physician Assistant

## 2019-12-25 DIAGNOSIS — M4317 Spondylolisthesis, lumbosacral region: Secondary | ICD-10-CM

## 2020-01-16 ENCOUNTER — Telehealth: Payer: Self-pay | Admitting: Family Medicine

## 2020-01-16 MED ORDER — LEVOTHYROXINE SODIUM 88 MCG PO TABS: 88.0000 ug | ORAL_TABLET | Freq: Every day | ORAL | 0 refills | Status: DC

## 2020-01-16 NOTE — Telephone Encounter (Signed)
Medication sent to pharmacy with note to schedule appt

## 2020-01-16 NOTE — Addendum Note (Signed)
Addended by: Vicente Males on: 01/16/2020 10:42 AM   Modules accepted: Orders

## 2020-01-16 NOTE — Telephone Encounter (Signed)
[facility] [address] requesting refill on Levothyroxine 88 mcg tablets. Take one tablet po daily. Pt last seen 11/21/19 for trochanteric bursitis. Please advise. Thank you

## 2020-01-16 NOTE — Telephone Encounter (Signed)
May have this +2 refills Patient will need to be on medication follow-up in the summer and lab work

## 2020-01-23 ENCOUNTER — Ambulatory Visit (HOSPITAL_COMMUNITY): Admission: RE | Admit: 2020-01-23 | Payer: 59 | Source: Ambulatory Visit

## 2020-01-31 DIAGNOSIS — B372 Candidiasis of skin and nail: Secondary | ICD-10-CM | POA: Insufficient documentation

## 2020-02-03 ENCOUNTER — Ambulatory Visit (HOSPITAL_COMMUNITY)
Admission: RE | Admit: 2020-02-03 | Discharge: 2020-02-03 | Disposition: A | Discharge status: Home / Self Care | Payer: 59 | Source: Ambulatory Visit | Attending: Physician Assistant | Admitting: Physician Assistant

## 2020-02-03 ENCOUNTER — Other Ambulatory Visit: Payer: Self-pay

## 2020-02-03 DIAGNOSIS — M4317 Spondylolisthesis, lumbosacral region: Secondary | ICD-10-CM | POA: Diagnosis present

## 2020-02-25 ENCOUNTER — Other Ambulatory Visit: Payer: Self-pay | Admitting: Neurosurgery

## 2020-02-27 ENCOUNTER — Other Ambulatory Visit: Payer: Self-pay | Admitting: *Deleted

## 2020-02-27 MED ORDER — MELOXICAM 15 MG PO TABS: ORAL_TABLET | ORAL | 2 refills | Status: DC

## 2020-03-12 NOTE — Progress Notes (Signed)
[facility] [phone] - [place], [place] AT [id number] [place] [address] Phone: [phone] Fax: [place] [address], [place] - [address] [place] [address] Phone: [phone] Fax: [phone]      Your procedure is scheduled on March 17, 2020.  Report to [facility] Main Entrance "A" at 05:30 A.M., and check in at the Admitting office.  Call this number if you have problems the morning of surgery:  [phone]  Call [phone] if you have any questions prior to your surgery date Monday-Friday 8am-4pm    Remember:  Do not eat or drink after midnight the night before your surgery    Take these medicines the morning of surgery with A SIP OF WATER : Levothyroxine (Synthroid)  As of today, STOP taking any Aspirin (unless otherwise instructed by your surgeon) and Aspirin containing products, Meloxicam (Mobic), Aleve, Naproxen, Ibuprofen, Motrin, Advil, Goody's, BC's, all herbal medications, fish oil, and all vitamins.                      Do not wear jewelry, make up, or nail polish            Do not wear lotions, powders, perfumes/colognes, or deodorant.            Do not shave 48 hours prior to surgery.             Do not bring valuables to the hospital.            [facility] is not responsible for any belongings or valuables.  Do NOT Smoke (Tobacco/Vapping) or drink Alcohol 24 hours prior to your procedure If you use a CPAP at night, you may bring all equipment for your overnight stay.   Contacts, glasses, dentures or bridgework may not be worn into surgery.      For patients admitted to the hospital, discharge time will be determined by your treatment team.   Patients discharged the day of surgery will not be allowed to drive home, and someone needs to stay with them for 24 hours.    Special instructions:   [place] [place]- Preparing For  Surgery  Before surgery, you can play an important role. Because skin is not sterile, your skin needs to be as free of germs as possible. You can reduce the number of germs on your skin by washing with CHG (chlorahexidine gluconate) Soap before surgery.  CHG is an antiseptic cleaner which kills germs and bonds with the skin to continue killing germs even after washing.    Oral Hygiene is also important to reduce your risk of infection.  Remember - BRUSH YOUR TEETH THE MORNING OF SURGERY WITH YOUR REGULAR TOOTHPASTE  Please do not use if you have an allergy to CHG or antibacterial soaps. If your skin becomes reddened/irritated stop using the CHG.  Do not shave (including legs and underarms) for at least 48 hours prior to first CHG shower. It is OK to shave your face.  Please follow these instructions carefully.   1. Shower the NIGHT BEFORE SURGERY and the MORNING OF SURGERY with CHG Soap.   2. If you chose to wash your hair, wash your hair first as usual with your normal shampoo.  3. After you shampoo, rinse your hair and body thoroughly to remove the shampoo.  4. Use CHG as you would any other liquid  soap. You can apply CHG directly to the skin and wash gently with a scrungie or a clean washcloth.   5. Apply the CHG Soap to your body ONLY FROM THE NECK DOWN.  Do not use on open wounds or open sores. Avoid contact with your eyes, ears, mouth and genitals (private parts). Wash Face and genitals (private parts)  with your normal soap.   6. Wash thoroughly, paying special attention to the area where your surgery will be performed.  7. Thoroughly rinse your body with warm water from the neck down.  8. DO NOT shower/wash with your normal soap after using and rinsing off the CHG Soap.  9. Pat yourself dry with a CLEAN TOWEL.  10. Wear CLEAN PAJAMAS to bed the night before surgery, wear comfortable clothes the morning of surgery  11. Place CLEAN SHEETS on your bed the night of your first  shower and DO NOT SLEEP WITH PETS.   Day of Surgery:   Do not apply any deodorants/lotions.  Please wear clean clothes to the hospital/surgery center.   Remember to brush your teeth WITH YOUR REGULAR TOOTHPASTE.   Please read over the following fact sheets that you were given.

## 2020-03-13 ENCOUNTER — Other Ambulatory Visit (HOSPITAL_COMMUNITY)
Admission: RE | Admit: 2020-03-13 | Discharge: 2020-03-13 | Disposition: A | Discharge status: Home / Self Care | Payer: 59 | Source: Ambulatory Visit | Attending: Neurosurgery | Admitting: Neurosurgery

## 2020-03-13 ENCOUNTER — Encounter (HOSPITAL_COMMUNITY)
Admission: RE | Admit: 2020-03-13 | Discharge: 2020-03-13 | Disposition: A | Discharge status: Home / Self Care | Payer: 59 | Source: Ambulatory Visit | Attending: Neurosurgery | Admitting: Neurosurgery

## 2020-03-13 ENCOUNTER — Encounter (HOSPITAL_COMMUNITY): Payer: Self-pay

## 2020-03-13 ENCOUNTER — Other Ambulatory Visit: Payer: Self-pay

## 2020-03-13 DIAGNOSIS — Z01812 Encounter for preprocedural laboratory examination: Secondary | ICD-10-CM | POA: Diagnosis not present

## 2020-03-13 DIAGNOSIS — Z20822 Contact with and (suspected) exposure to covid-19: Secondary | ICD-10-CM | POA: Diagnosis not present

## 2020-03-13 LAB — SARS CORONAVIRUS 2 (TAT 6-24 HRS): SARS Coronavirus 2: NEGATIVE

## 2020-03-13 LAB — BASIC METABOLIC PANEL
Anion gap: 9 (ref 5–15)
BUN: 11 mg/dL (ref 6–20)
CO2: 28 mmol/L (ref 22–32)
Calcium: 9.5 mg/dL (ref 8.9–10.3)
Chloride: 102 mmol/L (ref 98–111)
Creatinine, Ser: 0.83 mg/dL (ref 0.44–1.00)
GFR calc Af Amer: >60 mL/min (ref >60)
GFR calc non Af Amer: >60 mL/min (ref >60)
Glucose, Bld: 118 mg/dL — ABNORMAL HIGH (ref 70–99)
Potassium: 4 mmol/L (ref 3.5–5.1)
Sodium: 139 mmol/L (ref 135–145)

## 2020-03-13 LAB — CBC
HCT: 38.9 % (ref 36.0–46.0)
Hemoglobin: 12.5 g/dL (ref 12.0–15.0)
MCH: 29 pg (ref 26.0–34.0)
MCHC: 32.1 g/dL (ref 30.0–36.0)
MCV: 90.3 fL (ref 80.0–100.0)
Platelets: 309 10*3/uL (ref 150–400)
RBC: 4.31 MIL/uL (ref 3.87–5.11)
RDW: 15.7 % — ABNORMAL HIGH (ref 11.5–15.5)
WBC: 5.3 10*3/uL (ref 4.0–10.5)
nRBC: 0 % (ref 0.0–0.2)

## 2020-03-13 LAB — SURGICAL PCR SCREEN
MRSA, PCR: NEGATIVE
Staphylococcus aureus: POSITIVE — AB

## 2020-03-13 LAB — TYPE AND SCREEN
ABO/RH(D): A POS
Antibody Screen: NEGATIVE

## 2020-03-13 LAB — ABO/RH: ABO/RH(D): A POS

## 2020-03-13 MED ORDER — CHLORHEXIDINE GLUCONATE CLOTH 2 % EX PADS: 6.0000 | MEDICATED_PAD | Freq: Once | CUTANEOUS | Status: DC

## 2020-03-13 NOTE — Progress Notes (Signed)
PCP - S.LUKIN Cardiologist - NA    -        -         s: Aspirin Instructions:STOP  ERAS Protcol    NO- PRE-SURGERY Ensure or G2- NO  COVID TEST- FOR TODAY   Anesthesia review: NA  Patient denies shortness of breath, fever, cough and chest pain at PAT appointment   All instructions explained to the patient, with a verbal understanding of the material. Patient agrees to go over the instructions while at home for a better understanding. Patient also instructed to self quarantine after being tested for COVID-19. The opportunity to ask questions was provided.

## 2020-03-13 NOTE — H&P (Signed)
Chief Complaint   Back pain  HPI   HPI: [name] is a 59 y.o. female with several month history of left-sided lower back/ buttock pain with some numbness in the left leg and foot. She underwent an MRI of her lumbar spine which revealed disc degeneration with spondylolisthesis at L5-S1.   Unfortunately, despite conservative treatment including ESI, physical therapy and p.o. medications, she has had progressive symptoms that limit ADLs.  She presents today for lumbar decompression fusion at L5-S1.  She is without any concerns.  Of note, she has undergone prior right L5-S1 laminotomy and microdiskectomy several years ago.   Patient Active Problem List   Diagnosis Date Noted  . Right carpal tunnel syndrome 01/09/2018  . Ulnar neuropathy at elbow, right 01/09/2018  . Elevated transaminase level 09/21/2015  . Vitamin D deficiency 09/21/2015  . History of gastric bypass 09/21/2015  . Anemia, iron deficiency 09/21/2015  . Hypothyroidism 05/20/2014  . Rheumatoid arthritis ([place]) 07/11/2013  . Constipation 01/19/2011  . Abdominal discomfort in left lower quadrant 01/19/2011  . GERD 01/01/2010  . HIATAL HERNIA 01/01/2010  . ANAL FISTULA 01/01/2010  . ABDOMINAL PAIN, UNSPECIFIED SITE 01/01/2010    PMH: Past Medical History:  Diagnosis Date  . Abdominal pain of unknown etiology   . Anal fistula    hx of   . Aortic atherosclerosis ([place])   . Constipation    severe, history of  . DDD (degenerative disc disease), lumbar   . Depression   . Diarrhea    history of  . GERD (gastroesophageal reflux disease)   . History of hiatal hernia   . Hypothyroidism   . Iron deficiency anemia   . Left shoulder pain   . Low back pain   . Lumbar herniated disc   . Neck pain   . Numbness of right lower extremity   . Olecranon bursitis   . Plantar fasciitis, left   . Prediabetes   . RA (rheumatoid arthritis) ([place])   . Right carpal tunnel syndrome 01/09/2018  . S/P colonoscopy April 2011     Dr. Olevia Perches: mild diverticulosis, hyperplastic polyps, repeat in 10 years  . S/P endoscopy April 2011   Dr. Olevia Perches: no hiatal hernia, generous opening to antrum., patent gastrojejunostomy  . Sciatic leg pain    Right  . Ulnar neuropathy at elbow, right 01/09/2018  . Vitamin D deficiency   . Weakness of right lower extremity     PSH: Past Surgical History:  Procedure Laterality Date  . anal fistulostomy     w/marsupialization an excision of anal tags  . BACK SURGERY  10/2017  . BREAST LUMPECTOMY Right    benign  . CARPAL TUNNEL WITH CUBITAL TUNNEL Right 02/06/2018   Procedure: CARPAL TUNNEL RELEASE RIGHT TENOSYNOVECTOMY;  Surgeon: Daryll Brod, MD;  Location: [facility];  Service: Orthopedics;  Laterality: Right;  regional block  . COLONOSCOPY    . ENDOMETRIAL ABLATION    . ESOPHAGOGASTRODUODENOSCOPY    . GASTRIC BYPASS     mini gastric bypass in [place] reportedly  . ROTATOR CUFF REPAIR  07/2017  . ULNAR NERVE TRANSPOSITION Right 02/06/2018   Procedure: ULNAR NERVE DECOMPRESSION;  Surgeon: Daryll Brod, MD;  Location: [facility];  Service: Orthopedics;  Laterality: Right;  regional block   . ULNAR TUNNEL RELEASE Right 02/06/2018   Procedure: CUBITAL TUNNEL RELEASE;  Surgeon: Daryll Brod, MD;  Location: [facility];  Service: Orthopedics;  Laterality: Right;  regoinal  block    No medications prior to admission.    SH: Social History   Tobacco Use  . Smoking status: Former Research scientist (life sciences)  . Smokeless tobacco: Never Used  . Tobacco comment: quit 12 years ago  Substance Use Topics  . Alcohol use: Yes    Alcohol/week: 1.0 standard drinks    Types: 1 Standard drinks or equivalent per week    Comment: social drinker  . Drug use: No    MEDS: Prior to Admission medications   Medication Sig Start Date End Date Taking? Authorizing Provider  Calcium Carbonate-Vitamin D (CALCIUM 600/VITAMIN D PO) Take 1 tablet by mouth daily.   Yes  [provider]  Cholecalciferol (VITAMIN D3) 50 MCG (2000 UT) capsule Take 2,000 Units by mouth daily.    Yes [provider]  DHEA 50 MG CAPS Take 50 mg by mouth daily.   Yes [provider]  folic acid (FOLVITE) 1 MG tablet Take 1 mg by mouth daily. 09/01/15  Yes [provider]  levothyroxine (SYNTHROID) 88 MCG tablet Take 1 tablet (88 mcg total) by mouth daily. 01/16/20  Yes Kathyrn Drown, MD  meloxicam (MOBIC) 15 MG tablet Take one tablet by mouth daily Patient taking differently: Take 15 mg by mouth daily.  02/27/20  Yes Lovena Le, Malena M, DO  methotrexate (RHEUMATREX) 2.5 MG tablet Take 7.5 mg by mouth See admin instructions. Take 7.5 mg by mouth on Monday and Tuesday 08/30/15  Yes [provider]  Multiple Vitamins-Minerals (THRIVE FOR LIFE WOMENS) TABS Take 2 tablets by mouth daily.   Yes [provider]  OVER THE COUNTER MEDICATION Place 1 patch onto the skin daily. Thrive Vitamin Patch   Yes [provider]  OVER THE COUNTER MEDICATION Take 1 Scoop by mouth daily. Thrive Nutritional Supplement   Yes [provider]  vitamin B-12 (CYANOCOBALAMIN) 1000 MCG tablet Take 1,000 mcg by mouth daily.   Yes [provider]  diclofenac (VOLTAREN) 75 MG EC tablet Take 1 tablet (75 mg total) by mouth 2 (two) times daily. Patient not taking: Reported on 03/03/2020 11/21/19   Kathyrn Drown, MD    ALLERGY: Allergies  Allergen Reactions  . Penicillins Itching    Social History   Tobacco Use  . Smoking status: Former Research scientist (life sciences)  . Smokeless tobacco: Never Used  . Tobacco comment: quit 12 years ago  Substance Use Topics  . Alcohol use: Yes    Alcohol/week: 1.0 standard drinks    Types: 1 Standard drinks or equivalent per week    Comment: social drinker     Family History  Problem Relation Age of Onset  . COPD Mother        living  . Heart disease Father        deceased  . Colon cancer Neg Hx      ROS    ROS  Exam   There were no vitals filed for this visit. General appearance: WDWN, NAD Eyes: No scleral injection Cardiovascular: Regular rate and rhythm without murmurs, rubs, gallops. No edema or variciosities. Distal pulses normal. Pulmonary: Effort normal, non-labored breathing Musculoskeletal:     Muscle tone upper extremities: Normal    Muscle tone lower extremities: Normal    Motor exam: Upper Extremities Deltoid Bicep Tricep Grip  Right 5/5 5/5 5/5 5/5  Left 5/5 5/5 5/5 5/5   Lower Extremity IP Quad PF DF EHL  Right 5/5 5/5 5/5 5/5 5/5  Left 5/5 5/5 5/5 5/5 5/5  Neurological Mental Status:    - Patient is awake, alert, oriented to person, place, month, year, and situation    - Patient is able to give a clear and coherent history.    - No signs of aphasia or neglect Cranial Nerves    - II: Visual Fields are full. PERRL    - III/IV/VI: EOMI without ptosis or diploplia.     - V: Facial sensation is grossly normal    - VII: Facial movement is symmetric.     - VIII: hearing is intact to voice    - X: Uvula elevates symmetrically    - XI: Shoulder shrug is symmetric.    - XII: tongue is midline without atrophy or fasciculations.  Sensory: Sensation grossly intact to LT  Results - Imaging/Labs   No results found for this or any previous visit (from the past 48 hour(s)).  No results found.  IMAGING: MRI of the lumbar spine dated 02/03/2020 was personally reviewed.  This demonstrates maintenance of normal lumbar lordosis.  Of note, there is a well-formed S1-S2 disc space.  Primary finding is at L5-S1 where there is evidence of right-sided previous laminotomy.  There is grade 1 anterolisthesis with significant disc degenerative disease and loss of height.  There is also bilateral facet arthropathy.   Impression/Plan   59 y.o. female  With chronic primarily left-sided low back and left leg pain likely related to significant disc degenerative disease with  spondylolisthesis at L5-S1 in the setting of prior right-sided laminotomy and microdiskectomy.  Unfortunately she has had progressive symptoms limiting normal ADLs despite reasonable conservative treatment. We will proceed with surgical decompression fusion at L5-S1 including placement of intervertebral biomechanical cages, interbody arthrodesis, and posterior nonsegmental instrumentation at L5-S1 with use of BMP.  We reviewed the indications, risks, benefits, and alternatives to surgery in detail in the office. All questions today were answered and consent was obtained.

## 2020-03-17 ENCOUNTER — Encounter (HOSPITAL_COMMUNITY)
Admission: RE | Disposition: A | Discharge status: Home / Self Care | Payer: Self-pay | Source: Home / Self Care | Attending: Neurosurgery

## 2020-03-17 ENCOUNTER — Inpatient Hospital Stay (HOSPITAL_COMMUNITY): Payer: 59

## 2020-03-17 ENCOUNTER — Other Ambulatory Visit: Payer: Self-pay

## 2020-03-17 ENCOUNTER — Inpatient Hospital Stay (HOSPITAL_COMMUNITY): Payer: 59 | Admitting: Certified Registered Nurse Anesthetist

## 2020-03-17 ENCOUNTER — Inpatient Hospital Stay (HOSPITAL_COMMUNITY)
Admission: RE | Admit: 2020-03-17 | Discharge: 2020-03-18 | DRG: 455 | Disposition: A | Discharge status: Home / Self Care | Payer: 59 | Attending: Neurosurgery | Admitting: Neurosurgery

## 2020-03-17 ENCOUNTER — Encounter (HOSPITAL_COMMUNITY): Payer: Self-pay | Admitting: Neurosurgery

## 2020-03-17 DIAGNOSIS — Z79890 Hormone replacement therapy (postmenopausal): Secondary | ICD-10-CM

## 2020-03-17 DIAGNOSIS — K219 Gastro-esophageal reflux disease without esophagitis: Secondary | ICD-10-CM | POA: Diagnosis present

## 2020-03-17 DIAGNOSIS — Z880 Allergy status to penicillin: Secondary | ICD-10-CM | POA: Diagnosis not present

## 2020-03-17 DIAGNOSIS — M069 Rheumatoid arthritis, unspecified: Secondary | ICD-10-CM | POA: Diagnosis present

## 2020-03-17 DIAGNOSIS — Z9884 Bariatric surgery status: Secondary | ICD-10-CM | POA: Diagnosis not present

## 2020-03-17 DIAGNOSIS — E039 Hypothyroidism, unspecified: Secondary | ICD-10-CM | POA: Diagnosis present

## 2020-03-17 DIAGNOSIS — Z79899 Other long term (current) drug therapy: Secondary | ICD-10-CM | POA: Diagnosis not present

## 2020-03-17 DIAGNOSIS — M5116 Intervertebral disc disorders with radiculopathy, lumbar region: Secondary | ICD-10-CM | POA: Diagnosis present

## 2020-03-17 DIAGNOSIS — Z791 Long term (current) use of non-steroidal anti-inflammatories (NSAID): Secondary | ICD-10-CM

## 2020-03-17 DIAGNOSIS — M4317 Spondylolisthesis, lumbosacral region: Secondary | ICD-10-CM | POA: Diagnosis present

## 2020-03-17 DIAGNOSIS — F329 Major depressive disorder, single episode, unspecified: Secondary | ICD-10-CM | POA: Diagnosis present

## 2020-03-17 DIAGNOSIS — M5416 Radiculopathy, lumbar region: Secondary | ICD-10-CM | POA: Diagnosis present

## 2020-03-17 DIAGNOSIS — Z87891 Personal history of nicotine dependence: Secondary | ICD-10-CM | POA: Diagnosis not present

## 2020-03-17 DIAGNOSIS — Z419 Encounter for procedure for purposes other than remedying health state, unspecified: Secondary | ICD-10-CM

## 2020-03-17 SURGERY — POSTERIOR LUMBAR FUSION 1 LEVEL
Anesthesia: General | Site: Spine Lumbar

## 2020-03-17 MED ORDER — LIDOCAINE 2% (20 MG/ML) 5 ML SYRINGE
INTRAMUSCULAR | Status: DC | PRN
  Administered 2020-03-17: 60 mg via INTRAVENOUS

## 2020-03-17 MED ORDER — SUGAMMADEX SODIUM 200 MG/2ML IV SOLN
INTRAVENOUS | Status: DC | PRN
  Administered 2020-03-17: 180 mg via INTRAVENOUS

## 2020-03-17 MED ORDER — ORAL CARE MOUTH RINSE: 15.0000 mL | Freq: Once | OROMUCOSAL | Status: AC

## 2020-03-17 MED ORDER — THROMBIN 5000 UNITS EX SOLR
CUTANEOUS | Status: AC
  Filled 2020-03-17: qty 5000

## 2020-03-17 MED ORDER — LIDOCAINE-EPINEPHRINE 1 %-1:100000 IJ SOLN
INTRAMUSCULAR | Status: DC | PRN
  Administered 2020-03-17: 5 mL

## 2020-03-17 MED ORDER — MENTHOL 3 MG MT LOZG
1.0000 | LOZENGE | OROMUCOSAL | Status: DC | PRN
  Administered 2020-03-18: 3 mg via ORAL
  Filled 2020-03-17: qty 9

## 2020-03-17 MED ORDER — DHEA 50 MG PO CAPS: 50.0000 mg | ORAL_CAPSULE | Freq: Every day | ORAL | Status: DC

## 2020-03-17 MED ORDER — PHENYLEPHRINE HCL-NACL 10-0.9 MG/250ML-% IV SOLN
INTRAVENOUS | Status: DC | PRN
  Administered 2020-03-17: 25 ug/min via INTRAVENOUS

## 2020-03-17 MED ORDER — LACTATED RINGERS IV SOLN: INTRAVENOUS | Status: DC

## 2020-03-17 MED ORDER — LEVOTHYROXINE SODIUM 88 MCG PO TABS
88.0000 ug | ORAL_TABLET | Freq: Every day | ORAL | Status: DC
  Administered 2020-03-18: 88 ug via ORAL
  Filled 2020-03-17: qty 1

## 2020-03-17 MED ORDER — ROCURONIUM BROMIDE 10 MG/ML (PF) SYRINGE
PREFILLED_SYRINGE | INTRAVENOUS | Status: DC | PRN
  Administered 2020-03-17: 70 mg via INTRAVENOUS
  Administered 2020-03-17: 10 mg via INTRAVENOUS
  Administered 2020-03-17: 20 mg via INTRAVENOUS

## 2020-03-17 MED ORDER — OXYCODONE HCL 5 MG PO TABS
5.0000 mg | ORAL_TABLET | ORAL | Status: DC | PRN
  Administered 2020-03-17 – 2020-03-18 (×4): 10 mg via ORAL
  Filled 2020-03-17 (×4): qty 2

## 2020-03-17 MED ORDER — FENTANYL CITRATE (PF) 250 MCG/5ML IJ SOLN
INTRAMUSCULAR | Status: AC
  Filled 2020-03-17: qty 5

## 2020-03-17 MED ORDER — DEXAMETHASONE SODIUM PHOSPHATE 10 MG/ML IJ SOLN
INTRAMUSCULAR | Status: DC | PRN
  Administered 2020-03-17: 10 mg via INTRAVENOUS

## 2020-03-17 MED ORDER — CHLORHEXIDINE GLUCONATE 0.12 % MT SOLN
15.0000 mL | Freq: Once | OROMUCOSAL | Status: AC
  Administered 2020-03-17: 15 mL via OROMUCOSAL
  Filled 2020-03-17: qty 15

## 2020-03-17 MED ORDER — MIDAZOLAM HCL 2 MG/2ML IJ SOLN
INTRAMUSCULAR | Status: DC | PRN
  Administered 2020-03-17: 2 mg via INTRAVENOUS

## 2020-03-17 MED ORDER — SODIUM CHLORIDE 0.9 % IV SOLN
INTRAVENOUS | Status: DC | PRN
  Administered 2020-03-17: 500 mL

## 2020-03-17 MED ORDER — ONDANSETRON HCL 4 MG/2ML IJ SOLN: 4.0000 mg | Freq: Four times a day (QID) | INTRAMUSCULAR | Status: DC | PRN

## 2020-03-17 MED ORDER — FENTANYL CITRATE (PF) 250 MCG/5ML IJ SOLN
INTRAMUSCULAR | Status: DC | PRN
  Administered 2020-03-17: 125 ug via INTRAVENOUS
  Administered 2020-03-17: 50 ug via INTRAVENOUS
  Administered 2020-03-17: 25 ug via INTRAVENOUS
  Administered 2020-03-17: 50 ug via INTRAVENOUS

## 2020-03-17 MED ORDER — DEXAMETHASONE SODIUM PHOSPHATE 10 MG/ML IJ SOLN
INTRAMUSCULAR | Status: AC
  Filled 2020-03-17: qty 1

## 2020-03-17 MED ORDER — FOLIC ACID 1 MG PO TABS: 1.0000 mg | ORAL_TABLET | Freq: Every day | ORAL | Status: DC

## 2020-03-17 MED ORDER — BISACODYL 10 MG RE SUPP: 10.0000 mg | Freq: Every day | RECTAL | Status: DC | PRN

## 2020-03-17 MED ORDER — GABAPENTIN 300 MG PO CAPS
300.0000 mg | ORAL_CAPSULE | Freq: Three times a day (TID) | ORAL | Status: DC
  Administered 2020-03-17: 300 mg via ORAL
  Filled 2020-03-17 (×2): qty 1

## 2020-03-17 MED ORDER — SODIUM CHLORIDE 0.9 % IV SOLN: INTRAVENOUS | Status: DC

## 2020-03-17 MED ORDER — FLEET ENEMA 7-19 GM/118ML RE ENEM: 1.0000 | ENEMA | Freq: Once | RECTAL | Status: DC | PRN

## 2020-03-17 MED ORDER — ACETAMINOPHEN 500 MG PO TABS
1000.0000 mg | ORAL_TABLET | Freq: Four times a day (QID) | ORAL | Status: AC
  Administered 2020-03-17 – 2020-03-18 (×4): 1000 mg via ORAL
  Filled 2020-03-17 (×4): qty 2

## 2020-03-17 MED ORDER — FENTANYL CITRATE (PF) 100 MCG/2ML IJ SOLN
25.0000 ug | INTRAMUSCULAR | Status: DC | PRN
  Administered 2020-03-17 (×2): 50 ug via INTRAVENOUS

## 2020-03-17 MED ORDER — VANCOMYCIN HCL IN DEXTROSE 1-5 GM/200ML-% IV SOLN
1000.0000 mg | Freq: Once | INTRAVENOUS | Status: AC
  Administered 2020-03-17: 1000 mg via INTRAVENOUS
  Filled 2020-03-17: qty 200

## 2020-03-17 MED ORDER — ACETAMINOPHEN 325 MG PO TABS: 650.0000 mg | ORAL_TABLET | ORAL | Status: DC | PRN

## 2020-03-17 MED ORDER — METHOCARBAMOL 500 MG PO TABS
500.0000 mg | ORAL_TABLET | Freq: Four times a day (QID) | ORAL | Status: DC | PRN
  Administered 2020-03-17: 500 mg via ORAL

## 2020-03-17 MED ORDER — ONDANSETRON HCL 4 MG PO TABS: 4.0000 mg | ORAL_TABLET | Freq: Four times a day (QID) | ORAL | Status: DC | PRN

## 2020-03-17 MED ORDER — OXYCODONE HCL 5 MG/5ML PO SOLN: 5.0000 mg | Freq: Once | ORAL | Status: DC | PRN

## 2020-03-17 MED ORDER — SODIUM CHLORIDE 0.9% FLUSH
3.0000 mL | Freq: Two times a day (BID) | INTRAVENOUS | Status: DC
  Administered 2020-03-17: 3 mL via INTRAVENOUS

## 2020-03-17 MED ORDER — LACTATED RINGERS IV SOLN: INTRAVENOUS | Status: DC | PRN

## 2020-03-17 MED ORDER — LIDOCAINE-EPINEPHRINE 1 %-1:100000 IJ SOLN
INTRAMUSCULAR | Status: AC
  Filled 2020-03-17: qty 1

## 2020-03-17 MED ORDER — ONDANSETRON HCL 4 MG/2ML IJ SOLN
INTRAMUSCULAR | Status: DC | PRN
  Administered 2020-03-17: 4 mg via INTRAVENOUS

## 2020-03-17 MED ORDER — BUPIVACAINE HCL (PF) 0.5 % IJ SOLN
INTRAMUSCULAR | Status: AC
  Filled 2020-03-17: qty 30

## 2020-03-17 MED ORDER — ONDANSETRON HCL 4 MG/2ML IJ SOLN
INTRAMUSCULAR | Status: AC
  Filled 2020-03-17: qty 2

## 2020-03-17 MED ORDER — BUPIVACAINE HCL (PF) 0.5 % IJ SOLN
INTRAMUSCULAR | Status: DC | PRN
  Administered 2020-03-17: 5 mL

## 2020-03-17 MED ORDER — ACETAMINOPHEN 650 MG RE SUPP: 650.0000 mg | RECTAL | Status: DC | PRN

## 2020-03-17 MED ORDER — EPHEDRINE SULFATE 50 MG/ML IJ SOLN
INTRAMUSCULAR | Status: DC | PRN
  Administered 2020-03-17: 10 mg via INTRAVENOUS
  Administered 2020-03-17: 5 mg via INTRAVENOUS

## 2020-03-17 MED ORDER — METHOCARBAMOL 1000 MG/10ML IJ SOLN
500.0000 mg | Freq: Four times a day (QID) | INTRAVENOUS | Status: DC | PRN
  Filled 2020-03-17: qty 5

## 2020-03-17 MED ORDER — DOCUSATE SODIUM 100 MG PO CAPS
100.0000 mg | ORAL_CAPSULE | Freq: Two times a day (BID) | ORAL | Status: DC
  Administered 2020-03-17 (×2): 100 mg via ORAL
  Filled 2020-03-17 (×2): qty 1

## 2020-03-17 MED ORDER — PHENOL 1.4 % MT LIQD: 1.0000 | OROMUCOSAL | Status: DC | PRN

## 2020-03-17 MED ORDER — SODIUM CHLORIDE 0.9 % IV SOLN: 250.0000 mL | INTRAVENOUS | Status: DC

## 2020-03-17 MED ORDER — SODIUM CHLORIDE 0.9% FLUSH: 3.0000 mL | INTRAVENOUS | Status: DC | PRN

## 2020-03-17 MED ORDER — LIDOCAINE 2% (20 MG/ML) 5 ML SYRINGE
INTRAMUSCULAR | Status: AC
  Filled 2020-03-17: qty 5

## 2020-03-17 MED ORDER — METHOCARBAMOL 500 MG PO TABS
ORAL_TABLET | ORAL | Status: AC
  Filled 2020-03-17: qty 1

## 2020-03-17 MED ORDER — SENNOSIDES-DOCUSATE SODIUM 8.6-50 MG PO TABS: 1.0000 | ORAL_TABLET | Freq: Every evening | ORAL | Status: DC | PRN

## 2020-03-17 MED ORDER — MIDAZOLAM HCL 2 MG/2ML IJ SOLN
INTRAMUSCULAR | Status: AC
  Filled 2020-03-17: qty 2

## 2020-03-17 MED ORDER — FENTANYL CITRATE (PF) 100 MCG/2ML IJ SOLN
INTRAMUSCULAR | Status: AC
  Filled 2020-03-17: qty 2

## 2020-03-17 MED ORDER — HYDROCODONE-ACETAMINOPHEN 5-325 MG PO TABS: 1.0000 | ORAL_TABLET | ORAL | Status: DC | PRN

## 2020-03-17 MED ORDER — SENNA 8.6 MG PO TABS
1.0000 | ORAL_TABLET | Freq: Two times a day (BID) | ORAL | Status: DC
  Administered 2020-03-17 (×2): 8.6 mg via ORAL
  Filled 2020-03-17 (×2): qty 1

## 2020-03-17 MED ORDER — 0.9 % SODIUM CHLORIDE (POUR BTL) OPTIME
TOPICAL | Status: DC | PRN
  Administered 2020-03-17: 1000 mL

## 2020-03-17 MED ORDER — VANCOMYCIN HCL IN DEXTROSE 1-5 GM/200ML-% IV SOLN
1000.0000 mg | INTRAVENOUS | Status: AC
  Administered 2020-03-17: 1000 mg via INTRAVENOUS
  Filled 2020-03-17: qty 200

## 2020-03-17 MED ORDER — ARTIFICIAL TEARS OPHTHALMIC OINT
TOPICAL_OINTMENT | OPHTHALMIC | Status: DC | PRN
Start: 2020-03-17 — End: 2020-03-17
  Administered 2020-03-17: 1 via OPHTHALMIC

## 2020-03-17 MED ORDER — PROPOFOL 10 MG/ML IV BOLUS
INTRAVENOUS | Status: AC
  Filled 2020-03-17: qty 20

## 2020-03-17 MED ORDER — VITAMIN B-12 1000 MCG PO TABS
1000.0000 ug | ORAL_TABLET | Freq: Every day | ORAL | Status: DC
  Administered 2020-03-17: 1000 ug via ORAL
  Filled 2020-03-17: qty 1

## 2020-03-17 MED ORDER — OXYCODONE HCL 5 MG PO TABS: 5.0000 mg | ORAL_TABLET | Freq: Once | ORAL | Status: DC | PRN

## 2020-03-17 MED ORDER — HYDROMORPHONE HCL 1 MG/ML IJ SOLN
0.5000 mg | INTRAMUSCULAR | Status: DC | PRN
  Administered 2020-03-17: 1 mg via INTRAVENOUS
  Filled 2020-03-17: qty 1

## 2020-03-17 MED ORDER — PROPOFOL 10 MG/ML IV BOLUS
INTRAVENOUS | Status: DC | PRN
  Administered 2020-03-17: 110 mg via INTRAVENOUS

## 2020-03-17 MED ORDER — PHENYLEPHRINE HCL (PRESSORS) 10 MG/ML IV SOLN
INTRAVENOUS | Status: AC
  Filled 2020-03-17: qty 1

## 2020-03-17 MED ORDER — THROMBIN 5000 UNITS EX SOLR
OROMUCOSAL | Status: DC | PRN
  Administered 2020-03-17: 5 mL via TOPICAL

## 2020-03-17 SURGICAL SUPPLY — 78 items
BAG DECANTER FOR FLEXI CONT (MISCELLANEOUS) ×3 IMPLANT
BASKET BONE COLLECTION (BASKET) ×3 IMPLANT
BENZOIN TINCTURE PRP APPL 2/3 (GAUZE/BANDAGES/DRESSINGS) IMPLANT
BLADE CLIPPER SURG (BLADE) IMPLANT
BLADE SURG 11 STRL SS (BLADE) ×3 IMPLANT
BUR MATCHSTICK NEURO 3.0 LAGG (BURR) ×3 IMPLANT
BUR PRECISION FLUTE 5.0 (BURR) ×3 IMPLANT
CAGE INTER FUSION 4D LG 8 (Cage) ×2 IMPLANT
CANISTER SUCT 3000ML PPV (MISCELLANEOUS) ×3 IMPLANT
CARTRIDGE OIL MAESTRO DRILL (MISCELLANEOUS) ×1 IMPLANT
CLOSURE WOUND 1/2 X4 (GAUZE/BANDAGES/DRESSINGS)
CNTNR URN SCR LID CUP LEK RST (MISCELLANEOUS) ×1 IMPLANT
CONT SPEC 4OZ STRL OR WHT (MISCELLANEOUS) ×3
COVER BACK TABLE 60X90IN (DRAPES) ×3 IMPLANT
COVER WAND RF STERILE (DRAPES) ×1 IMPLANT
DECANTER SPIKE VIAL GLASS SM (MISCELLANEOUS) ×3 IMPLANT
DERMABOND ADVANCED (GAUZE/BANDAGES/DRESSINGS) ×2
DERMABOND ADVANCED .7 DNX12 (GAUZE/BANDAGES/DRESSINGS) ×1 IMPLANT
DIFFUSER DRILL AIR PNEUMATIC (MISCELLANEOUS) ×3 IMPLANT
DRAPE C-ARM 42X72 X-RAY (DRAPES) ×3 IMPLANT
DRAPE C-ARMOR (DRAPES) ×3 IMPLANT
DRAPE LAPAROTOMY 100X72X124 (DRAPES) ×3 IMPLANT
DRAPE SURG 17X23 STRL (DRAPES) ×3 IMPLANT
DRSG OPSITE POSTOP 4X6 (GAUZE/BANDAGES/DRESSINGS) ×2 IMPLANT
DURAPREP 26ML APPLICATOR (WOUND CARE) ×3 IMPLANT
ELECT REM PT RETURN 9FT ADLT (ELECTROSURGICAL) ×3
ELECTRODE REM PT RTRN 9FT ADLT (ELECTROSURGICAL) ×1 IMPLANT
GAUZE 4X4 16PLY RFD (DISPOSABLE) IMPLANT
GAUZE SPONGE 4X4 12PLY STRL (GAUZE/BANDAGES/DRESSINGS) IMPLANT
GLOVE BIO SURGEON STRL SZ7 (GLOVE) ×2 IMPLANT
GLOVE BIO SURGEON STRL SZ7.5 (GLOVE) ×4 IMPLANT
GLOVE BIOGEL PI IND STRL 6.5 (GLOVE) IMPLANT
GLOVE BIOGEL PI IND STRL 7.0 (GLOVE) IMPLANT
GLOVE BIOGEL PI IND STRL 7.5 (GLOVE) ×2 IMPLANT
GLOVE BIOGEL PI INDICATOR 6.5 (GLOVE) ×6
GLOVE BIOGEL PI INDICATOR 7.0 (GLOVE) ×2
GLOVE BIOGEL PI INDICATOR 7.5 (GLOVE) ×4
GLOVE ECLIPSE 7.0 STRL STRAW (GLOVE) ×6 IMPLANT
GLOVE EXAM NITRILE XL STR (GLOVE) IMPLANT
GLOVE SURG SS PI 6.0 STRL IVOR (GLOVE) ×6 IMPLANT
GOWN STRL REUS W/ TWL LRG LVL3 (GOWN DISPOSABLE) ×4 IMPLANT
GOWN STRL REUS W/ TWL XL LVL3 (GOWN DISPOSABLE) IMPLANT
GOWN STRL REUS W/TWL 2XL LVL3 (GOWN DISPOSABLE) IMPLANT
GOWN STRL REUS W/TWL LRG LVL3 (GOWN DISPOSABLE) ×12
GOWN STRL REUS W/TWL XL LVL3 (GOWN DISPOSABLE)
GRAFT DURAGEN MATRIX 1WX1L (Tissue) ×2 IMPLANT
HEMOSTAT POWDER KIT SURGIFOAM (HEMOSTASIS) ×3 IMPLANT
KIT BASIN OR (CUSTOM PROCEDURE TRAY) ×3 IMPLANT
KIT INFUSE XX SMALL 0.7CC (Orthopedic Implant) ×2 IMPLANT
KIT POSITION SURG JACKSON T1 (MISCELLANEOUS) ×3 IMPLANT
KIT TURNOVER KIT B (KITS) ×3 IMPLANT
MILL MEDIUM DISP (BLADE) ×3 IMPLANT
NDL HYPO 18GX1.5 BLUNT FILL (NEEDLE) IMPLANT
NDL SPNL 18GX3.5 QUINCKE PK (NEEDLE) IMPLANT
NEEDLE HYPO 18GX1.5 BLUNT FILL (NEEDLE) IMPLANT
NEEDLE HYPO 22GX1.5 SAFETY (NEEDLE) ×3 IMPLANT
NEEDLE SPNL 18GX3.5 QUINCKE PK (NEEDLE) IMPLANT
NS IRRIG 1000ML POUR BTL (IV SOLUTION) ×3 IMPLANT
OIL CARTRIDGE MAESTRO DRILL (MISCELLANEOUS) ×3
PACK LAMINECTOMY NEURO (CUSTOM PROCEDURE TRAY) ×3 IMPLANT
PAD ARMBOARD 7.5X6 YLW CONV (MISCELLANEOUS) ×9 IMPLANT
ROD CC 30MM (Rod) ×4 IMPLANT
SCREW 5.5X35MM (Screw) ×6 IMPLANT
SCREW BN 35X5.5XMA NS SPNE (Screw) IMPLANT
SCREW MA THRD TI 6.5X30 (Screw) ×4 IMPLANT
SCREW SET SOLERA (Screw) ×12 IMPLANT
SCREW SET SOLERA TI (Screw) IMPLANT
SPONGE LAP 4X18 RFD (DISPOSABLE) IMPLANT
SPONGE SURGIFOAM ABS GEL 100 (HEMOSTASIS) IMPLANT
STRIP CLOSURE SKIN 1/2X4 (GAUZE/BANDAGES/DRESSINGS) IMPLANT
SUT VIC AB 0 CT1 18XCR BRD8 (SUTURE) ×1 IMPLANT
SUT VIC AB 0 CT1 8-18 (SUTURE) ×6
SUT VICRYL 3-0 RB1 18 ABS (SUTURE) ×5 IMPLANT
SYR 3ML LL SCALE MARK (SYRINGE) ×9 IMPLANT
TOWEL GREEN STERILE (TOWEL DISPOSABLE) ×3 IMPLANT
TOWEL GREEN STERILE FF (TOWEL DISPOSABLE) ×3 IMPLANT
TRAY FOLEY MTR SLVR 16FR STAT (SET/KITS/TRAYS/PACK) ×3 IMPLANT
WATER STERILE IRR 1000ML POUR (IV SOLUTION) ×3 IMPLANT

## 2020-03-17 NOTE — Transfer of Care (Signed)
Immediate Anesthesia Transfer of Care Note  Patient: [name]  Procedure(s) Performed: POSTERIOR LUMBAR INTERBODY FUSION, LUMBAR FIVE- SACRAL ONE, POSTERIOR NON-SEGMENTAL INSTRUMENTATION (N/A Spine Lumbar)  Patient Location: PACU  Anesthesia Type:General  Level of Consciousness: awake and alert   Airway & Oxygen Therapy: Patient Spontanous Breathing and Patient connected to face mask oxygen  Post-op Assessment: Report given to RN and Post -op Vital signs reviewed and stable  Post vital signs: Reviewed and stable  Last Vitals:  Vitals Value Taken Time  BP 118/72 03/17/20 1059  Temp    Pulse 75 03/17/20 1100  Resp 15 03/17/20 1100  SpO2 100 % 03/17/20 1100  Vitals shown include unvalidated device data.  Last Pain:  Vitals:   03/17/20 0608  PainSc: 5          Complications: No apparent anesthesia complications

## 2020-03-17 NOTE — Anesthesia Preprocedure Evaluation (Signed)
Anesthesia Evaluation  Patient identified by MRN, date of birth, ID band Patient awake    Reviewed: Allergy & Precautions, H&P , NPO status , Patient's Chart, lab work & pertinent test results  Airway Mallampati: II   Neck ROM: full    Dental   Pulmonary former smoker,    breath sounds clear to auscultation       Cardiovascular negative cardio ROS   Rhythm:regular Rate:Normal     Neuro/Psych PSYCHIATRIC DISORDERS Depression  Neuromuscular disease    GI/Hepatic hiatal hernia, GERD  ,  Endo/Other  Hypothyroidism obese  Renal/GU      Musculoskeletal  (+) Arthritis ,   Abdominal   Peds  Hematology   Anesthesia Other Findings   Reproductive/Obstetrics                             Anesthesia Physical Anesthesia Plan  ASA: II  Anesthesia Plan: General   Post-op Pain Management:    Induction: Intravenous  PONV Risk Score and Plan: 2 and Ondansetron, Dexamethasone, Midazolam and Treatment may vary due to age or medical condition  Airway Management Planned: Oral ETT  Additional Equipment:   Intra-op Plan:   Post-operative Plan: Extubation in OR  Informed Consent: I have reviewed the patients History and Physical, chart, labs and discussed the procedure including the risks, benefits and alternatives for the proposed anesthesia with the patient or authorized representative who has indicated his/her understanding and acceptance.       Plan Discussed with: CRNA, Anesthesiologist and Surgeon  Anesthesia Plan Comments:         Anesthesia Quick Evaluation

## 2020-03-17 NOTE — Progress Notes (Signed)
Orthopedic Tech Progress Note Patient Details:  [name] [birth year] [id number] Called in order to [facility] for an [place] Patient ID: [name], female   DOB: 02-27-1961, 59 y.o.   MRN: [id number]   [name] 03/17/2020, 12:31 PM

## 2020-03-17 NOTE — Evaluation (Signed)
Physical Therapy Evaluation Patient Details Name: [name] MRN: [id number] DOB: 1961-08-21 Today's Date: 03/17/2020   History of Present Illness  Pt is a 59 y/o female s/p L5-S1 posterolateral fusion. PMH includes RA and prediabetes.   Clinical Impression  Patient is s/p above surgery resulting in the deficits listed below (see PT Problem List). Pt requiring min to min guard A For mobility tasks. Decreased weightshift to RLE noted during gait. Educated about back precautions and generalized walking program. Reports husband can assist as needed. Patient will benefit from skilled PT to increase their independence and safety with mobility (while adhering to their precautions) to allow discharge to the venue listed below.     Follow Up Recommendations Other (comment)(would benefit from outpatient PT once cleared by MD)    Equipment Recommendations  None recommended by PT    Recommendations for Other Services       Precautions / Restrictions Precautions Precautions: Back Precaution Booklet Issued: Yes (comment) Precaution Comments: Reviewed back precautions with pt.  Required Braces or Orthoses: Spinal Brace Spinal Brace: Lumbar corset Restrictions Weight Bearing Restrictions: No      Mobility  Bed Mobility Overal bed mobility: Needs Assistance Bed Mobility: Rolling;Sidelying to Sit;Sit to Sidelying Rolling: Supervision Sidelying to sit: Supervision     Sit to sidelying: Supervision General bed mobility comments: Supervision for safety. Cues for sequencing using log roll technique.   Transfers Overall transfer level: Needs assistance Equipment used: None Transfers: Sit to/from Stand Sit to Stand: Min guard         General transfer comment: Min guard for steadying assist. Increased time required to stand. Held onto bed rail for support initially.   Ambulation/Gait Ambulation/Gait assistance: Min guard;Min assist Gait Distance (Feet): 120 Feet Assistive device:  None Gait Pattern/deviations: Step-through pattern;Decreased stride length;Decreased weight shift to left;Antalgic Gait velocity: Decreased   General Gait Details: Mildly unsteady gait. Pt with antalgic gait and decreased weightshift to LLE. Min guard to occasional min A for steadying. Educated about walking program to perform at home.   Stairs            Wheelchair Mobility    Modified Rankin (Stroke Patients Only)       Balance Overall balance assessment: Needs assistance Sitting-balance support: No upper extremity supported Sitting balance-Leahy Scale: Good     Standing balance support: No upper extremity supported;During functional activity Standing balance-Leahy Scale: Fair                               Pertinent Vitals/Pain Pain Assessment: Faces Faces Pain Scale: Hurts little more Pain Location: back Pain Descriptors / Indicators: Aching;Operative site guarding Pain Intervention(s): Monitored during session;Limited activity within patient's tolerance;Repositioned    Home Living Family/patient expects to be discharged to:: Private residence Living Arrangements: Spouse/significant other Available Help at Discharge: Family Type of Home: House Home Access: Stairs to enter Entrance Stairs-Rails: Right Entrance Stairs-Number of Steps: 3 Home Layout: One level Home Equipment: Shower seat - built in      Prior Function Level of Independence: Independent               Hand Dominance        Extremity/Trunk Assessment   Upper Extremity Assessment Upper Extremity Assessment: Defer to OT evaluation    Lower Extremity Assessment Lower Extremity Assessment: LLE deficits/detail LLE Deficits / Details: Reports some numbness in L thigh. Reports pain has decreased. Some functional weakness noted.  Cervical / Trunk Assessment Cervical / Trunk Assessment: Normal  Communication   Communication: No difficulties  Cognition Arousal/Alertness:  Awake/alert Behavior During Therapy: WFL for tasks assessed/performed Overall Cognitive Status: Within Functional Limits for tasks assessed                                        General Comments General comments (skin integrity, edema, etc.): Pt's husband present during session     Exercises     Assessment/Plan    PT Assessment Patient needs continued PT services  PT Problem List Decreased balance;Decreased mobility;Decreased knowledge of precautions;Pain;Decreased strength       PT Treatment Interventions Gait training;DME instruction;Stair training;Functional mobility training;Therapeutic activities;Therapeutic exercise;Balance training;Patient/family education    PT Goals (Current goals can be found in the Care Plan section)  Acute Rehab PT Goals Patient Stated Goal: to go home PT Goal Formulation: With patient Time For Goal Achievement: 03/31/20 Potential to Achieve Goals: Good    Frequency Min 5X/week   Barriers to discharge        Co-evaluation               AM-PAC PT "6 Clicks" Mobility  Outcome Measure Help needed turning from your back to your side while in a flat bed without using bedrails?: None Help needed moving from lying on your back to sitting on the side of a flat bed without using bedrails?: None Help needed moving to and from a bed to a chair (including a wheelchair)?: A Little Help needed standing up from a chair using your arms (e.g., wheelchair or bedside chair)?: A Little Help needed to walk in hospital room?: A Little Help needed climbing 3-5 steps with a railing? : A Lot 6 Click Score: 19    End of Session Equipment Utilized During Treatment: Gait belt;Back brace Activity Tolerance: Patient tolerated treatment well Patient left: in bed;with call bell/phone within reach;with family/visitor present Nurse Communication: Mobility status PT Visit Diagnosis: Unsteadiness on feet (R26.81);Pain Pain - part of body: (back )     Time: 1359-1415 PT Time Calculation (min) (ACUTE ONLY): 16 min   Charges:   PT Evaluation $PT Eval Low Complexity: 1 Low          Lou Miner, DPT  Acute Rehabilitation Services  Pager: [phone] Office: [phone]   Rudean Hitt 03/17/2020, 4:16 PM

## 2020-03-17 NOTE — Anesthesia Postprocedure Evaluation (Signed)
Anesthesia Post Note  Patient: [name]  Procedure(s) Performed: POSTERIOR LUMBAR INTERBODY FUSION, LUMBAR FIVE- SACRAL ONE, POSTERIOR NON-SEGMENTAL INSTRUMENTATION (N/A Spine Lumbar)     Patient location during evaluation: PACU Anesthesia Type: General Level of consciousness: awake and alert Pain management: pain level controlled Vital Signs Assessment: post-procedure vital signs reviewed and stable Respiratory status: spontaneous breathing, nonlabored ventilation, respiratory function stable and patient connected to nasal cannula oxygen Cardiovascular status: blood pressure returned to baseline and stable Postop Assessment: no apparent nausea or vomiting Anesthetic complications: no    Last Vitals:  Vitals:   03/17/20 1130 03/17/20 1155  BP: 108/68 102/81  Pulse: 67 (!) 59  Resp: 16 18  Temp: 36.5 C   SpO2: 99% 99%    Last Pain:  Vitals:   03/17/20 1515  PainSc: 5                  Rehmat Murtagh S

## 2020-03-17 NOTE — Op Note (Signed)
NEUROSURGERY OPERATIVE NOTE   PREOP DIAGNOSIS:  1. Lumbago with radiculopathy, L5-S1 2. Spondylolisthesis, L5-S1  POSTOP DIAGNOSIS: Same  PROCEDURE: 1. L5 laminectomy with facetectomy for decompression of exiting nerve roots, more than would be required for placement of interbody graft 2. Placement of anterior interbody device - Medtronic Titan 76mm TLIF banana cage 3. Posterior non-segmental instrumentation using cortical pedicle screws at L5 - S1 4. Interbody arthrodesis, L5-S1 5. Posterolateral fusion, L5-S1 6. Use of locally harvested bone autograft 7. Use of non-structural bone allograft - BMP  SURGEON: Dr. Consuella Lose, MD  ASSISTANT: Ferne Reus, PA-C  ANESTHESIA: General Endotracheal  EBL: 75cc  SPECIMENS: None  DRAINS: None  COMPLICATIONS: None immediate  CONDITION: Hemodynamically stable to PACU  HISTORY: [name] is a 59 y.o. female who has been followed in the outpatient clinic with back and L>R leg pain related to degenerative disc disease and spondylolisthesis at L5-S1.  She has previously undergone right-sided L5-S1 laminotomy and microdiscectomy with good relief of radiculopathy.  Multiple conservative treatments were attempted without significant improvement and we ultimately elected to proceed with surgical decompression and fusion. Risks, benefits, and alternative treatments were reviewed in detail in the office. After all questions were answered, informed consent was obtained and witnessed.  PROCEDURE IN DETAIL: The patient was brought to the operating room via stretcher. After induction of general anesthesia, the patient was positioned on the operative table in the prone position. All pressure points were meticulously padded.  Previous skin incision was then marked out and prepped and draped in the usual sterile fashion.  After timeout was conducted, skin was infiltrated with local anesthetic.  The previous skin incision was then opened  sharply and extended about 1/2 cm superiorly and inferiorly.  Bovie electrocautery was used to dissect the subcutaneous tissue until the lumbodorsal fascia was identified and incised. The muscle was then elevated in the subperiosteal plane and the L5 lamina and L5-S1 facet complexes were identified. Self-retaining retractors were then placed. Lateral fluoroscopy was taken with a dissector in the L5-S1 interspace to confirm our location.  At this point attention was turned to decompression. Complete  L5 laminectomy was completed with a high-speed drill and Kerrison punches.  Normal dura was identified.  A ball-tipped dissector was then used to identify the foramina bilaterally.  High-speed drill was used to cut across the pars interarticularis and the inferior articulating process of L5 was removed bilaterally.    After removal of the right-sided inferior articulating process, I did note a significant amount of epidural fibrosis on the dorsal and lateral aspect of the right S1 nerve root.  After further removal of the articulating processes, I was able to identify the bilateral exiting L5 nerve roots as well as the proximal portion of the traversing S1 nerve roots bilaterally. I was then able to easily pass a ball dissector in the ventral epidural space and on the dorsal surface of the S1 nerve roots bilaterally indicating good decompression.  Unfortunately, there was a significant amount of fibrosis along the lateral aspect of the right S1 nerve root and in the ventral epidural space on the right side.  Rather than risking dural tear or excessive traction on the right S1 root, I elected to perform a transforaminal interbody fusion and discectomy from the left side alone.  Disc space was then incised on the left, and using a combination of shavers, curettes and rongeurs, complete discectomy was completed. Endplates were prepared with curettes.  An 8 mm cage was  then selected, packed with morselized bone autograft  and tapped into place.  Once the anterior portion of the cage was at the anterior longitudinal ligament, the inserter was removed and Tams were used to then positioned the cage more transversely in the anterior portion of the disc space.  Position was confirmed with AP and lateral fluoroscopy.  The disc space was then backfilled with a combination of morselized bone autograft harvested during the decompression mixed with BMP.  At this point, the entry points for bilateral L5 and S1 cortical pedicle screws were identified using standard anatomic landmarks and lateral fluoro. Pilot holes were then drilled and tapped to 5.5 x 35 millimeters at L5 and 6.5 x 30 mm at S1. Screws were then placed in L5 and S1 bilaterally.   At this point, on the right side there was a fair bit of bone from the right L5-S1 facet complex remaining, and I therefore elected to perform a posterior lateral arthrodesis on this side.  The high-speed drill was used to decorticate the lateral aspect of the right S1 lamina as well as the remaining portion of the L5-S1 facet complex.  This was then covered with the remaining BMP and bone autograft for posterolateral arthrodesis.  A 20 mm pre-bent lordotic rod was then placed into the pedicle screw tulips.  Setscrews were placed and final tightened.  Final AP and lateral fluoroscopic images demonstrated good position of the construct.  Wound was then irrigated with copious amounts of normal saline irrigation.  Self-retaining retractors were removed and the wound was closed in multiple layers using a combination of interrupted 0 and 3-0 Vicryl stitches.  Dermabond was applied.  After the Dermabond dried a sterile dressing was placed.  At the end of the case all sponge, needle, instrument, and cottonoid counts were correct.  Patient was then transferred to the stretcher, extubated, and taken to the postanesthesia care unit in stable hemodynamic condition.

## 2020-03-17 NOTE — Anesthesia Procedure Notes (Signed)
Procedure Name: Intubation Date/Time: 03/17/2020 7:58 AM Performed by: Bryson Corona, CRNA Pre-anesthesia Checklist: Patient identified, Emergency Drugs available, Suction available and Patient being monitored Patient Re-evaluated:Patient Re-evaluated prior to induction Oxygen Delivery Method: Circle System Utilized Preoxygenation: Pre-oxygenation with 100% oxygen Induction Type: IV induction Ventilation: Mask ventilation without difficulty Laryngoscope Size: Mac and 3 Grade View: Grade I Tube type: Oral Tube size: 7.0 mm Number of attempts: 1 Airway Equipment and Method: Stylet and Oral airway Placement Confirmation: ETT inserted through vocal cords under direct vision,  positive ETCO2 and breath sounds checked- equal and bilateral Secured at: 20 cm Tube secured with: Tape Dental Injury: Teeth and Oropharynx as per pre-operative assessment

## 2020-03-18 LAB — APTT: aPTT: 30 seconds (ref 24–36)

## 2020-03-18 LAB — PROTIME-INR
INR: 1 (ref 0.8–1.2)
Prothrombin Time: 13.1 seconds (ref 11.4–15.2)

## 2020-03-18 LAB — CBC
HCT: 35.7 % — ABNORMAL LOW (ref 36.0–46.0)
Hemoglobin: 11.2 g/dL — ABNORMAL LOW (ref 12.0–15.0)
MCH: 28 pg (ref 26.0–34.0)
MCHC: 31.4 g/dL (ref 30.0–36.0)
MCV: 89.3 fL (ref 80.0–100.0)
Platelets: 263 10*3/uL (ref 150–400)
RBC: 4 MIL/uL (ref 3.87–5.11)
RDW: 15.5 % (ref 11.5–15.5)
WBC: 8.9 10*3/uL (ref 4.0–10.5)
nRBC: 0 % (ref 0.0–0.2)

## 2020-03-18 LAB — BASIC METABOLIC PANEL
Anion gap: 12 (ref 5–15)
BUN: 7 mg/dL (ref 6–20)
CO2: 25 mmol/L (ref 22–32)
Calcium: 9.2 mg/dL (ref 8.9–10.3)
Chloride: 100 mmol/L (ref 98–111)
Creatinine, Ser: 0.76 mg/dL (ref 0.44–1.00)
GFR calc Af Amer: >60 mL/min (ref >60)
GFR calc non Af Amer: >60 mL/min (ref >60)
Glucose, Bld: 174 mg/dL — ABNORMAL HIGH (ref 70–99)
Potassium: 3.4 mmol/L — ABNORMAL LOW (ref 3.5–5.1)
Sodium: 137 mmol/L (ref 135–145)

## 2020-03-18 MED ORDER — METHOTREXATE 2.5 MG PO TABS: 7.5000 mg | ORAL_TABLET | ORAL | 0 refills | Status: DC

## 2020-03-18 MED ORDER — OXYCODONE-ACETAMINOPHEN 7.5-325 MG PO TABS: 1.0000 | ORAL_TABLET | ORAL | 0 refills | Status: DC | PRN

## 2020-03-18 MED ORDER — METHOCARBAMOL 750 MG PO TABS
750.0000 mg | ORAL_TABLET | Freq: Three times a day (TID) | ORAL | 1 refills | Status: DC | PRN
Start: 2020-03-18 — End: 2020-04-13

## 2020-03-18 NOTE — Evaluation (Signed)
Occupational Therapy Evaluation Patient Details Name: [name] MRN: [id number] DOB: 1961-08-07 Today's Date: 03/18/2020    History of Present Illness Pt is a 59 y/o female s/p L5-S1 posterolateral fusion. PMH includes RA and prediabetes.    Clinical Impression   PTA, pt was living at home with her husband and she was independent with ADL/IADL and functional mobility. Pt currently requires supervision to minguard assist for completion of ADL/IADL and functional mobility without AD. Pt demonstrates good carry over of back precautions. Patient evaluated by Occupational Therapy with no further acute OT needs identified. All education has been completed and the patient has no further questions. See below for any follow-up Occupational Therapy or equipment needs. OT to sign off. Thank you for referral.      Follow Up Recommendations  No OT follow up    Equipment Recommendations  None recommended by OT    Recommendations for Other Services       Precautions / Restrictions Precautions Precautions: Back Precaution Booklet Issued: Yes (comment) Precaution Comments: Reviewed back precautions with pt.  Required Braces or Orthoses: Spinal Brace Spinal Brace: Lumbar corset Restrictions Weight Bearing Restrictions: No      Mobility Bed Mobility Overal bed mobility: Modified Independent Bed Mobility: Rolling;Sit to Sidelying Rolling: Modified independent (Device/Increase time) Sidelying to sit: Supervision     Sit to sidelying: Modified independent (Device/Increase time) General bed mobility comments: Pt demonstrated proper log roll with rails lowered and HOB slightly elevated to simulate home environment. Pt has an adjustable bed at home.   Transfers Overall transfer level: Needs assistance Equipment used: None Transfers: Sit to/from Stand Sit to Stand: Min guard         General transfer comment: minguard from lower surface    Balance Overall balance assessment: Needs  assistance Sitting-balance support: No upper extremity supported Sitting balance-Leahy Scale: Good     Standing balance support: No upper extremity supported;During functional activity Standing balance-Leahy Scale: Fair                             ADL either performed or assessed with clinical judgement   ADL Overall ADL's : Needs assistance/impaired Eating/Feeding: Set up;Sitting   Grooming: Supervision/safety;Standing Grooming Details (indicate cue type and reason): supervision for adherence to precautions Upper Body Bathing: Supervision/ safety;Sitting   Lower Body Bathing: Supervison/ safety;Sit to/from stand   Upper Body Dressing : Supervision/safety;Sitting Upper Body Dressing Details (indicate cue type and reason): supervision for correct donning/doffing of back brace Lower Body Dressing: Supervision/safety Lower Body Dressing Details (indicate cue type and reason): pt able to figure-4 Toilet Transfer: Min guard;Ambulation   Toileting- Clothing Manipulation and Hygiene: Min guard;Sit to/from stand Toileting - Clothing Manipulation Details (indicate cue type and reason): from lower surface     Functional mobility during ADLs: Min guard General ADL Comments: minguard for safety with mobility     Vision         Perception     Praxis      Pertinent Vitals/Pain Pain Assessment: 0-10 Pain Score: 4  Pain Location: incision site Pain Descriptors / Indicators: Aching;Operative site guarding Pain Intervention(s): Limited activity within patient's tolerance;Monitored during session     Hand Dominance Right   Extremity/Trunk Assessment Upper Extremity Assessment Upper Extremity Assessment: Overall WFL for tasks assessed   Lower Extremity Assessment Lower Extremity Assessment: Defer to PT evaluation LLE Deficits / Details: Reports some numbness in L thigh. Reports pain has decreased.  Some functional weakness noted.    Cervical / Trunk  Assessment Cervical / Trunk Assessment: Normal   Communication Communication Communication: No difficulties   Cognition Arousal/Alertness: Awake/alert Behavior During Therapy: WFL for tasks assessed/performed Overall Cognitive Status: Within Functional Limits for tasks assessed                                 General Comments: pt demonstrated good carry over of precautions   General Comments  pt's husband present during session;educated pt on setting items at counter top height    Exercises     Shoulder Instructions      Home Living Family/patient expects to be discharged to:: Private residence Living Arrangements: Spouse/significant other Available Help at Discharge: Family Type of Home: House Home Access: Stairs to enter Technical brewer of Steps: 3 Entrance Stairs-Rails: Right Home Layout: One level     Bathroom Shower/Tub: Walk-in shower;Tub only   Bathroom Toilet: Standard     Home Equipment: Shower seat - built in          Prior Functioning/Environment Level of Independence: Independent                 OT Problem List: Decreased activity tolerance;Impaired balance (sitting and/or standing);Decreased knowledge of precautions;Pain;Obesity      OT Treatment/Interventions:      OT Goals(Current goals can be found in the care plan section) Acute Rehab OT Goals Patient Stated Goal: to go home today OT Goal Formulation: With patient Time For Goal Achievement: 03/25/20 Potential to Achieve Goals: Good  OT Frequency:     Barriers to D/C:            Co-evaluation              AM-PAC OT "6 Clicks" Daily Activity     Outcome Measure Help from another person eating meals?: A Little Help from another person taking care of personal grooming?: A Little Help from another person toileting, which includes using toliet, bedpan, or urinal?: A Little Help from another person bathing (including washing, rinsing, drying)?: A  Little Help from another person to put on and taking off regular upper body clothing?: A Little Help from another person to put on and taking off regular lower body clothing?: A Little 6 Click Score: 18   End of Session Equipment Utilized During Treatment: Back brace Nurse Communication: Mobility status(improper fitting of back brace)  Activity Tolerance: Patient tolerated treatment well Patient left: in bed;with call bell/phone within reach;with family/visitor present  OT Visit Diagnosis: Other abnormalities of gait and mobility (R26.89);Pain Pain - part of body: (back incision site)                Time: [id number] OT Time Calculation (min): 24 min Charges:  OT General Charges $OT Visit: 1 Visit OT Evaluation $OT Eval Moderate Complexity: 1 Mod OT Treatments $Self Care/Home Management : 8-22 mins  Helene Kelp OTR/L Acute Rehabilitation Services Office: [phone]   Wyn Forster 03/18/2020, 9:34 AM

## 2020-03-18 NOTE — Progress Notes (Signed)
Physical Therapy Treatment Patient Details Name: [name] MRN: [id number] DOB: June 19, 1961 Today's Date: 03/18/2020    History of Present Illness Pt is a 59 y/o female s/p L5-S1 posterolateral fusion. PMH includes RA and prediabetes.     PT Comments    Pt progressing well with post-op mobility. She was able to demonstrate transfers and ambulation with gross modified independence and no AD and completed stair training.. Pt was educated on precautions, brace application/wearing schedule, appropriate activity progression, and car transfer. Pt has met all acute PT goals and is safe for d/c from a mobility standpoint. Will sign off at this time. If needs change, please reconsult.    Follow Up Recommendations  No PT follow up     Equipment Recommendations  None recommended by PT    Recommendations for Other Services       Precautions / Restrictions Precautions Precautions: Back Precaution Booklet Issued: Yes (comment) Precaution Comments: Reviewed back precautions with pt.  Required Braces or Orthoses: Spinal Brace Spinal Brace: Lumbar corset Restrictions Weight Bearing Restrictions: No    Mobility  Bed Mobility Overal bed mobility: Modified Independent Bed Mobility: Rolling;Sit to Sidelying Rolling: Modified independent (Device/Increase time)       Sit to sidelying: Modified independent (Device/Increase time) General bed mobility comments: Pt demonstrated proper log roll with rails lowered and HOB slightly elevated to simulate home environment. Pt has an adjustable bed at home.   Transfers Overall transfer level: Modified independent Equipment used: None Transfers: Sit to/from Stand           General transfer comment: No assist required. Pt pushing off of bed with proper hand placement.   Ambulation/Gait Ambulation/Gait assistance: Modified independent (Device/Increase time) Gait Distance (Feet): 400 Feet Assistive device: None Gait Pattern/deviations:  Step-through pattern;Decreased stride length Gait velocity: Decreased Gait velocity interpretation: 1.31 - 2.62 ft/sec, indicative of limited community ambulator General Gait Details: Improved gait pattern from evaluation. Pt with good posture throughout ambulation activity.    Stairs Stairs: Yes Stairs assistance: Modified independent (Device/Increase time) Stair Management: One rail Left;Step to pattern;Forwards Number of Stairs: 4 General stair comments: VC's for sequencing and general safety. Pt advancing up with RLE and down with LLE first.    Wheelchair Mobility    Modified Rankin (Stroke Patients Only)       Balance Overall balance assessment: Needs assistance Sitting-balance support: No upper extremity supported Sitting balance-Leahy Scale: Good     Standing balance support: No upper extremity supported;During functional activity Standing balance-Leahy Scale: Fair                              Cognition Arousal/Alertness: Awake/alert Behavior During Therapy: WFL for tasks assessed/performed Overall Cognitive Status: Within Functional Limits for tasks assessed                                        Exercises      General Comments        Pertinent Vitals/Pain Pain Assessment: 0-10 Pain Score: 5  Pain Location: headache Pain Intervention(s): RN gave pain meds during session    Home Living                      Prior Function            PT Goals (current goals can now be  found in the care plan section) Acute Rehab PT Goals Patient Stated Goal: to go home PT Goal Formulation: With patient Time For Goal Achievement: 03/31/20 Potential to Achieve Goals: Good Progress towards PT goals: Goals met/education completed, patient discharged from PT    Frequency    Min 5X/week      PT Plan Current plan remains appropriate    Co-evaluation              AM-PAC PT "6 Clicks" Mobility   Outcome Measure  Help  needed turning from your back to your side while in a flat bed without using bedrails?: None Help needed moving from lying on your back to sitting on the side of a flat bed without using bedrails?: None Help needed moving to and from a bed to a chair (including a wheelchair)?: A Little Help needed standing up from a chair using your arms (e.g., wheelchair or bedside chair)?: A Little Help needed to walk in hospital room?: A Little Help needed climbing 3-5 steps with a railing? : A Lot 6 Click Score: 19    End of Session Equipment Utilized During Treatment: Back brace Activity Tolerance: Patient tolerated treatment well Patient left: in bed;with call bell/phone within reach;with family/visitor present Nurse Communication: Mobility status PT Visit Diagnosis: Unsteadiness on feet (R26.81);Pain Pain - part of body: (back )     Time: 6816-6196 PT Time Calculation (min) (ACUTE ONLY): 14 min  Charges:  $Gait Training: 8-22 mins                     Rolinda Roan, PT, DPT Acute Rehabilitation Services Pager: [phone] Office: [phone]    Thelma Comp 03/18/2020, 8:06 AM

## 2020-03-18 NOTE — Progress Notes (Signed)
  NEUROSURGERY PROGRESS NOTE   No issues overnight.  Pre op pain essentially resolved Ambulating in halls. Tolerating po. Voiding normal Eager for discharge  EXAM:  BP (!) 99/58 (BP Location: Left Arm)   Pulse 70   Temp 98.5 F (36.9 C) (Oral)   Resp 20   Ht 5\' 4"  (1.626 m) Comment: Simultaneous filing. User may not have seen previous data.  Wt 96 kg Comment: Simultaneous filing. User may not have seen previous data.  SpO2 98%   BMI 36.33 kg/m   Awake, alert, oriented  Speech fluent, appropriate  CN grossly intact  5/5 BUE/BLE  Incision c/d/i  IMPRESSION/PLAN 59 y.o. female POD1 L5-S1 fusion. Doing well - discharge home

## 2020-03-18 NOTE — Plan of Care (Signed)
Pt doing well. Pt and husband given D/C instructions with Rx's, verbal understanding was provided. Pt's incision is clean and dry with no sign of infection. Pt's IV was removed prior to D/C. Pt D/C'd home via wheelchair per MD order. Pt is stable @ D/C and has no other needs at this time. Holli Humbles, RN

## 2020-03-18 NOTE — Discharge Summary (Signed)
Physician Discharge Summary  Patient ID: [name] MRN: [id number] DOB/AGE: 10/26/1960 59 y.o.  Admit date: 03/17/2020 Discharge date: 03/18/2020  Admission Diagnoses:  Lumbar radiculopathy  Discharge Diagnoses:  Same Active Problems:   Lumbar radiculopathy   Discharged Condition: Stable  Hospital Course:  [name] is a 59 y.o. female who was admitted for the below procedure. There were no post operative complications. At time of discharge, pain was well controlled, ambulating with Pt/OT, tolerating po, voiding normal. Ready for discharge.  Treatments: Surgery L5-S1 fusion  Discharge Exam: Blood pressure (!) 99/58, pulse 70, temperature 98.5 F (36.9 C), temperature source Oral, resp. rate 20, height 5\' 4"  (1.626 m), weight 96 kg, SpO2 98 %. Awake, alert, oriented Speech fluent, appropriate CN grossly intact 5/5 BUE/BLE Wound c/d/i  Disposition: Discharge disposition: 01-Home or Self Care       Discharge Instructions    Call MD for:  difficulty breathing, headache or visual disturbances   Complete by: As directed    Call MD for:  persistant dizziness or light-headedness   Complete by: As directed    Call MD for:  redness, tenderness, or signs of infection (pain, swelling, redness, odor or green/yellow discharge around incision site)   Complete by: As directed    Call MD for:  severe uncontrolled pain   Complete by: As directed    Call MD for:  temperature >100.4   Complete by: As directed    Diet general   Complete by: As directed    Driving Restrictions   Complete by: As directed    Do not drive until given clearance.   Increase activity slowly   Complete by: As directed    Lifting restrictions   Complete by: As directed    Do not lift anything >10lbs. Avoid bending and twisting in awkward positions. Avoid bending at the back.   May shower / Bathe   Complete by: As directed    In 24 hours. Okay to wash wound with warm soapy water. Avoid  scrubbing the wound. Pat dry.   Remove dressing in 24 hours   Complete by: As directed      Allergies as of 03/18/2020      Reactions   Penicillins Itching      Medication List    STOP taking these medications   diclofenac 75 MG EC tablet Commonly known as: VOLTAREN   meloxicam 15 MG tablet Commonly known as: MOBIC     TAKE these medications   CALCIUM 600/VITAMIN D PO Take 1 tablet by mouth daily.   DHEA 50 MG Caps Take 50 mg by mouth daily.   folic acid 1 MG tablet Commonly known as: FOLVITE Take 1 mg by mouth daily.   levothyroxine 88 MCG tablet Commonly known as: SYNTHROID Take 1 tablet (88 mcg total) by mouth daily.   methocarbamol 750 MG tablet Commonly known as: Robaxin-750 Take 1 tablet (750 mg total) by mouth 3 (three) times daily as needed for muscle spasms.   methotrexate 2.5 MG tablet Commonly known as: RHEUMATREX Take 3 tablets (7.5 mg total) by mouth See admin instructions. Take 7.5 mg by mouth on Monday and Tuesday Start taking on: March 25, 2020 What changed: These instructions start on March 25, 2020. If you are unsure what to do until then, ask your doctor or other care provider.   OVER THE COUNTER MEDICATION Place 1 patch onto the skin daily. Thrive Vitamin Patch   OVER THE COUNTER MEDICATION Take 1  Scoop by mouth daily. Thrive Nutritional Supplement   oxyCODONE-acetaminophen 7.5-325 MG tablet Commonly known as: Percocet Take 1 tablet by mouth every 4 (four) hours as needed.   Thrive For Life Womens Tabs Take 2 tablets by mouth daily.   vitamin B-12 1000 MCG tablet Commonly known as: CYANOCOBALAMIN Take 1,000 mcg by mouth daily.   Vitamin D3 50 MCG (2000 UT) capsule Take 2,000 Units by mouth daily.      Follow-up Information    Consuella Lose, MD. Schedule an appointment as soon as possible for a visit in 3 week(s).   Specialty: Neurosurgery Contact information: [address] [place]  [address] [phone]           Signed: [name], [name] 03/18/2020, 7:49 AM

## 2020-03-28 ENCOUNTER — Other Ambulatory Visit: Payer: Self-pay | Admitting: Family Medicine

## 2020-03-30 ENCOUNTER — Telehealth: Payer: Self-pay | Admitting: Family Medicine

## 2020-03-30 DIAGNOSIS — E038 Other specified hypothyroidism: Secondary | ICD-10-CM

## 2020-03-30 MED ORDER — LEVOTHYROXINE SODIUM 88 MCG PO TABS: 88.0000 ug | ORAL_TABLET | Freq: Every day | ORAL | 0 refills | Status: DC

## 2020-03-30 NOTE — Telephone Encounter (Signed)
Last seen for thyroid on 06/19/19 and tsh done on 06/14/19

## 2020-03-30 NOTE — Telephone Encounter (Signed)
May have 90, 1 daily, TSH and free T4 before office visit preferably Follow-up in July as planned

## 2020-03-30 NOTE — Telephone Encounter (Signed)
Lab orders placed and medication sent to pharmacy. Pt is aware and verbalized understanding.

## 2020-03-30 NOTE — Telephone Encounter (Signed)
Patient is requesting refill on levothyroline 88 mcg ,she has appointment schedule for 05/01/2020 due to having back surgery cant come in any sooner . [facility]

## 2020-04-13 ENCOUNTER — Ambulatory Visit (INDEPENDENT_AMBULATORY_CARE_PROVIDER_SITE_OTHER): Payer: 59 | Admitting: Family Medicine

## 2020-04-13 ENCOUNTER — Other Ambulatory Visit: Payer: Self-pay

## 2020-04-13 DIAGNOSIS — K219 Gastro-esophageal reflux disease without esophagitis: Secondary | ICD-10-CM | POA: Diagnosis not present

## 2020-04-13 DIAGNOSIS — J40 Bronchitis, not specified as acute or chronic: Secondary | ICD-10-CM | POA: Diagnosis not present

## 2020-04-13 MED ORDER — AZITHROMYCIN 250 MG PO TABS: ORAL_TABLET | ORAL | 0 refills | Status: DC

## 2020-04-13 NOTE — Progress Notes (Signed)
Patient ID: [name], female    DOB: [birth year], 59 y.o.   MRN: [id number]  Virtual Visit via video Note  I connected with Mabeline Caras on 04/17/20 at  2:00 PM EDT by telephone and verified that I am speaking with the correct person using two identifiers.  Location: Patient: home Provider: office   I discussed the limitations, risks, security and privacy concerns of performing an evaluation and management service by telephone and the availability of in person appointments. I also discussed with the patient that there may be a patient responsible charge related to this service. The patient expressed understanding and agreed to proceed.  Chief Complaint  Patient presents with   Cough    Pt had acid reflux about 3 days ago and that led to sore throat and cough. Pt is coughing up some mucus but not alot. no fever, very little energy. pt usually sleep with bed inclined but this particulary night she did not.. pt has taken Tums, halls cough drops and extra strength Tylenol. pt did have back surgery 3 week ago and began to take magnesium gummies.    Subjective:    HPI Pt having worsening coughing and sore throat for past 3 days.  Noticed 3 days ago sleeping flat and had a lot of reflux in throat and heart burn.  Usually sleeping at incline due to reflux.  But that night laid flat due to her surgery in back and feeling exhausted.  Reported eating garlic that night and having reflux. Infrequent, not on meds daily for this.  Pt is immunocompromised and taking methotrexate and humaira for RA.  Medical History Armonie has a past medical history of Abdominal pain of unknown etiology, Anal fistula, Aortic atherosclerosis ([place]), Constipation, DDD (degenerative disc disease), lumbar, Depression, Diarrhea, GERD (gastroesophageal reflux disease), History of hiatal hernia, Hypothyroidism, Iron deficiency anemia, Left shoulder pain, Low back pain, Lumbar herniated disc, Neck pain, Numbness of  right lower extremity, Olecranon bursitis, Plantar fasciitis, left, Prediabetes, RA (rheumatoid arthritis) ([place]), Right carpal tunnel syndrome (01/09/2018), S/P colonoscopy (April 2011), S/P endoscopy (April 2011), Sciatic leg pain, Ulnar neuropathy at elbow, right (01/09/2018), Vitamin D deficiency, and Weakness of right lower extremity.   Outpatient Encounter Medications as of 04/13/2020  Medication Sig   Calcium Acetate-Magnesium Carb (MAGNEBIND 200 PO) Take by mouth.   Calcium Carbonate-Vitamin D (CALCIUM 600/VITAMIN D PO) Take 1 tablet by mouth daily.   Cholecalciferol (VITAMIN D3) 50 MCG (2000 UT) capsule Take 2,000 Units by mouth daily.    DHEA 50 MG CAPS Take 50 mg by mouth daily.   folic acid (FOLVITE) 1 MG tablet Take 1 mg by mouth daily.   HUMIRA PEN 40 MG/0.4ML PNKT SMARTSIG:40 Milligram(s) SUB-Q Every 2 Weeks   levothyroxine (SYNTHROID) 88 MCG tablet Take 1 tablet (88 mcg total) by mouth daily.   methotrexate (RHEUMATREX) 2.5 MG tablet Take 3 tablets (7.5 mg total) by mouth See admin instructions. Take 7.5 mg by mouth on Monday and Tuesday   vitamin B-12 (CYANOCOBALAMIN) 1000 MCG tablet Take 1,000 mcg by mouth daily.   azithromycin (ZITHROMAX) 250 MG tablet Take 2 tab p.o. day 1, then take 1 tab p.o. for 4 more days.   [DISCONTINUED] methocarbamol (ROBAXIN-750) 750 MG tablet Take 1 tablet (750 mg total) by mouth 3 (three) times daily as needed for muscle spasms.   [DISCONTINUED] Multiple Vitamins-Minerals (THRIVE FOR LIFE WOMENS) TABS Take 2 tablets by mouth daily.   [DISCONTINUED] OVER THE COUNTER MEDICATION Place 1  patch onto the skin daily. Thrive Vitamin Patch   [DISCONTINUED] OVER THE COUNTER MEDICATION Take 1 Scoop by mouth daily. Thrive Nutritional Supplement   [DISCONTINUED] oxyCODONE-acetaminophen (PERCOCET) 7.5-325 MG tablet Take 1 tablet by mouth every 4 (four) hours as needed.   No facility-administered encounter medications on file as of 04/13/2020.      Review of Systems  Constitutional: Negative for chills and fever.  HENT: Positive for sore throat. Negative for congestion and rhinorrhea.   Respiratory: Positive for cough. Negative for shortness of breath and wheezing.   Cardiovascular: Negative for chest pain and leg swelling.  Gastrointestinal: Negative for abdominal pain, diarrhea, nausea and vomiting.       +reflux, heartburn  Genitourinary: Negative for dysuria and frequency.  Musculoskeletal: Negative for arthralgias and back pain.  Skin: Negative for rash.  Neurological: Negative for dizziness, weakness and headaches.     Vitals There were no vitals taken for this visit.  Objective:   Physical Exam  Video visit, NAD. No resp distress. PE limited due to video visit.  Assessment and Plan   1. Bronchitis - azithromycin (ZITHROMAX) 250 MG tablet; Take 2 tab p.o. day 1, then take 1 tab p.o. for 4 more days.  Dispense: 6 tablet; Refill: 0  2. Gastroesophageal reflux disease, unspecified whether esophagitis present    Pt given azithromycin, advising to call back in 3-4 days if not improving or if getting fever, worsening cough or sob.  Discussed concern that if not improving may need to come in for in person visit and concern for aspiration pneumonia.  Pt advised to take pepcid daily and sleep at incline.  F/u prn.  Pt in agreement.   Follow Up Instructions:    I discussed the assessment and treatment plan with the patient. The patient was provided an opportunity to ask questions and all were answered. The patient agreed with the plan and demonstrated an understanding of the instructions.   The patient was advised to call back or seek an in-person evaluation if the symptoms worsen or if the condition fails to improve as anticipated.  I provided 11 minutes of non-face-to-face time during this encounter.   [place], DO

## 2020-04-17 ENCOUNTER — Encounter: Payer: Self-pay | Admitting: Family Medicine

## 2020-04-30 LAB — TSH: TSH: 3.67 u[IU]/mL (ref 0.450–4.500)

## 2020-04-30 LAB — T4, FREE: Free T4: 1.16 ng/dL (ref 0.82–1.77)

## 2020-05-01 ENCOUNTER — Other Ambulatory Visit: Payer: Self-pay

## 2020-05-01 ENCOUNTER — Ambulatory Visit (INDEPENDENT_AMBULATORY_CARE_PROVIDER_SITE_OTHER): Payer: 59 | Admitting: Family Medicine

## 2020-05-01 ENCOUNTER — Encounter: Payer: Self-pay | Admitting: Family Medicine

## 2020-05-01 VITALS — BP 126/80 | Temp 97.2°F | Ht 64.0 in | Wt 215.6 lb

## 2020-05-01 DIAGNOSIS — E038 Other specified hypothyroidism: Secondary | ICD-10-CM

## 2020-05-01 DIAGNOSIS — R252 Cramp and spasm: Secondary | ICD-10-CM | POA: Diagnosis not present

## 2020-05-01 DIAGNOSIS — Z9884 Bariatric surgery status: Secondary | ICD-10-CM | POA: Diagnosis not present

## 2020-05-01 DIAGNOSIS — E559 Vitamin D deficiency, unspecified: Secondary | ICD-10-CM | POA: Diagnosis not present

## 2020-05-01 DIAGNOSIS — Z1211 Encounter for screening for malignant neoplasm of colon: Secondary | ICD-10-CM

## 2020-05-01 DIAGNOSIS — E785 Hyperlipidemia, unspecified: Secondary | ICD-10-CM

## 2020-05-01 MED ORDER — LEVOTHYROXINE SODIUM 88 MCG PO TABS: 88.0000 ug | ORAL_TABLET | Freq: Every day | ORAL | 1 refills | Status: DC

## 2020-05-01 NOTE — Progress Notes (Signed)
   Subjective:    Patient ID: [name], female    DOB: Aug 27, 1961, 59 y.o.   MRN: [id number]  HPI   Patient arrives for a follow up on Hypothyroidism. Patient states she had her lab work done this week and would like to discuss results.  Results for orders placed or performed in visit on 03/30/20  TSH  Result Value Ref Range   TSH 3.670 0.450 - 4.500 uIU/mL  T4, free  Result Value Ref Range   Free T4 1.16 0.82 - 1.77 ng/dL    Review of Systems    See above Objective:   Physical Exam Lungs clear respiratory rate normal heart regular no murmurs   Covid vaccine recommended    Assessment & Plan:  1. Other specified hypothyroidism Refills given  2. Leg cramps Check lab work - Vitamin B12 - Magnesium  3. Vitamin D deficiency Check lab work - VITAMIN D 25 Hydroxy (Vit-D Deficiency, Fractures)  4. History of gastric bypass Check lab work - Vitamin B12 - Ferritin - Iron Binding Cap (TIBC)([facility])  5. Hyperlipidemia, unspecified hyperlipidemia type Healthy diet recommended continue medication check lab work - Lipid panel  6. Encounter for screening colonoscopy Referral for colonoscopy - Ambulatory referral to Gastroenterology

## 2020-05-06 LAB — IRON AND TIBC
Iron Saturation: 7 % — CL (ref 15–55)
Iron: 36 ug/dL (ref 27–159)
Total Iron Binding Capacity: 490 ug/dL — ABNORMAL HIGH (ref 250–450)
UIBC: 454 ug/dL — ABNORMAL HIGH (ref 131–425)

## 2020-05-06 LAB — LIPID PANEL
Chol/HDL Ratio: 2.1 ratio (ref 0.0–4.4)
Cholesterol, Total: 186 mg/dL (ref 100–199)
HDL: 90 mg/dL (ref >39)
LDL Chol Calc (NIH): 87 mg/dL (ref 0–99)
Triglycerides: 46 mg/dL (ref 0–149)
VLDL Cholesterol Cal: 9 mg/dL (ref 5–40)

## 2020-05-06 LAB — VITAMIN D 25 HYDROXY (VIT D DEFICIENCY, FRACTURES): Vit D, 25-Hydroxy: 43.4 ng/mL (ref 30.0–100.0)

## 2020-05-06 LAB — VITAMIN B12: Vitamin B-12: 1680 pg/mL — ABNORMAL HIGH (ref 232–1245)

## 2020-05-06 LAB — MAGNESIUM: Magnesium: 2 mg/dL (ref 1.6–2.3)

## 2020-05-06 LAB — FERRITIN: Ferritin: 15 ng/mL (ref 15–150)

## 2020-05-12 ENCOUNTER — Encounter: Payer: Self-pay | Admitting: Family Medicine

## 2020-06-05 ENCOUNTER — Encounter: Payer: Self-pay | Admitting: Family Medicine

## 2020-06-10 ENCOUNTER — Telehealth: Payer: Self-pay | Admitting: Family Medicine

## 2020-06-10 NOTE — Telephone Encounter (Signed)
Pt was looking at her last lab results & states that Dr. Nicki Reaper mentioned the low iron being concerning, pt wonders what she should do? Take a supplement?  How much?  How often?  States she's often cold & uses an electric blanket even in this weather  Please advise & call pt    [facility]

## 2020-06-11 NOTE — Telephone Encounter (Signed)
Nurses Please talk with patient On previous result notes a referral to gastroenterology was made as well as note sent to the patient. Please find out from the patient has she ever heard anything from gastroenterology? If not please reaffirm with patient which gastroenterology group she would like to see. Then Obviously let me know Also reput in the referral Because what really needs to happen for this patient is iron infusion with gastroenterology and further work-up of her iron deficiency.

## 2020-06-12 NOTE — Telephone Encounter (Signed)
Lmtc

## 2020-06-15 NOTE — Telephone Encounter (Signed)
Patient states that [facility] will not accept patient (says she has been dismissed from practice) so her referral was sent to [facility] and they left her a message last week and she is going to call them back today to try to get scheduled. Patient states she will call us back if she is unable to get the appt with [facility].

## 2020-06-15 NOTE — Telephone Encounter (Signed)
Patient will be seen [facility] gastroenterology she will need a copy of her labs and most recent office visit sent there

## 2020-06-15 NOTE — Telephone Encounter (Signed)
Left message to return call 

## 2020-06-18 NOTE — Telephone Encounter (Signed)
Patient has been dismissed from [facility] gastroenterology that what noted in chart

## 2020-06-19 NOTE — Telephone Encounter (Signed)
Patient has been dismissed from [facility] per referral note but does have upcoming appt with [facility].

## 2020-06-23 NOTE — Telephone Encounter (Signed)
Patient is seeing [facility] gastroenterology needing recent office notes and labs didn't know if see needs referral sent also.

## 2020-06-23 NOTE — Telephone Encounter (Signed)
Records were sent to [facility] through [facility]

## 2020-08-09 ENCOUNTER — Telehealth: Payer: Self-pay | Admitting: Family Medicine

## 2020-08-09 NOTE — Telephone Encounter (Signed)
Nurses Please let her know that I received her medical records from [facility] Dr.Aryal  Her hemoglobin was low.  Back in the summer we made referral to gastroenterology for the patient to have further work-up of this she needs her colonoscopy.  Please find out from the patient has she done this?  If not this needs to be initiated.  If the patient refuses please document and forwarded to me why.  Thank you

## 2020-08-10 NOTE — Telephone Encounter (Signed)
Very good glad to hear

## 2020-08-10 NOTE — Telephone Encounter (Signed)
Pt returned call and verbalized understanding. Pt has colonoscopy set up for 09/30/20

## 2020-08-10 NOTE — Telephone Encounter (Signed)
Left message to return call 

## 2020-08-17 ENCOUNTER — Other Ambulatory Visit: Payer: 59

## 2020-08-17 DIAGNOSIS — Z20822 Contact with and (suspected) exposure to covid-19: Secondary | ICD-10-CM

## 2020-08-18 LAB — SARS-COV-2, NAA 2 DAY TAT

## 2020-08-18 LAB — NOVEL CORONAVIRUS, NAA: SARS-CoV-2, NAA: NOT DETECTED

## 2020-09-01 LAB — HM PAP SMEAR: HM Pap smear: NORMAL

## 2020-09-02 LAB — HM MAMMOGRAPHY: HM Mammogram: NORMAL (ref 0–4)

## 2020-10-07 ENCOUNTER — Telehealth: Payer: Self-pay | Admitting: Family Medicine

## 2020-10-07 DIAGNOSIS — D509 Iron deficiency anemia, unspecified: Secondary | ICD-10-CM

## 2020-10-07 DIAGNOSIS — R6889 Other general symptoms and signs: Secondary | ICD-10-CM

## 2020-10-07 NOTE — Telephone Encounter (Signed)
Patient would like to know if she can get her iron infusion she a has had colonoscopy its been a week she stated. Please advise

## 2020-10-07 NOTE — Telephone Encounter (Signed)
Pt contacted and states the she had a colonoscopy on Sep 30 2020 with Dr.Outlaw. They did fine 2 polyps. Dr.Outlaw stated that they were going to send the report to our office. Lab work to check iron levels were completed by out office back in July and pt states her rheumatologist also did some lab work. Pt reports being really cold, tired and craving ice. Pt states that provider informed her that her body may not be absorbing iron as normal. Please advise. Thank you

## 2020-10-07 NOTE — Telephone Encounter (Signed)
Please advise. Thank you

## 2020-10-07 NOTE — Telephone Encounter (Signed)
Nurses please talk with patient to clarify (Forgive me for being confused but I need more information) So the patient had a colonoscopy-with who?  What did they find? Has she had a recent lab work to look at iron studies? Has she done iron infusion in the past if so with you gastroenterology or hematology?

## 2020-10-07 NOTE — Telephone Encounter (Signed)
Lab orders placed and pt is aware 

## 2020-10-07 NOTE — Telephone Encounter (Signed)
So even though we did do blood work on iron levels back then in order to get her in with hematology or gastroenterology for iron infusion we would have to have up to date and blood work I would recommend TSH, free T4, ferritin, TIBC, serum iron, CBC due to cold intolerance and iron deficiency anemia

## 2020-10-08 LAB — CBC WITH DIFFERENTIAL/PLATELET
Basophils Absolute: 0.1 10*3/uL (ref 0.0–0.2)
Basos: 1 %
EOS (ABSOLUTE): 0.2 10*3/uL (ref 0.0–0.4)
Eos: 3 %
Hematocrit: 34.1 % (ref 34.0–46.6)
Hemoglobin: 10.7 g/dL — ABNORMAL LOW (ref 11.1–15.9)
Immature Grans (Abs): 0 10*3/uL (ref 0.0–0.1)
Immature Granulocytes: 0 %
Lymphocytes Absolute: 2.4 10*3/uL (ref 0.7–3.1)
Lymphs: 46 %
MCH: 25.4 pg — ABNORMAL LOW (ref 26.6–33.0)
MCHC: 31.4 g/dL — ABNORMAL LOW (ref 31.5–35.7)
MCV: 81 fL (ref 79–97)
Monocytes Absolute: 0.7 10*3/uL (ref 0.1–0.9)
Monocytes: 13 %
Neutrophils Absolute: 1.9 10*3/uL (ref 1.4–7.0)
Neutrophils: 37 %
Platelets: 293 10*3/uL (ref 150–450)
RBC: 4.22 x10E6/uL (ref 3.77–5.28)
RDW: 14.5 % (ref 11.7–15.4)
WBC: 5.2 10*3/uL (ref 3.4–10.8)

## 2020-10-08 LAB — IRON AND TIBC
Iron Saturation: 7 % — CL (ref 15–55)
Iron: 30 ug/dL (ref 27–159)
Total Iron Binding Capacity: 447 ug/dL (ref 250–450)
UIBC: 417 ug/dL (ref 131–425)

## 2020-10-08 LAB — T4, FREE: Free T4: 1.16 ng/dL (ref 0.82–1.77)

## 2020-10-08 LAB — FERRITIN: Ferritin: 9 ng/mL — ABNORMAL LOW (ref 15–150)

## 2020-10-08 LAB — TSH: TSH: 2.49 u[IU]/mL (ref 0.450–4.500)

## 2020-10-12 ENCOUNTER — Telehealth: Payer: Self-pay | Admitting: Hematology and Oncology

## 2020-10-12 ENCOUNTER — Telehealth: Payer: Self-pay

## 2020-10-12 ENCOUNTER — Other Ambulatory Visit: Payer: Self-pay | Admitting: Family Medicine

## 2020-10-12 DIAGNOSIS — D509 Iron deficiency anemia, unspecified: Secondary | ICD-10-CM

## 2020-10-12 NOTE — Telephone Encounter (Signed)
[name] with [facility] called because we sent in an urgent referral for iron def and she said they are backed up and she is having a hard time getting cancer patients in.  It will be the end of January before she can get this patient in and will that be ok?  If not we may have to send her to [place].   [phone]

## 2020-10-12 NOTE — Telephone Encounter (Signed)
Pt returned call. Pt states she does not remember having an iron transfusion. Pt is willing to go to [place]. Contacted Amy at [facility] and informed her that pt is OK with going to [place].

## 2020-10-12 NOTE — Telephone Encounter (Signed)
Received a new hem referral from Dr. Gerda Diss for IDA. Ms. [name] has been cld and scheduled to see Dr. Leonides Schanz on 1/14 at 2pm. Pt aware to arrive 30 minutes early.

## 2020-10-12 NOTE — Telephone Encounter (Signed)
Left message for pt to return call. Per result note, may have had one done through GI. If not, need to know if pt is willing to go to [facility].

## 2020-10-30 ENCOUNTER — Inpatient Hospital Stay: Payer: 59 | Attending: Hematology and Oncology

## 2020-10-30 ENCOUNTER — Encounter: Payer: Self-pay | Admitting: Hematology and Oncology

## 2020-10-30 ENCOUNTER — Other Ambulatory Visit: Payer: Self-pay

## 2020-10-30 ENCOUNTER — Inpatient Hospital Stay: Payer: 59 | Admitting: Hematology and Oncology

## 2020-10-30 VITALS — BP 104/62 | HR 60 | Temp 97.1°F | Resp 18 | Wt 192.8 lb

## 2020-10-30 DIAGNOSIS — Z8249 Family history of ischemic heart disease and other diseases of the circulatory system: Secondary | ICD-10-CM | POA: Insufficient documentation

## 2020-10-30 DIAGNOSIS — D509 Iron deficiency anemia, unspecified: Secondary | ICD-10-CM

## 2020-10-30 DIAGNOSIS — E559 Vitamin D deficiency, unspecified: Secondary | ICD-10-CM

## 2020-10-30 DIAGNOSIS — Z836 Family history of other diseases of the respiratory system: Secondary | ICD-10-CM | POA: Insufficient documentation

## 2020-10-30 DIAGNOSIS — Z9884 Bariatric surgery status: Secondary | ICD-10-CM | POA: Diagnosis not present

## 2020-10-30 DIAGNOSIS — E039 Hypothyroidism, unspecified: Secondary | ICD-10-CM

## 2020-10-30 DIAGNOSIS — Z87891 Personal history of nicotine dependence: Secondary | ICD-10-CM | POA: Diagnosis not present

## 2020-10-30 DIAGNOSIS — M5136 Other intervertebral disc degeneration, lumbar region: Secondary | ICD-10-CM | POA: Insufficient documentation

## 2020-10-30 DIAGNOSIS — D500 Iron deficiency anemia secondary to blood loss (chronic): Secondary | ICD-10-CM

## 2020-10-30 DIAGNOSIS — K30 Functional dyspepsia: Secondary | ICD-10-CM | POA: Insufficient documentation

## 2020-10-30 DIAGNOSIS — M7020 Olecranon bursitis, unspecified elbow: Secondary | ICD-10-CM | POA: Insufficient documentation

## 2020-10-30 DIAGNOSIS — K219 Gastro-esophageal reflux disease without esophagitis: Secondary | ICD-10-CM

## 2020-10-30 DIAGNOSIS — Z79899 Other long term (current) drug therapy: Secondary | ICD-10-CM | POA: Diagnosis not present

## 2020-10-30 DIAGNOSIS — Z880 Allergy status to penicillin: Secondary | ICD-10-CM

## 2020-10-30 DIAGNOSIS — Z8719 Personal history of other diseases of the digestive system: Secondary | ICD-10-CM

## 2020-10-30 DIAGNOSIS — K5900 Constipation, unspecified: Secondary | ICD-10-CM | POA: Insufficient documentation

## 2020-10-30 LAB — CBC WITH DIFFERENTIAL (CANCER CENTER ONLY)
Abs Immature Granulocytes: 0.01 10*3/uL (ref 0.00–0.07)
Basophils Absolute: 0.1 10*3/uL (ref 0.0–0.1)
Basophils Relative: 1 %
Eosinophils Absolute: 0.1 10*3/uL (ref 0.0–0.5)
Eosinophils Relative: 1 %
HCT: 34.1 % — ABNORMAL LOW (ref 36.0–46.0)
Hemoglobin: 10.9 g/dL — ABNORMAL LOW (ref 12.0–15.0)
Immature Granulocytes: 0 %
Lymphocytes Relative: 28 %
Lymphs Abs: 1.9 10*3/uL (ref 0.7–4.0)
MCH: 25.3 pg — ABNORMAL LOW (ref 26.0–34.0)
MCHC: 32 g/dL (ref 30.0–36.0)
MCV: 79.3 fL — ABNORMAL LOW (ref 80.0–100.0)
Monocytes Absolute: 0.7 10*3/uL (ref 0.1–1.0)
Monocytes Relative: 10 %
Neutro Abs: 4 10*3/uL (ref 1.7–7.7)
Neutrophils Relative %: 60 %
Platelet Count: 313 10*3/uL (ref 150–400)
RBC: 4.3 MIL/uL (ref 3.87–5.11)
RDW: 16 % — ABNORMAL HIGH (ref 11.5–15.5)
WBC Count: 6.6 10*3/uL (ref 4.0–10.5)
nRBC: 0 % (ref 0.0–0.2)

## 2020-10-30 LAB — RETIC PANEL
Immature Retic Fract: 5.7 % (ref 2.3–15.9)
RBC.: 4.33 MIL/uL (ref 3.87–5.11)
Retic Count, Absolute: 36.4 10*3/uL (ref 19.0–186.0)
Retic Ct Pct: 0.8 % (ref 0.4–3.1)
Reticulocyte Hemoglobin: 29.5 pg (ref >27.9)

## 2020-10-30 LAB — CMP (CANCER CENTER ONLY)
ALT: 26 U/L (ref 0–44)
AST: 26 U/L (ref 15–41)
Albumin: 3.9 g/dL (ref 3.5–5.0)
Alkaline Phosphatase: 124 U/L (ref 38–126)
Anion gap: 9 (ref 5–15)
BUN: 19 mg/dL (ref 6–20)
CO2: 26 mmol/L (ref 22–32)
Calcium: 9.4 mg/dL (ref 8.9–10.3)
Chloride: 101 mmol/L (ref 98–111)
Creatinine: 0.83 mg/dL (ref 0.44–1.00)
GFR, Estimated: >60 mL/min (ref >60)
Glucose, Bld: 83 mg/dL (ref 70–99)
Potassium: 4.2 mmol/L (ref 3.5–5.1)
Sodium: 136 mmol/L (ref 135–145)
Total Bilirubin: 0.3 mg/dL (ref 0.3–1.2)
Total Protein: 7.5 g/dL (ref 6.5–8.1)

## 2020-10-30 NOTE — Progress Notes (Signed)
[facility] [phone]   [phone] NOTE  Patient Care Team: Kathyrn Drown, MD as PCP - General (Family Medicine) Danie Binder, MD (Inactive) (Gastroenterology) Lahoma Rocker, MD as Consulting Physician (Rheumatology)  Hematological/Oncological History # Iron Deficiency Anemia 2/2 to Gastric Bipass 10/07/2020: WBC 5.2, Hgb 10.7, MCV 81, Plt 293. Iron 30, TIBC 447, Ferritin 9, Iron sat 7%.  10/30/2020: establish care with Dr. Lorenso Courier   CHIEF COMPLAINTS/PURPOSE OF CONSULTATION:  "Iron Deficiency Anemia "  HISTORY OF PRESENTING ILLNESS:  [name] 60 y.o. female with medical history significant for gastric bipass (2008), rheumatoid arthritis on MTX, hypothyroidism, GERD, constipation, olecranon bursitis, degenerative disk disease who presents for evaluation of iron deficiency anemia.   On review of the previous records [name] had a prior colonoscopy performed on 2011 based on our records. She reportedly had a repeat colonoscopy in Dec 2021.  More recently Mr. Dendy had a CBC drawn on 10/07/2020 which showed a white blood cell count 5.2, hemoglobin 10.7 MCV of 81, platelets of 293 with a ferritin of 5 and iron sat of 7%.  Due to concern for this iron deficiency anemia the patient was referred to hematology for further evaluation and management.  On exam today [name] reports that she has been having symptoms recently consistent with iron deficiency anemia.  She reports that she has had increasing fatigue, feeling cold, and eating ice.  She reports that she has munching ice to the point where she is concerned that it may damage her teeth that is actually causing pain.  She also notes a keeps her house at approximately 82 degrees and she is "driving her husband crazy".  She notes that both her PCP and rheumatologist recommended consideration of IV iron.  On further discussion the patient notes that she underwent a  gastric bypass surgery approximately 12 to 13 years ago.  She reports that it was a "mini gastric bypass" and she has lost approximately 85 pounds as a result of the procedure.  She was warned that nutritional deficiencies may be a part of this process.  She denies having any overt signs of bleeding such as nosebleeds, bruising, or dark stools.  She reports that she took iron pills "many years ago".  But they were causing GI upset.  She does that she has never received a blood transfusion or IV iron therapy.    The patient is unsure if she has undergone menopause because she underwent an ablation approximate 10 to 15 years ago and has not had any periods in that time.  She is currently on a specialized diet with strongly portioned items called to the [place].  She reports it does include meat as well as vegetables.  She otherwise denies any fevers, chills, sweats, nausea, or diarrhea.  A full 10 point ROS is established  MEDICAL HISTORY:  Past Medical History:  Diagnosis Date  . Abdominal pain of unknown etiology   . Anal fistula    hx of   . Aortic atherosclerosis ([place])   . Constipation    severe, history of  . DDD (degenerative disc disease), lumbar   . Depression   . Diarrhea    history of  . GERD (gastroesophageal reflux disease)   . History of hiatal hernia   . Hypothyroidism   . Iron deficiency anemia   . Left shoulder pain   . Low back pain   . Lumbar herniated disc   .  Neck pain   . Numbness of right lower extremity   . Olecranon bursitis   . Plantar fasciitis, left   . Prediabetes   . RA (rheumatoid arthritis) ([place])   . Right carpal tunnel syndrome 01/09/2018  . S/P colonoscopy April 2011   Dr. Olevia Perches: mild diverticulosis, hyperplastic polyps, repeat in 10 years  . S/P endoscopy April 2011   Dr. Olevia Perches: no hiatal hernia, generous opening to antrum., patent gastrojejunostomy  . Sciatic leg pain    Right  . Ulnar neuropathy at elbow, right 01/09/2018  . Vitamin D  deficiency   . Weakness of right lower extremity     SURGICAL HISTORY: Past Surgical History:  Procedure Laterality Date  . anal fistulostomy     w/marsupialization an excision of anal tags  . BACK SURGERY  10/2017  . BREAST LUMPECTOMY Right    benign  . CARPAL TUNNEL WITH CUBITAL TUNNEL Right 02/06/2018   Procedure: CARPAL TUNNEL RELEASE RIGHT TENOSYNOVECTOMY;  Surgeon: Daryll Brod, MD;  Location: [facility];  Service: Orthopedics;  Laterality: Right;  regional block  . COLONOSCOPY    . ENDOMETRIAL ABLATION    . ESOPHAGOGASTRODUODENOSCOPY    . GASTRIC BYPASS     mini gastric bypass in [place] reportedly  . ROTATOR CUFF REPAIR  07/2017  . ULNAR NERVE TRANSPOSITION Right 02/06/2018   Procedure: ULNAR NERVE DECOMPRESSION;  Surgeon: Daryll Brod, MD;  Location: [facility];  Service: Orthopedics;  Laterality: Right;  regional block   . ULNAR TUNNEL RELEASE Right 02/06/2018   Procedure: CUBITAL TUNNEL RELEASE;  Surgeon: Daryll Brod, MD;  Location: [facility];  Service: Orthopedics;  Laterality: Right;  regoinal block    SOCIAL HISTORY: Social History   Socioeconomic History  . Marital status: Married    Spouse name: Not on file  . Number of children: Not on file  . Years of education: Not on file  . Highest education level: Not on file  Occupational History  . Occupation: Merchant navy officer    Comment: Production designer, theatre/television/film   Tobacco Use  . Smoking status: Former Research scientist (life sciences)  . Smokeless tobacco: Never Used  . Tobacco comment: quit 2006  Vaping Use  . Vaping Use: Never used  Substance and Sexual Activity  . Alcohol use: Yes    Alcohol/week: 1.0 standard drink    Types: 1 Standard drinks or equivalent per week    Comment: social drinker  . Drug use: No  . Sexual activity: Yes    Partners: Male    Birth control/protection: None    Comment: spouse  Other Topics Concern  . Not on file  Social History Narrative  . Not on file   Social  Determinants of Health   Financial Resource Strain: Not on file  Food Insecurity: Not on file  Transportation Needs: Not on file  Physical Activity: Not on file  Stress: Not on file  Social Connections: Not on file  Intimate Partner Violence: Not on file    FAMILY HISTORY: Family History  Problem Relation Age of Onset  . COPD Mother        living  . Heart disease Father        deceased  . Colon cancer Neg Hx     ALLERGIES:  is allergic to penicillins.  MEDICATIONS:  Current Outpatient Medications  Medication Sig Dispense Refill  . Calcium Carbonate-Vitamin D (CALCIUM 600/VITAMIN D PO) Take 1 tablet by mouth daily.    . Cholecalciferol (VITAMIN  D3) 50 MCG (2000 UT) capsule Take 2,000 Units by mouth daily.     Marland Kitchen DHEA 50 MG CAPS Take 50 mg by mouth daily.    . folic acid (FOLVITE) 1 MG tablet Take 1 mg by mouth daily.  0  . HUMIRA PEN 40 MG/0.4ML PNKT SMARTSIG:40 Milligram(s) SUB-Q Every 2 Weeks    . levothyroxine (SYNTHROID) 88 MCG tablet Take 1 tablet (88 mcg total) by mouth daily. 90 tablet 1  . methotrexate (RHEUMATREX) 2.5 MG tablet Take 3 tablets (7.5 mg total) by mouth See admin instructions. Take 7.5 mg by mouth on Monday and Tuesday 4 tablet 0  . vitamin B-12 (CYANOCOBALAMIN) 1000 MCG tablet Take 1,000 mcg by mouth daily.     No current facility-administered medications for this visit.    REVIEW OF SYSTEMS:   Constitutional: ( - ) fevers, ( - )  chills , ( - ) night sweats Eyes: ( - ) blurriness of vision, ( - ) double vision, ( - ) watery eyes Ears, nose, mouth, throat, and face: ( - ) mucositis, ( - ) sore throat Respiratory: ( - ) cough, ( - ) dyspnea, ( - ) wheezes Cardiovascular: ( - ) palpitation, ( - ) chest discomfort, ( - ) lower extremity swelling Gastrointestinal:  ( - ) nausea, ( - ) heartburn, ( - ) change in bowel habits Skin: ( - ) abnormal skin rashes Lymphatics: ( - ) new lymphadenopathy, ( - ) easy bruising Neurological: ( - ) numbness, ( - )  tingling, ( - ) new weaknesses Behavioral/Psych: ( - ) mood change, ( - ) new changes  All other systems were reviewed with the patient and are negative.  PHYSICAL EXAMINATION:  Vitals:   10/30/20 1423  BP: 104/62  Pulse: 60  Resp: 18  Temp: (!) 97.1 F (36.2 C)  SpO2: 98%   Filed Weights   10/30/20 1423  Weight: 192 lb 12.8 oz (87.5 kg)    GENERAL: well appearing middle aged Caucasian female in NAD  SKIN: skin color, texture, turgor are normal, no rashes or significant lesions EYES: conjunctiva are pink and non-injected, sclera clear LUNGS: clear to auscultation and percussion with normal breathing effort HEART: regular rate & rhythm and no murmurs and no lower extremity edema Musculoskeletal: no cyanosis of digits and no clubbing  PSYCH: alert & oriented x 3, fluent speech NEURO: no focal motor/sensory deficits  LABORATORY DATA:  I have reviewed the data as listed CBC Latest Ref Rng & Units 10/30/2020 10/07/2020 03/18/2020  WBC 4.0 - 10.5 K/uL 6.6 5.2 8.9  Hemoglobin 12.0 - 15.0 g/dL 10.9(L) 10.7(L) 11.2(L)  Hematocrit 36.0 - 46.0 % 34.1(L) 34.1 35.7(L)  Platelets 150 - 400 K/uL 313 293 263    CMP Latest Ref Rng & Units 10/30/2020 03/18/2020 03/13/2020  Glucose 70 - 99 mg/dL 83 174(H) 118(H)  BUN 6 - 20 mg/dL 19 7 11   Creatinine 0.44 - 1.00 mg/dL 0.83 0.76 0.83  Sodium 135 - 145 mmol/L 136 137 139  Potassium 3.5 - 5.1 mmol/L 4.2 3.4(L) 4.0  Chloride 98 - 111 mmol/L 101 100 102  CO2 22 - 32 mmol/L 26 25 28   Calcium 8.9 - 10.3 mg/dL 9.4 9.2 9.5  Total Protein 6.5 - 8.1 g/dL 7.5 - -  Total Bilirubin 0.3 - 1.2 mg/dL 0.3 - -  Alkaline Phos 38 - 126 U/L 124 - -  AST 15 - 41 U/L 26 - -  ALT 0 - 44 U/L 26 - -  RADIOGRAPHIC STUDIES: No results found.  ASSESSMENT & PLAN [name] 60 y.o. female with medical history significant for gastric bipass (2008), rheumatoid arthritis on MTX, hypothyroidism, GERD, constipation, olecranon bursitis, degenerative disk disease  who presents for evaluation of iron deficiency anemia.  After review the labs, the records, discussion with the patient the findings are most consistent with iron deficiency anemia secondary to gastric bypass.  It is likely the patient has absorptive issue due to the many gastric bypass that she received.  As such I do believe it is appropriate to proceed with IV iron therapy at this time.  Of note she has had a colonoscopy in December 2021 which showed no overt signs or symptoms of a GI bleed.  We will plan to proceed with IV Feraheme 510 mg q. 7 days x 2 doses and have the patient return approximately 4 to 6 weeks after the last dose in order to reevaluate.  The patient voiced understanding of this plan moving forward  # Iron Deficiency Anemia 2/2 to Gastric Bipass -- Today we will reorder CBC, CMP, reticulocyte panel, iron panel, and ferritin. -- Findings from prior labs are most consistent with iron deficiency anemia in the setting of gastric bypass.  As such I recommend that we proceed with IV Feraheme 510 mg q. 7 days x 2 doses. --Most likely etiology for this patient's iron deficiency anemia is her gastric bypass and poor absorption of iron.  She has had a colonoscopy which showed no overt signs or symptoms of bleeding.  If she continues to have decreasing iron levels and anemia I would recommend consideration of upper GI endoscopy -- Once IV iron is complete we will have the patient return to clinic in approximately 4 to 6 weeks time  Orders Placed This Encounter  Procedures  . CBC with Differential (Cancer Center Only)    Standing Status:   Future    Number of Occurrences:   1    Standing Expiration Date:   10/30/2021  . CMP ([facility] only)    Standing Status:   Future    Number of Occurrences:   1    Standing Expiration Date:   10/30/2021  . Iron and TIBC    Standing Status:   Future    Number of Occurrences:   1    Standing Expiration Date:   10/30/2021  . Ferritin    Standing  Status:   Future    Number of Occurrences:   1    Standing Expiration Date:   10/30/2021  . Retic Panel    Standing Status:   Future    Number of Occurrences:   1    Standing Expiration Date:   10/30/2021    All questions were answered. The patient knows to call the clinic with any problems, questions or concerns.  A total of more than 60 minutes were spent on this encounter and over half of that time was spent on counseling and coordination of care as outlined above.   Ledell Peoples, MD Department of Hematology/Oncology [place] [facility] Phone: [phone] Pager: [phone] Email: [email or web address]  10/30/2020 3:48 PM

## 2020-11-02 ENCOUNTER — Ambulatory Visit: Payer: 59 | Admitting: Family Medicine

## 2020-11-02 ENCOUNTER — Telehealth: Payer: Self-pay | Admitting: *Deleted

## 2020-11-02 LAB — IRON AND TIBC
Iron: 23 ug/dL — ABNORMAL LOW (ref 41–142)
Saturation Ratios: 4 % — ABNORMAL LOW (ref 21–57)
TIBC: 514 ug/dL — ABNORMAL HIGH (ref 236–444)
UIBC: 491 ug/dL — ABNORMAL HIGH (ref 120–384)

## 2020-11-02 LAB — FERRITIN: Ferritin: 9 ng/mL — ABNORMAL LOW (ref 11–307)

## 2020-11-02 NOTE — Telephone Encounter (Signed)
-----   Message from Orson Slick, MD sent at 11/02/2020  1:44 PM EST ----- Please let [name] know that her labs are strongly consistent with iron deficiency anemia, likely 2/2 to her gastric bipass and poor iron absorption. I would like to have her scheduled for 2 doses of IV feraheme q 7 days x 2 doses and a follow up visit 4-6 weeks later. This has not yet been scheduled. Please have her call us if these visits have not been scheduled in the next few days.   ----- Message ----- From: Buel Ream, Lab In [place] Sent: 10/30/2020   3:13 PM EST To: Orson Slick, MD

## 2020-11-02 NOTE — Telephone Encounter (Signed)
[name] patient regarding her recent lab results. Spoke with pt and advised that she does have iron deficiency anemia.  Dr. Lorenso Courier recommends IV x2, a week apart.  Advised to expect a call from our schedulers sometime this week. Instructed to call back on Friday if she does not hear from the scheduler. She voiced understanding.

## 2020-11-09 ENCOUNTER — Telehealth: Payer: Self-pay | Admitting: Hematology and Oncology

## 2020-11-09 NOTE — Telephone Encounter (Signed)
Called to inform patient of her upcoming appointment. Patient is aware. 

## 2020-11-10 NOTE — Progress Notes (Signed)
Intravenous Iron Formulation Change  [name] has insurance that requires a change in intravenous iron product from Feraheme to Venofer. Orders have been updated to reflect this change and scheduling message sent to adjust infusion appointments. Dr Lorenso Courier notified and agrees with the plan.  Allergies:  Allergies  Allergen Reactions  . Penicillins Itching    The plan for iron therapy is as follows: Venofer 200 mg IVPB x 5 doses.  Elsie Lincoln, PharmD 11/10/2020

## 2020-11-11 ENCOUNTER — Ambulatory Visit (HOSPITAL_COMMUNITY)
Admission: RE | Admit: 2020-11-11 | Discharge: 2020-11-11 | Disposition: A | Discharge status: Home / Self Care | Payer: 59 | Source: Ambulatory Visit | Attending: Family Medicine | Admitting: Family Medicine

## 2020-11-11 ENCOUNTER — Other Ambulatory Visit: Payer: Self-pay

## 2020-11-11 ENCOUNTER — Encounter: Payer: Self-pay | Admitting: Family Medicine

## 2020-11-11 ENCOUNTER — Ambulatory Visit: Payer: 59 | Admitting: Family Medicine

## 2020-11-11 VITALS — BP 125/79 | HR 60 | Ht 64.0 in | Wt 192.0 lb

## 2020-11-11 DIAGNOSIS — R1031 Right lower quadrant pain: Secondary | ICD-10-CM | POA: Diagnosis not present

## 2020-11-11 DIAGNOSIS — M25551 Pain in right hip: Secondary | ICD-10-CM | POA: Diagnosis present

## 2020-11-11 MED ORDER — PREDNISONE 20 MG PO TABS: ORAL_TABLET | ORAL | 0 refills | Status: DC

## 2020-11-11 MED ORDER — HYDROCODONE-ACETAMINOPHEN 5-325 MG PO TABS: 1.0000 | ORAL_TABLET | Freq: Four times a day (QID) | ORAL | 0 refills | Status: DC | PRN

## 2020-11-11 NOTE — Progress Notes (Signed)
   Subjective:    Patient ID: [name], female    DOB: 1961/04/19, 60 y.o.   MRN: [id number]  HPI Pain in groin area- worked 12 hours yesterday- started feeling a little pain in groin near end of shift but woke up this am with severe pain in groin area- hurts to walk and sit -?pulled muscle Severe pain very difficult to walk having to use a walker to get around having severe disability because of this has history of rheumatoid arthritis denies any recent injuries or other setbacks Please see above Review of Systems See above denies fever chills sweats    Objective:   Physical Exam Lower legs nonswollen knee normal calf normal thigh normal increased pain with internal rotation external rotation of the hip tenderness in the groin region no tenderness in the back       Assessment & Plan:  Hip bursitis versus arthritic issues Need to do x-ray to rule out the possibility of a fracture Prednisone taper if x-ray negative Follow-up with rheumatology if ongoing troubles   Addendum x-ray showed arthritic changes but no pelvic or hip fracture

## 2020-11-12 ENCOUNTER — Telehealth: Payer: Self-pay | Admitting: Hematology and Oncology

## 2020-11-12 NOTE — Telephone Encounter (Signed)
Added infusion appointments per 1/26 schedule message. Patient is aware.

## 2020-11-13 ENCOUNTER — Encounter: Payer: Self-pay | Admitting: Family Medicine

## 2020-11-13 ENCOUNTER — Telehealth: Payer: Self-pay | Admitting: *Deleted

## 2020-11-13 ENCOUNTER — Telehealth (INDEPENDENT_AMBULATORY_CARE_PROVIDER_SITE_OTHER): Payer: 59 | Admitting: Family Medicine

## 2020-11-13 ENCOUNTER — Inpatient Hospital Stay: Payer: 59

## 2020-11-13 ENCOUNTER — Other Ambulatory Visit: Payer: Self-pay

## 2020-11-13 VITALS — BP 97/67 | HR 57 | Temp 97.9°F | Resp 16

## 2020-11-13 DIAGNOSIS — M25551 Pain in right hip: Secondary | ICD-10-CM

## 2020-11-13 DIAGNOSIS — E038 Other specified hypothyroidism: Secondary | ICD-10-CM | POA: Diagnosis not present

## 2020-11-13 DIAGNOSIS — D508 Other iron deficiency anemias: Secondary | ICD-10-CM

## 2020-11-13 DIAGNOSIS — D509 Iron deficiency anemia, unspecified: Secondary | ICD-10-CM

## 2020-11-13 MED ORDER — ACETAMINOPHEN 325 MG PO TABS
ORAL_TABLET | ORAL | Status: AC
  Filled 2020-11-13: qty 2

## 2020-11-13 MED ORDER — SODIUM CHLORIDE 0.9 % IV SOLN
Freq: Once | INTRAVENOUS | Status: AC
  Filled 2020-11-13: qty 250

## 2020-11-13 MED ORDER — LEVOTHYROXINE SODIUM 88 MCG PO TABS
88.0000 ug | ORAL_TABLET | Freq: Every day | ORAL | 3 refills | Status: DC
Start: 2020-11-13 — End: 2021-01-08

## 2020-11-13 MED ORDER — ACETAMINOPHEN 325 MG PO TABS
650.0000 mg | ORAL_TABLET | Freq: Once | ORAL | Status: AC
  Administered 2020-11-13: 650 mg via ORAL

## 2020-11-13 MED ORDER — SODIUM CHLORIDE 0.9 % IV SOLN
200.0000 mg | Freq: Once | INTRAVENOUS | Status: AC
  Administered 2020-11-13: 200 mg via INTRAVENOUS
  Filled 2020-11-13: qty 200

## 2020-11-13 MED ORDER — LORATADINE 10 MG PO TABS
ORAL_TABLET | ORAL | Status: AC
  Filled 2020-11-13: qty 1

## 2020-11-13 MED ORDER — LORATADINE 10 MG PO TABS
10.0000 mg | ORAL_TABLET | Freq: Once | ORAL | Status: AC
  Administered 2020-11-13: 10 mg via ORAL

## 2020-11-13 NOTE — Progress Notes (Signed)
   Subjective:    Patient ID: [name], female    DOB: 02/01/1961, 60 y.o.   MRN: [id number]  HPI  Patient calls for a follow up on thyroid.  Her energy level doing well she does take her medicine early in the morning she is not currently having any trouble her lab work overall looks good  Patient had iron deficient anemia and they gave her an infusion they state they are going to follow this again in several months if it still goes down the recommending an EGD  Patient also states her hip is doing better on prednisone.  She relates the hip pain that we saw her for the other day is markedly better on the prednisone she states she is feeling much better with better range of motion and no pain  Virtual Visit via Telephone Note  I connected with Mabeline Caras on 11/13/20 at 11:00 AM EST by telephone and verified that I am speaking with the correct person using two identifiers.  Location: Patient: home Provider: office   I discussed the limitations, risks, security and privacy concerns of performing an evaluation and management service by telephone and the availability of in person appointments. I also discussed with the patient that there may be a patient responsible charge related to this service. The patient expressed understanding and agreed to proceed.   History of Present Illness:    Observations/Objective:   Assessment and Plan:   Follow Up Instructions:    I discussed the assessment and treatment plan with the patient. The patient was provided an opportunity to ask questions and all were answered. The patient agreed with the plan and demonstrated an understanding of the instructions.   The patient was advised to call back or seek an in-person evaluation if the symptoms worsen or if the condition fails to improve as anticipated.  I provided 20 including Documentation time spent with patient minutes of non-face-to-face time during this encounter.      Review of  Systems    Denies hip pain denies fever chills denies fatigue tiredness Objective:   Physical Exam  Today's visit was via telephone Physical exam was not possible for this visit       Assessment & Plan:  1. Right hip pain Hip is doing much better x-ray did show some osteoarthritis going on if she starts having repetitive spells she needs to bring this to the attention rheumatology for further evaluation she can cut back on the prednisone to 2 tablets today and tomorrow and Sunday then 1 tablet the day on Monday Tuesday Wednesday certainly follow-up sooner if any problems  2. Iron deficiency anemia, unspecified iron deficiency anemia type She will follow up with a specialist may need EGD  3. Other specified hypothyroidism Continue thyroid medicine refills given follow-up again in 1 years time sooner if any problems warning signs discussed in detail  Very important for the patient to keep up with female health checkups  Patient did have a colonoscopy with Dr. Paulita Fujita in December and is supposed to have another one in 5 years

## 2020-11-13 NOTE — Telephone Encounter (Signed)
Ms. denna, fryberger are scheduled for a virtual visit with your provider today.    Just as we do with appointments in the office, we must obtain your consent to participate.  Your consent will be active for this visit and any virtual visit you may have with one of our providers in the next 365 days.    If you have a MyChart account, I can also send a copy of this consent to you electronically.  All virtual visits are billed to your insurance company just like a traditional visit in the office.  As this is a virtual visit, video technology does not allow for your provider to perform a traditional examination.  This may limit your provider's ability to fully assess your condition.  If your provider identifies any concerns that need to be evaluated in person or the need to arrange testing such as labs, EKG, etc, we will make arrangements to do so.    Although advances in technology are sophisticated, we cannot ensure that it will always work on either your end or our end.  If the connection with a video visit is poor, we may have to switch to a telephone visit.  With either a video or telephone visit, we are not always able to ensure that we have a secure connection.   I need to obtain your verbal consent now.   Are you willing to proceed with your visit today?   ASHLA MURPH has provided verbal consent on 11/13/2020 for a virtual visit (video or telephone).   Mitzie Na, RN 11/13/2020  9:07 AM

## 2020-11-13 NOTE — Patient Instructions (Signed)

## 2020-11-20 ENCOUNTER — Inpatient Hospital Stay: Payer: 59 | Attending: Hematology and Oncology

## 2020-11-20 ENCOUNTER — Other Ambulatory Visit: Payer: Self-pay

## 2020-11-20 VITALS — BP 99/57 | HR 56 | Temp 98.2°F | Resp 16

## 2020-11-20 DIAGNOSIS — Z9884 Bariatric surgery status: Secondary | ICD-10-CM | POA: Diagnosis not present

## 2020-11-20 DIAGNOSIS — E039 Hypothyroidism, unspecified: Secondary | ICD-10-CM | POA: Insufficient documentation

## 2020-11-20 DIAGNOSIS — Z836 Family history of other diseases of the respiratory system: Secondary | ICD-10-CM | POA: Diagnosis not present

## 2020-11-20 DIAGNOSIS — Z8249 Family history of ischemic heart disease and other diseases of the circulatory system: Secondary | ICD-10-CM | POA: Insufficient documentation

## 2020-11-20 DIAGNOSIS — D508 Other iron deficiency anemias: Secondary | ICD-10-CM | POA: Insufficient documentation

## 2020-11-20 DIAGNOSIS — Z79899 Other long term (current) drug therapy: Secondary | ICD-10-CM | POA: Insufficient documentation

## 2020-11-20 DIAGNOSIS — Z87891 Personal history of nicotine dependence: Secondary | ICD-10-CM | POA: Insufficient documentation

## 2020-11-20 DIAGNOSIS — K219 Gastro-esophageal reflux disease without esophagitis: Secondary | ICD-10-CM | POA: Diagnosis not present

## 2020-11-20 MED ORDER — SODIUM CHLORIDE 0.9 % IV SOLN
Freq: Once | INTRAVENOUS | Status: AC
  Filled 2020-11-20: qty 250

## 2020-11-20 MED ORDER — ACETAMINOPHEN 325 MG PO TABS
650.0000 mg | ORAL_TABLET | Freq: Once | ORAL | Status: AC
  Administered 2020-11-20: 650 mg via ORAL

## 2020-11-20 MED ORDER — ACETAMINOPHEN 325 MG PO TABS
ORAL_TABLET | ORAL | Status: AC
  Filled 2020-11-20: qty 2

## 2020-11-20 MED ORDER — LORATADINE 10 MG PO TABS
ORAL_TABLET | ORAL | Status: AC
  Filled 2020-11-20: qty 1

## 2020-11-20 MED ORDER — LORATADINE 10 MG PO TABS
10.0000 mg | ORAL_TABLET | Freq: Once | ORAL | Status: AC
  Administered 2020-11-20: 10 mg via ORAL

## 2020-11-20 MED ORDER — SODIUM CHLORIDE 0.9 % IV SOLN
200.0000 mg | Freq: Once | INTRAVENOUS | Status: AC
  Administered 2020-11-20: 200 mg via INTRAVENOUS
  Filled 2020-11-20: qty 200

## 2020-11-22 ENCOUNTER — Other Ambulatory Visit: Payer: Self-pay | Admitting: Family Medicine

## 2020-11-24 ENCOUNTER — Encounter: Payer: Self-pay | Admitting: Family Medicine

## 2020-11-27 ENCOUNTER — Inpatient Hospital Stay: Payer: 59

## 2020-11-27 ENCOUNTER — Other Ambulatory Visit: Payer: Self-pay

## 2020-11-27 VITALS — BP 107/71 | HR 53 | Temp 98.1°F | Resp 17 | Wt 199.5 lb

## 2020-11-27 DIAGNOSIS — D508 Other iron deficiency anemias: Secondary | ICD-10-CM

## 2020-11-27 MED ORDER — ACETAMINOPHEN 325 MG PO TABS
ORAL_TABLET | ORAL | Status: AC
  Filled 2020-11-27: qty 2

## 2020-11-27 MED ORDER — SODIUM CHLORIDE 0.9 % IV SOLN
Freq: Once | INTRAVENOUS | Status: AC
  Filled 2020-11-27: qty 250

## 2020-11-27 MED ORDER — ACETAMINOPHEN 325 MG PO TABS
650.0000 mg | ORAL_TABLET | Freq: Once | ORAL | Status: AC
  Administered 2020-11-27: 650 mg via ORAL

## 2020-11-27 MED ORDER — IRON SUCROSE 20 MG/ML IV SOLN
200.0000 mg | Freq: Once | INTRAVENOUS | Status: AC
  Administered 2020-11-27: 200 mg via INTRAVENOUS
  Filled 2020-11-27: qty 200

## 2020-11-27 MED ORDER — LORATADINE 10 MG PO TABS
10.0000 mg | ORAL_TABLET | Freq: Once | ORAL | Status: AC
  Administered 2020-11-27: 10 mg via ORAL

## 2020-11-27 MED ORDER — LORATADINE 10 MG PO TABS
ORAL_TABLET | ORAL | Status: AC
  Filled 2020-11-27: qty 1

## 2020-11-27 NOTE — Patient Instructions (Signed)

## 2020-12-04 ENCOUNTER — Inpatient Hospital Stay: Payer: 59

## 2020-12-04 ENCOUNTER — Other Ambulatory Visit: Payer: Self-pay

## 2020-12-04 VITALS — BP 122/78 | HR 58 | Temp 98.3°F | Resp 17

## 2020-12-04 DIAGNOSIS — D508 Other iron deficiency anemias: Secondary | ICD-10-CM

## 2020-12-04 MED ORDER — SODIUM CHLORIDE 0.9 % IV SOLN
Freq: Once | INTRAVENOUS | Status: AC
  Filled 2020-12-04: qty 250

## 2020-12-04 MED ORDER — SODIUM CHLORIDE 0.9 % IV SOLN
200.0000 mg | Freq: Once | INTRAVENOUS | Status: AC
  Administered 2020-12-04: 200 mg via INTRAVENOUS
  Filled 2020-12-04: qty 10

## 2020-12-04 MED ORDER — ACETAMINOPHEN 325 MG PO TABS
650.0000 mg | ORAL_TABLET | Freq: Once | ORAL | Status: AC
  Administered 2020-12-04: 650 mg via ORAL

## 2020-12-04 MED ORDER — LORATADINE 10 MG PO TABS
10.0000 mg | ORAL_TABLET | Freq: Once | ORAL | Status: AC
Start: 2020-12-04 — End: 2020-12-04
  Administered 2020-12-04: 10 mg via ORAL

## 2020-12-04 NOTE — Patient Instructions (Signed)

## 2020-12-04 NOTE — Progress Notes (Signed)
Patient declined to stay for 30 minute post-observation period but remained for 10 minutes post infusion. Upon discharge, vitals were stable, patient was ambulatory and with no complaints.

## 2020-12-11 ENCOUNTER — Other Ambulatory Visit: Payer: Self-pay

## 2020-12-11 ENCOUNTER — Inpatient Hospital Stay: Payer: 59

## 2020-12-11 VITALS — BP 109/53 | HR 55 | Temp 98.6°F | Resp 18

## 2020-12-11 DIAGNOSIS — D508 Other iron deficiency anemias: Secondary | ICD-10-CM | POA: Diagnosis not present

## 2020-12-11 MED ORDER — IRON SUCROSE 20 MG/ML IV SOLN
200.0000 mg | Freq: Once | INTRAVENOUS | Status: AC
  Administered 2020-12-11: 200 mg via INTRAVENOUS
  Filled 2020-12-11: qty 200

## 2020-12-11 MED ORDER — SODIUM CHLORIDE 0.9 % IV SOLN
Freq: Once | INTRAVENOUS | Status: AC
  Filled 2020-12-11: qty 250

## 2020-12-11 MED ORDER — ACETAMINOPHEN 325 MG PO TABS
650.0000 mg | ORAL_TABLET | Freq: Once | ORAL | Status: AC
  Administered 2020-12-11: 650 mg via ORAL

## 2020-12-11 MED ORDER — LORATADINE 10 MG PO TABS
ORAL_TABLET | ORAL | Status: AC
  Filled 2020-12-11: qty 1

## 2020-12-11 MED ORDER — LORATADINE 10 MG PO TABS
10.0000 mg | ORAL_TABLET | Freq: Once | ORAL | Status: AC
  Administered 2020-12-11: 10 mg via ORAL

## 2020-12-11 MED ORDER — ACETAMINOPHEN 325 MG PO TABS
ORAL_TABLET | ORAL | Status: AC
  Filled 2020-12-11: qty 2

## 2020-12-11 NOTE — Patient Instructions (Signed)

## 2020-12-17 ENCOUNTER — Other Ambulatory Visit: Payer: Self-pay | Admitting: Hematology and Oncology

## 2020-12-17 DIAGNOSIS — D500 Iron deficiency anemia secondary to blood loss (chronic): Secondary | ICD-10-CM

## 2020-12-17 DIAGNOSIS — D508 Other iron deficiency anemias: Secondary | ICD-10-CM

## 2020-12-17 NOTE — Progress Notes (Signed)
[place] Telephone:(336) [phone]   Fax:(336) [phone]  PROGRESS NOTE  Patient Care Team: Kathyrn Drown, MD as PCP - General (Family Medicine) Danie Binder, MD (Inactive) (Gastroenterology) Lahoma Rocker, MD as Consulting Physician (Rheumatology)  Hematological/Oncological History # Iron Deficiency Anemia 2/2 to Gastric Bipass 10/07/2020: WBC 5.2, Hgb 10.7, MCV 81, Plt 293. Iron 30, TIBC 447, Ferritin 9, Iron sat 7%.  10/30/2020: establish care with Dr. Lorenso Courier  11/13/2020-12/11/2020: 5 doses of IV iron sucrose 200mg  weekly 12/18/2020: WBC 4.5, Hgb 12.2, MCV 83.2, Plt 244  Interval History:  [name] 60 y.o. female with medical history significant for iron deficiency anemia 2/2 to Gastric bipass who presents for a follow up visit. The patient's last visit was on 10/30/2020 at which time she establish care. In the interim since the last visit she underwent 5 doses of IV iron sucrose.  On exam today [name] ports that she is no longer having any ice cravings and that she is not having as much shortness of breath on exertion. She continues to have low energy levels and is disappointed that the IV iron did not improve those symptoms. She has had a nice bump in her hemoglobin up to 12.2. Is gained about 4 pounds in the interim since her last visit. She otherwise denies any fevers, chills, sweats, nausea, vomiting or diarrhea. She has had no overt signs of bleeding, bruising, or dark stools. She has not made an attempt to increase her diet and include any more iron rich foods. A full 10 point ROS is listed below.  MEDICAL HISTORY:  Past Medical History:  Diagnosis Date  . Abdominal pain of unknown etiology   . Anal fistula    hx of   . Aortic atherosclerosis ([place])   . Constipation    severe, history of  . DDD (degenerative disc disease), lumbar   . Depression   . Diarrhea    history of  . GERD (gastroesophageal reflux disease)   . History of hiatal hernia    . Hypothyroidism   . Iron deficiency anemia   . Left shoulder pain   . Low back pain   . Lumbar herniated disc   . Neck pain   . Numbness of right lower extremity   . Olecranon bursitis   . Plantar fasciitis, left   . Prediabetes   . RA (rheumatoid arthritis) ([place])   . Right carpal tunnel syndrome 01/09/2018  . S/P colonoscopy April 2011   Dr. Olevia Perches: mild diverticulosis, hyperplastic polyps, repeat in 10 years  . S/P endoscopy April 2011   Dr. Olevia Perches: no hiatal hernia, generous opening to antrum., patent gastrojejunostomy  . Sciatic leg pain    Right  . Ulnar neuropathy at elbow, right 01/09/2018  . Vitamin D deficiency   . Weakness of right lower extremity     SURGICAL HISTORY: Past Surgical History:  Procedure Laterality Date  . anal fistulostomy     w/marsupialization an excision of anal tags  . BACK SURGERY  10/2017  . BREAST LUMPECTOMY Right    benign  . CARPAL TUNNEL WITH CUBITAL TUNNEL Right 02/06/2018   Procedure: CARPAL TUNNEL RELEASE RIGHT TENOSYNOVECTOMY;  Surgeon: Daryll Brod, MD;  Location: [place];  Service: Orthopedics;  Laterality: Right;  regional block  . COLONOSCOPY    . ENDOMETRIAL ABLATION    . ESOPHAGOGASTRODUODENOSCOPY    . GASTRIC BYPASS     mini gastric bypass in [place] reportedly  . ROTATOR  CUFF REPAIR  07/2017  . ULNAR NERVE TRANSPOSITION Right 02/06/2018   Procedure: ULNAR NERVE DECOMPRESSION;  Surgeon: Daryll Brod, MD;  Location: [facility];  Service: Orthopedics;  Laterality: Right;  regional block   . ULNAR TUNNEL RELEASE Right 02/06/2018   Procedure: CUBITAL TUNNEL RELEASE;  Surgeon: Daryll Brod, MD;  Location: [facility];  Service: Orthopedics;  Laterality: Right;  regoinal block    SOCIAL HISTORY: Social History   Socioeconomic History  . Marital status: Married    Spouse name: Not on file  . Number of children: Not on file  . Years of education: Not on file  . Highest education  level: Not on file  Occupational History  . Occupation: Merchant navy officer    Comment: Production designer, theatre/television/film   Tobacco Use  . Smoking status: Former Research scientist (life sciences)  . Smokeless tobacco: Never Used  . Tobacco comment: quit 2006  Vaping Use  . Vaping Use: Never used  Substance and Sexual Activity  . Alcohol use: Yes    Alcohol/week: 1.0 standard drink    Types: 1 Standard drinks or equivalent per week    Comment: social drinker  . Drug use: No  . Sexual activity: Yes    Partners: Male    Birth control/protection: None    Comment: spouse  Other Topics Concern  . Not on file  Social History Narrative  . Not on file   Social Determinants of Health   Financial Resource Strain: Not on file  Food Insecurity: Not on file  Transportation Needs: Not on file  Physical Activity: Not on file  Stress: Not on file  Social Connections: Not on file  Intimate Partner Violence: Not on file    FAMILY HISTORY: Family History  Problem Relation Age of Onset  . COPD Mother        living  . Heart disease Father        deceased  . Colon cancer Neg Hx     ALLERGIES:  is allergic to penicillins.  MEDICATIONS:  Current Outpatient Medications  Medication Sig Dispense Refill  . Calcium Carbonate-Vitamin D (CALCIUM 600/VITAMIN D PO) Take 1 tablet by mouth daily.    . Cholecalciferol (VITAMIN D3) 50 MCG (2000 UT) capsule Take 2,000 Units by mouth daily.    Marland Kitchen DHEA 50 MG CAPS Take 50 mg by mouth daily.    . folic acid (FOLVITE) 1 MG tablet Take 1 mg by mouth daily.  0  . HUMIRA PEN 40 MG/0.4ML PNKT SMARTSIG:40 Milligram(s) SUB-Q Every 2 Weeks    . levothyroxine (SYNTHROID) 88 MCG tablet Take 1 tablet (88 mcg total) by mouth daily. 90 tablet 3  . methotrexate (RHEUMATREX) 2.5 MG tablet Take 3 tablets (7.5 mg total) by mouth See admin instructions. Take 7.5 mg by mouth on Monday and Tuesday 4 tablet 0  . vitamin B-12 (CYANOCOBALAMIN) 1000 MCG tablet Take 1,000 mcg by mouth daily.    Marland Kitchen HYDROcodone-acetaminophen  (NORCO/VICODIN) 5-325 MG tablet Take 1 tablet by mouth every 6 (six) hours as needed. (Patient not taking: Reported on 12/18/2020) 30 tablet 0   No current facility-administered medications for this visit.    REVIEW OF SYSTEMS:   Constitutional: ( - ) fevers, ( - )  chills , ( - ) night sweats Eyes: ( - ) blurriness of vision, ( - ) double vision, ( - ) watery eyes Ears, nose, mouth, throat, and face: ( - ) mucositis, ( - ) sore throat Respiratory: ( - )  cough, ( - ) dyspnea, ( - ) wheezes Cardiovascular: ( - ) palpitation, ( - ) chest discomfort, ( - ) lower extremity swelling Gastrointestinal:  ( - ) nausea, ( - ) heartburn, ( - ) change in bowel habits Skin: ( - ) abnormal skin rashes Lymphatics: ( - ) new lymphadenopathy, ( - ) easy bruising Neurological: ( - ) numbness, ( - ) tingling, ( - ) new weaknesses Behavioral/Psych: ( - ) mood change, ( - ) new changes  All other systems were reviewed with the patient and are negative.  PHYSICAL EXAMINATION:  Vitals:   12/18/20 1438  BP: 107/65  Pulse: (!) 56  Resp: 17  Temp: 98.3 F (36.8 C)  SpO2: 100%   Filed Weights   12/18/20 1438  Weight: 196 lb 11.2 oz (89.2 kg)    GENERAL: well appearing middle aged Caucasian female alert, no distress and comfortable SKIN: skin color, texture, turgor are normal, no rashes or significant lesions EYES: conjunctiva are pink and non-injected, sclera clear LUNGS: clear to auscultation and percussion with normal breathing effort HEART: regular rate & rhythm and no murmurs and no lower extremity edema Musculoskeletal: no cyanosis of digits and no clubbing  PSYCH: alert & oriented x 3, fluent speech NEURO: no focal motor/sensory deficits  LABORATORY DATA:  I have reviewed the data as listed CBC Latest Ref Rng & Units 12/18/2020 10/30/2020 10/07/2020  WBC 4.0 - 10.5 K/uL 4.5 6.6 5.2  Hemoglobin 12.0 - 15.0 g/dL 12.2 10.9(L) 10.7(L)  Hematocrit 36.0 - 46.0 % 37.1 34.1(L) 34.1  Platelets 150 -  400 K/uL 244 313 293    CMP Latest Ref Rng & Units 12/18/2020 10/30/2020 03/18/2020  Glucose 70 - 99 mg/dL 93 83 174(H)  BUN 6 - 20 mg/dL 19 19 7   Creatinine 0.44 - 1.00 mg/dL 0.78 0.83 0.76  Sodium 135 - 145 mmol/L 132(L) 136 137  Potassium 3.5 - 5.1 mmol/L 3.9 4.2 3.4(L)  Chloride 98 - 111 mmol/L 97(L) 101 100  CO2 22 - 32 mmol/L 25 26 25   Calcium 8.9 - 10.3 mg/dL 9.1 9.4 9.2  Total Protein 6.5 - 8.1 g/dL 7.3 7.5 -  Total Bilirubin 0.3 - 1.2 mg/dL 0.4 0.3 -  Alkaline Phos 38 - 126 U/L 113 124 -  AST 15 - 41 U/L 34 26 -  ALT 0 - 44 U/L 39 26 -    RADIOGRAPHIC STUDIES: No results found.  ASSESSMENT & PLAN [name] 60 y.o. female with medical history significant for iron deficiency anemia 2/2 to Gastric bipass who presents for a follow up visit.   After review the labs, the records, schedule the patient the findings most consistent with iron deficiency anemia secondary to a gastric bypass. She received the IV sucrose treatment x5 doses and had a increase in her hemoglobin up to 12.2. Additionally her ferritin is above goal at 183 and her TIBC is returned to normal. Her iron sat is still slightly low at 19%, but overall she is had an excellent response to the IV iron therapy. Given that the likely cause is poor absorption due to her gastric bypass we recommended she follow-up in 3 months time in order to assure that her iron stores are still replete. We will need at least periodic follow-up with her every 6 to 12 months in order to assure her iron levels are at goal.  # Iron Deficiency Anemia 2/2 to Gastric Bipass --patient completed 5 doses of IV iron sucrose 200mg   weekly from 11/13/2020-12/11/2020 --Hgb has improved and most symptoms have resolved. Patient does have a persistent lack of energy --due to gastric bipass may require future IV iron treaments --recommend RTC in 3 months time to assure her iron stores remain replete.   No orders of the defined types were placed in this  encounter.   All questions were answered. The patient knows to call the clinic with any problems, questions or concerns.  A total of more than 30 minutes were spent on this encounter and over half of that time was spent on counseling and coordination of care as outlined above.   Ledell Peoples, MD Department of Hematology/Oncology [place] at [facility] Phone: [phone] Pager: [phone] Email: [email or web address]  12/22/2020 7:22 PM

## 2020-12-18 ENCOUNTER — Inpatient Hospital Stay: Payer: 59

## 2020-12-18 ENCOUNTER — Other Ambulatory Visit: Payer: Self-pay

## 2020-12-18 ENCOUNTER — Inpatient Hospital Stay: Payer: 59 | Attending: Hematology and Oncology | Admitting: Hematology and Oncology

## 2020-12-18 VITALS — BP 107/65 | HR 56 | Temp 98.3°F | Resp 17 | Ht 64.0 in | Wt 196.7 lb

## 2020-12-18 DIAGNOSIS — R5383 Other fatigue: Secondary | ICD-10-CM | POA: Diagnosis not present

## 2020-12-18 DIAGNOSIS — Z87891 Personal history of nicotine dependence: Secondary | ICD-10-CM | POA: Insufficient documentation

## 2020-12-18 DIAGNOSIS — Z880 Allergy status to penicillin: Secondary | ICD-10-CM | POA: Insufficient documentation

## 2020-12-18 DIAGNOSIS — D508 Other iron deficiency anemias: Secondary | ICD-10-CM | POA: Insufficient documentation

## 2020-12-18 DIAGNOSIS — Z8249 Family history of ischemic heart disease and other diseases of the circulatory system: Secondary | ICD-10-CM | POA: Insufficient documentation

## 2020-12-18 DIAGNOSIS — Z8719 Personal history of other diseases of the digestive system: Secondary | ICD-10-CM | POA: Diagnosis not present

## 2020-12-18 DIAGNOSIS — D500 Iron deficiency anemia secondary to blood loss (chronic): Secondary | ICD-10-CM

## 2020-12-18 DIAGNOSIS — Z79899 Other long term (current) drug therapy: Secondary | ICD-10-CM | POA: Diagnosis not present

## 2020-12-18 DIAGNOSIS — Z836 Family history of other diseases of the respiratory system: Secondary | ICD-10-CM | POA: Insufficient documentation

## 2020-12-18 DIAGNOSIS — Z9884 Bariatric surgery status: Secondary | ICD-10-CM | POA: Insufficient documentation

## 2020-12-18 LAB — CMP (CANCER CENTER ONLY)
ALT: 39 U/L (ref 0–44)
AST: 34 U/L (ref 15–41)
Albumin: 4.1 g/dL (ref 3.5–5.0)
Alkaline Phosphatase: 113 U/L (ref 38–126)
Anion gap: 10 (ref 5–15)
BUN: 19 mg/dL (ref 6–20)
CO2: 25 mmol/L (ref 22–32)
Calcium: 9.1 mg/dL (ref 8.9–10.3)
Chloride: 97 mmol/L — ABNORMAL LOW (ref 98–111)
Creatinine: 0.78 mg/dL (ref 0.44–1.00)
GFR, Estimated: >60 mL/min (ref >60)
Glucose, Bld: 93 mg/dL (ref 70–99)
Potassium: 3.9 mmol/L (ref 3.5–5.1)
Sodium: 132 mmol/L — ABNORMAL LOW (ref 135–145)
Total Bilirubin: 0.4 mg/dL (ref 0.3–1.2)
Total Protein: 7.3 g/dL (ref 6.5–8.1)

## 2020-12-18 LAB — CBC WITH DIFFERENTIAL (CANCER CENTER ONLY)
Abs Immature Granulocytes: 0.01 10*3/uL (ref 0.00–0.07)
Basophils Absolute: 0 10*3/uL (ref 0.0–0.1)
Basophils Relative: 1 %
Eosinophils Absolute: 0.1 10*3/uL (ref 0.0–0.5)
Eosinophils Relative: 2 %
HCT: 37.1 % (ref 36.0–46.0)
Hemoglobin: 12.2 g/dL (ref 12.0–15.0)
Immature Granulocytes: 0 %
Lymphocytes Relative: 38 %
Lymphs Abs: 1.7 10*3/uL (ref 0.7–4.0)
MCH: 27.4 pg (ref 26.0–34.0)
MCHC: 32.9 g/dL (ref 30.0–36.0)
MCV: 83.2 fL (ref 80.0–100.0)
Monocytes Absolute: 0.5 10*3/uL (ref 0.1–1.0)
Monocytes Relative: 11 %
Neutro Abs: 2.1 10*3/uL (ref 1.7–7.7)
Neutrophils Relative %: 48 %
Platelet Count: 244 10*3/uL (ref 150–400)
RBC: 4.46 MIL/uL (ref 3.87–5.11)
RDW: 22.5 % — ABNORMAL HIGH (ref 11.5–15.5)
WBC Count: 4.5 10*3/uL (ref 4.0–10.5)
nRBC: 0 % (ref 0.0–0.2)

## 2020-12-18 LAB — RETIC PANEL
Immature Retic Fract: 9.3 % (ref 2.3–15.9)
RBC.: 4.51 MIL/uL (ref 3.87–5.11)
Retic Count, Absolute: 62.7 10*3/uL (ref 19.0–186.0)
Retic Ct Pct: 1.4 % (ref 0.4–3.1)
Reticulocyte Hemoglobin: 37.9 pg (ref >27.9)

## 2020-12-21 LAB — IRON AND TIBC
Iron: 75 ug/dL (ref 41–142)
Saturation Ratios: 19 % — ABNORMAL LOW (ref 21–57)
TIBC: 399 ug/dL (ref 236–444)
UIBC: 324 ug/dL (ref 120–384)

## 2020-12-21 LAB — FERRITIN: Ferritin: 183 ng/mL (ref 11–307)

## 2020-12-22 ENCOUNTER — Encounter: Payer: Self-pay | Admitting: Hematology and Oncology

## 2020-12-22 ENCOUNTER — Telehealth: Payer: Self-pay | Admitting: Hematology and Oncology

## 2020-12-22 NOTE — Telephone Encounter (Signed)
Scheduled follow-up appointment per 3/7 schedule message. Patient is aware.

## 2020-12-23 ENCOUNTER — Telehealth: Payer: Self-pay | Admitting: *Deleted

## 2020-12-23 NOTE — Telephone Encounter (Signed)
[name] patient regarding her recent lab results.  Spoke with patient and advised that her iron results are very good , with a Ferretin of 178. The goal had been >100.  Advised that she has an appt in 3 months to re-check her labs and see Dr. Lorenso Courier. Xan voiced understanding

## 2020-12-23 NOTE — Telephone Encounter (Signed)
-----   Message from Orson Slick, MD sent at 12/21/2020 10:40 AM EST ----- Please let [name] know that her iron labs are good and robust, with a ferritin of 178 (goal >100). We will see her back in 3 months time to assure her iron levels stay elevated.   ----- Message ----- From: Buel Ream, Lab In [place] Sent: 12/18/2020   2:33 PM EST To: Orson Slick, MD

## 2021-01-08 ENCOUNTER — Other Ambulatory Visit: Payer: Self-pay | Admitting: Family Medicine

## 2021-02-17 ENCOUNTER — Other Ambulatory Visit (HOSPITAL_COMMUNITY): Payer: Self-pay | Admitting: Physician Assistant

## 2021-02-17 DIAGNOSIS — G8929 Other chronic pain: Secondary | ICD-10-CM

## 2021-02-17 DIAGNOSIS — M545 Low back pain, unspecified: Secondary | ICD-10-CM

## 2021-02-18 ENCOUNTER — Other Ambulatory Visit: Payer: Self-pay

## 2021-02-18 ENCOUNTER — Ambulatory Visit (HOSPITAL_COMMUNITY)
Admission: RE | Admit: 2021-02-18 | Discharge: 2021-02-18 | Disposition: A | Discharge status: Home / Self Care | Payer: 59 | Source: Ambulatory Visit | Attending: Physician Assistant | Admitting: Physician Assistant

## 2021-02-18 DIAGNOSIS — M545 Low back pain, unspecified: Secondary | ICD-10-CM | POA: Insufficient documentation

## 2021-02-18 DIAGNOSIS — G8929 Other chronic pain: Secondary | ICD-10-CM | POA: Diagnosis present

## 2021-03-25 ENCOUNTER — Inpatient Hospital Stay: Payer: 59

## 2021-03-25 ENCOUNTER — Other Ambulatory Visit: Payer: Self-pay | Admitting: Hematology and Oncology

## 2021-03-25 ENCOUNTER — Inpatient Hospital Stay: Payer: 59 | Attending: Hematology and Oncology | Admitting: Hematology and Oncology

## 2021-03-25 ENCOUNTER — Other Ambulatory Visit: Payer: Self-pay

## 2021-03-25 ENCOUNTER — Encounter: Payer: Self-pay | Admitting: Hematology and Oncology

## 2021-03-25 VITALS — BP 114/67 | HR 53 | Temp 97.7°F | Resp 17 | Ht 64.0 in | Wt 203.4 lb

## 2021-03-25 DIAGNOSIS — D508 Other iron deficiency anemias: Secondary | ICD-10-CM

## 2021-03-25 DIAGNOSIS — R5383 Other fatigue: Secondary | ICD-10-CM | POA: Insufficient documentation

## 2021-03-25 DIAGNOSIS — Z79899 Other long term (current) drug therapy: Secondary | ICD-10-CM | POA: Diagnosis not present

## 2021-03-25 DIAGNOSIS — K219 Gastro-esophageal reflux disease without esophagitis: Secondary | ICD-10-CM | POA: Diagnosis not present

## 2021-03-25 DIAGNOSIS — Z8249 Family history of ischemic heart disease and other diseases of the circulatory system: Secondary | ICD-10-CM | POA: Diagnosis not present

## 2021-03-25 DIAGNOSIS — Z836 Family history of other diseases of the respiratory system: Secondary | ICD-10-CM | POA: Insufficient documentation

## 2021-03-25 DIAGNOSIS — Z8719 Personal history of other diseases of the digestive system: Secondary | ICD-10-CM | POA: Insufficient documentation

## 2021-03-25 DIAGNOSIS — Z9884 Bariatric surgery status: Secondary | ICD-10-CM

## 2021-03-25 DIAGNOSIS — E039 Hypothyroidism, unspecified: Secondary | ICD-10-CM | POA: Diagnosis not present

## 2021-03-25 DIAGNOSIS — Z880 Allergy status to penicillin: Secondary | ICD-10-CM | POA: Diagnosis not present

## 2021-03-25 LAB — CBC WITH DIFFERENTIAL (CANCER CENTER ONLY)
Abs Immature Granulocytes: 0.01 10*3/uL (ref 0.00–0.07)
Basophils Absolute: 0.1 10*3/uL (ref 0.0–0.1)
Basophils Relative: 1 %
Eosinophils Absolute: 0.2 10*3/uL (ref 0.0–0.5)
Eosinophils Relative: 3 %
HCT: 42.7 % (ref 36.0–46.0)
Hemoglobin: 14 g/dL (ref 12.0–15.0)
Immature Granulocytes: 0 %
Lymphocytes Relative: 40 %
Lymphs Abs: 2 10*3/uL (ref 0.7–4.0)
MCH: 31.7 pg (ref 26.0–34.0)
MCHC: 32.8 g/dL (ref 30.0–36.0)
MCV: 96.6 fL (ref 80.0–100.0)
Monocytes Absolute: 0.6 10*3/uL (ref 0.1–1.0)
Monocytes Relative: 12 %
Neutro Abs: 2.2 10*3/uL (ref 1.7–7.7)
Neutrophils Relative %: 44 %
Platelet Count: 206 10*3/uL (ref 150–400)
RBC: 4.42 MIL/uL (ref 3.87–5.11)
RDW: 13.2 % (ref 11.5–15.5)
WBC Count: 5.1 10*3/uL (ref 4.0–10.5)
nRBC: 0 % (ref 0.0–0.2)

## 2021-03-25 LAB — IRON AND TIBC
Iron: 89 ug/dL (ref 41–142)
Saturation Ratios: 22 % (ref 21–57)
TIBC: 401 ug/dL (ref 236–444)
UIBC: 313 ug/dL (ref 120–384)

## 2021-03-25 LAB — FERRITIN: Ferritin: 61 ng/mL (ref 11–307)

## 2021-03-25 LAB — CMP (CANCER CENTER ONLY)
ALT: 43 U/L (ref 0–44)
AST: 45 U/L — ABNORMAL HIGH (ref 15–41)
Albumin: 4 g/dL (ref 3.5–5.0)
Alkaline Phosphatase: 102 U/L (ref 38–126)
Anion gap: 10 (ref 5–15)
BUN: 13 mg/dL (ref 6–20)
CO2: 26 mmol/L (ref 22–32)
Calcium: 9.5 mg/dL (ref 8.9–10.3)
Chloride: 103 mmol/L (ref 98–111)
Creatinine: 0.78 mg/dL (ref 0.44–1.00)
GFR, Estimated: >60 mL/min (ref >60)
Glucose, Bld: 94 mg/dL (ref 70–99)
Potassium: 4.1 mmol/L (ref 3.5–5.1)
Sodium: 139 mmol/L (ref 135–145)
Total Bilirubin: 0.4 mg/dL (ref 0.3–1.2)
Total Protein: 7.6 g/dL (ref 6.5–8.1)

## 2021-03-25 LAB — RETIC PANEL
Immature Retic Fract: 5.6 % (ref 2.3–15.9)
RBC.: 4.36 MIL/uL (ref 3.87–5.11)
Retic Count, Absolute: 27.5 10*3/uL (ref 19.0–186.0)
Retic Ct Pct: 0.6 % (ref 0.4–3.1)
Reticulocyte Hemoglobin: 35.3 pg (ref >27.9)

## 2021-03-25 NOTE — Progress Notes (Signed)
[place] Telephone:(336) [phone]   Fax:([phone]  PROGRESS NOTE  Patient Care Team: Kathyrn Drown, MD as PCP - General (Family Medicine) Danie Binder, MD (Inactive) (Gastroenterology) Lahoma Rocker, MD as Consulting Physician (Rheumatology)  Hematological/Oncological History # Iron Deficiency Anemia 2/2 to Gastric Bipass 10/07/2020: WBC 5.2, Hgb 10.7, MCV 81, Plt 293. Iron 30, TIBC 447, Ferritin 9, Iron sat 7%.  10/30/2020: establish care with Dr. Lorenso Courier  11/13/2020-12/11/2020: 5 doses of IV iron sucrose 200mg  weekly 12/18/2020: WBC 4.5, Hgb 12.2, MCV 83.2, Plt 244 03/25/2021: WBC 5.1, Hgb 14.0, MCV 96.6, Plt 206  Interval History:  [name] 60 y.o. female with medical history significant for iron deficiency anemia 2/2 to Gastric bipass who presents for a follow up visit. The patient's last visit was on 12/18/2020 .Marland Kitchen In the interim since the last visit she has had no major changes in health.  On exam today [name] reports that she has been quite well in the interim since her last visit.  She reports that her ice cravings have completely gone away and that her energy levels are markedly improved.  She is very satisfied with the boost in energy that she has received.  She has no complaints today and has had no recent changes in her health.  She otherwise denies any fevers, chills, sweats, nausea, vomiting or diarrhea. She has had no overt signs of bleeding, bruising, or dark stools. She has not made an attempt to increase her diet and include any more iron rich foods. A full 10 point ROS is listed below.  MEDICAL HISTORY:  Past Medical History:  Diagnosis Date   Abdominal pain of unknown etiology    Anal fistula    hx of    Aortic atherosclerosis ([facility])    Constipation    severe, history of   DDD (degenerative disc disease), lumbar    Depression    Diarrhea    history of   GERD (gastroesophageal reflux disease)    History of hiatal hernia     Hypothyroidism    Iron deficiency anemia    Left shoulder pain    Low back pain    Lumbar herniated disc    Neck pain    Numbness of right lower extremity    Olecranon bursitis    Plantar fasciitis, left    Prediabetes    RA (rheumatoid arthritis) ([facility])    Right carpal tunnel syndrome 01/09/2018   S/P colonoscopy April 2011   Dr. Olevia Perches: mild diverticulosis, hyperplastic polyps, repeat in 10 years   S/P endoscopy April 2011   Dr. Olevia Perches: no hiatal hernia, generous opening to antrum., patent gastrojejunostomy   Sciatic leg pain    Right   Ulnar neuropathy at elbow, right 01/09/2018   Vitamin D deficiency    Weakness of right lower extremity     SURGICAL HISTORY: Past Surgical History:  Procedure Laterality Date   anal fistulostomy     w/marsupialization an excision of anal tags   BACK SURGERY  10/2017   BREAST LUMPECTOMY Right    benign   CARPAL TUNNEL WITH CUBITAL TUNNEL Right 02/06/2018   Procedure: CARPAL TUNNEL RELEASE RIGHT TENOSYNOVECTOMY;  Surgeon: Daryll Brod, MD;  Location: [place];  Service: Orthopedics;  Laterality: Right;  regional block   COLONOSCOPY     ENDOMETRIAL ABLATION     ESOPHAGOGASTRODUODENOSCOPY     GASTRIC BYPASS     mini gastric bypass in [place] reportedly  ROTATOR CUFF REPAIR  07/2017   ULNAR NERVE TRANSPOSITION Right 02/06/2018   Procedure: ULNAR NERVE DECOMPRESSION;  Surgeon: Daryll Brod, MD;  Location: [place];  Service: Orthopedics;  Laterality: Right;  regional block    ULNAR TUNNEL RELEASE Right 02/06/2018   Procedure: CUBITAL TUNNEL RELEASE;  Surgeon: Daryll Brod, MD;  Location: [place];  Service: Orthopedics;  Laterality: Right;  regoinal block    SOCIAL HISTORY: Social History   Socioeconomic History   Marital status: Married    Spouse name: Not on file   Number of children: Not on file   Years of education: Not on file   Highest education level: Not on file  Occupational  History   Occupation: Technician    Comment: [employer]   Tobacco Use   Smoking status: Former    Pack years: 0.00   Smokeless tobacco: Never   Tobacco comments:    quit 2006  Vaping Use   Vaping Use: Never used  Substance and Sexual Activity   Alcohol use: Yes    Alcohol/week: 1.0 standard drink    Types: 1 Standard drinks or equivalent per week    Comment: social drinker   Drug use: No   Sexual activity: Yes    Partners: Male    Birth control/protection: None    Comment: spouse  Other Topics Concern   Not on file  Social History Narrative   Not on file   Social Determinants of Health   Financial Resource Strain: Not on file  Food Insecurity: Not on file  Transportation Needs: Not on file  Physical Activity: Not on file  Stress: Not on file  Social Connections: Not on file  Intimate Partner Violence: Not on file    FAMILY HISTORY: Family History  Problem Relation Age of Onset   COPD Mother        living   Heart disease Father        deceased   Colon cancer Neg Hx     ALLERGIES:  is allergic to penicillins.  MEDICATIONS:  Current Outpatient Medications  Medication Sig Dispense Refill   Lactobacillus (PROBIOTIC ACIDOPHILUS PO) Take by mouth.     Calcium Carbonate-Vitamin D (CALCIUM 600/VITAMIN D PO) Take 1 tablet by mouth daily.     Cholecalciferol (VITAMIN D3) 50 MCG (2000 UT) capsule Take 2,000 Units by mouth daily.     DHEA 50 MG CAPS Take 50 mg by mouth daily.     folic acid (FOLVITE) 1 MG tablet Take 1 mg by mouth daily.  0   HUMIRA PEN 40 MG/0.4ML PNKT SMARTSIG:40 Milligram(s) SUB-Q Every 2 Weeks     HYDROcodone-acetaminophen (NORCO/VICODIN) 5-325 MG tablet Take 1 tablet by mouth every 6 (six) hours as needed. (Patient not taking: Reported on 12/18/2020) 30 tablet 0   levothyroxine (SYNTHROID) 88 MCG tablet TAKE 1 TABLET(88 MCG) BY MOUTH DAILY 90 tablet 1   methotrexate (RHEUMATREX) 2.5 MG tablet Take 3 tablets (7.5 mg total) by mouth See admin  instructions. Take 7.5 mg by mouth on Monday and Tuesday 4 tablet 0   vitamin B-12 (CYANOCOBALAMIN) 1000 MCG tablet Take 1,000 mcg by mouth daily.     No current facility-administered medications for this visit.    REVIEW OF SYSTEMS:   Constitutional: ( - ) fevers, ( - )  chills , ( - ) night sweats Eyes: ( - ) blurriness of vision, ( - ) double vision, ( - ) watery eyes Ears, nose,  mouth, throat, and face: ( - ) mucositis, ( - ) sore throat Respiratory: ( - ) cough, ( - ) dyspnea, ( - ) wheezes Cardiovascular: ( - ) palpitation, ( - ) chest discomfort, ( - ) lower extremity swelling Gastrointestinal:  ( - ) nausea, ( - ) heartburn, ( - ) change in bowel habits Skin: ( - ) abnormal skin rashes Lymphatics: ( - ) new lymphadenopathy, ( - ) easy bruising Neurological: ( - ) numbness, ( - ) tingling, ( - ) new weaknesses Behavioral/Psych: ( - ) mood change, ( - ) new changes  All other systems were reviewed with the patient and are negative.  PHYSICAL EXAMINATION:  Vitals:   03/25/21 1218  BP: 114/67  Pulse: (!) 53  Resp: 17  Temp: 97.7 F (36.5 C)  SpO2: 100%    Filed Weights   03/25/21 1218  Weight: 203 lb 6.4 oz (92.3 kg)     GENERAL: well appearing middle aged Caucasian female alert, no distress and comfortable SKIN: skin color, texture, turgor are normal, no rashes or significant lesions EYES: conjunctiva are pink and non-injected, sclera clear LUNGS: clear to auscultation and percussion with normal breathing effort HEART: regular rate & rhythm and no murmurs and no lower extremity edema Musculoskeletal: no cyanosis of digits and no clubbing  PSYCH: alert & oriented x 3, fluent speech NEURO: no focal motor/sensory deficits  LABORATORY DATA:  I have reviewed the data as listed CBC Latest Ref Rng & Units 03/25/2021 12/18/2020 10/30/2020  WBC 4.0 - 10.5 K/uL 5.1 4.5 6.6  Hemoglobin 12.0 - 15.0 g/dL 14.0 12.2 10.9(L)  Hematocrit 36.0 - 46.0 % 42.7 37.1 34.1(L)  Platelets  150 - 400 K/uL 206 244 313    CMP Latest Ref Rng & Units 03/25/2021 12/18/2020 10/30/2020  Glucose 70 - 99 mg/dL 94 93 83  BUN 6 - 20 mg/dL 13 19 19   Creatinine 0.44 - 1.00 mg/dL 0.78 0.78 0.83  Sodium 135 - 145 mmol/L 139 132(L) 136  Potassium 3.5 - 5.1 mmol/L 4.1 3.9 4.2  Chloride 98 - 111 mmol/L 103 97(L) 101  CO2 22 - 32 mmol/L 26 25 26   Calcium 8.9 - 10.3 mg/dL 9.5 9.1 9.4  Total Protein 6.5 - 8.1 g/dL 7.6 7.3 7.5  Total Bilirubin 0.3 - 1.2 mg/dL 0.4 0.4 0.3  Alkaline Phos 38 - 126 U/L 102 113 124  AST 15 - 41 U/L 45(H) 34 26  ALT 0 - 44 U/L 43 39 26    RADIOGRAPHIC STUDIES: No results found.  ASSESSMENT & PLAN [name] 60 y.o. female with medical history significant for iron deficiency anemia 2/2 to Gastric bipass who presents for a follow up visit.   After review the labs, the records, schedule the patient the findings most consistent with iron deficiency anemia secondary to a gastric bypass. She received the IV sucrose treatment x5 doses and had a increase in her hemoglobin up normal levels. Additionally her ferritin is above goal of 100 and her TIBC is returned to normal. Overall she is had an excellent response to the IV iron therapy. Given that the likely cause is poor absorption due to her gastric bypass we recommended she follow-up in 3 months time in order to assure that her iron stores are still replete. We will need at least periodic follow-up with her every 6 to 12 months in order to assure her iron levels are at goal.  # Iron Deficiency Anemia 2/2 to Gastric Bipass --  patient completed 5 doses of IV iron sucrose 200mg  weekly from 11/13/2020-12/11/2020 --Hgb has improved and most symptoms have resolved. Patient does have a persistent lack of energy --due to gastric bipass may require future IV iron treaments --recommend RTC in 6 months time to assure her iron stores remain replete.   No orders of the defined types were placed in this encounter.   All questions  were answered. The patient knows to call the clinic with any problems, questions or concerns.  A total of more than 30 minutes were spent on this encounter and over half of that time was spent on counseling and coordination of care as outlined above.   Ledell Peoples, MD Department of Hematology/Oncology [place] at [facility] Phone: [phone] Pager: [phone] Email: [email or web address]  03/25/2021 4:43 PM

## 2021-03-26 ENCOUNTER — Telehealth: Payer: Self-pay | Admitting: Hematology and Oncology

## 2021-03-26 NOTE — Telephone Encounter (Signed)
Scheduled per [place]. Called and spoke with patient. Confirmed appt 

## 2021-07-28 ENCOUNTER — Telehealth: Payer: Self-pay | Admitting: Family Medicine

## 2021-07-28 NOTE — Telephone Encounter (Signed)
Health Examination Certificate needs your review and signature. Placed in your box.  CB#:  [phone]

## 2021-08-01 NOTE — Telephone Encounter (Signed)
Nurses-please touch base with patient regarding her health examination form.  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  Please make sure she is not having any limitations with vision, hearing, heart, lungs, lifting carrying.  Also make sure she is not having any signs of current sickness or communicable disease such as fevers night sweats weight loss.  If she states not having any problems with the above this form can be filled out and forwarded back to her.  Thanks-Dr. Luanna Salk see form

## 2021-08-02 NOTE — Telephone Encounter (Signed)
Pt states she is not having any limitations, no signs of current sickness or communicable diseases. Form filled out and placed on provider door for signature. Please advise. Thank you

## 2021-08-03 NOTE — Telephone Encounter (Signed)
Form is ready.

## 2021-08-03 NOTE — Telephone Encounter (Signed)
Pt came by this morning to pick up form

## 2021-09-06 LAB — HM MAMMOGRAPHY: HM Mammogram: NORMAL (ref 0–4)

## 2021-09-08 ENCOUNTER — Encounter: Payer: Self-pay | Admitting: Family Medicine

## 2021-09-30 ENCOUNTER — Inpatient Hospital Stay: Payer: 59

## 2021-09-30 ENCOUNTER — Other Ambulatory Visit: Payer: Self-pay | Admitting: Hematology and Oncology

## 2021-09-30 ENCOUNTER — Inpatient Hospital Stay: Payer: 59 | Attending: Hematology and Oncology | Admitting: Hematology and Oncology

## 2021-09-30 DIAGNOSIS — Z9884 Bariatric surgery status: Secondary | ICD-10-CM

## 2021-09-30 DIAGNOSIS — D500 Iron deficiency anemia secondary to blood loss (chronic): Secondary | ICD-10-CM

## 2021-12-24 ENCOUNTER — Telehealth: Payer: Self-pay | Admitting: Family Medicine

## 2021-12-24 ENCOUNTER — Other Ambulatory Visit: Payer: Self-pay | Admitting: *Deleted

## 2021-12-24 NOTE — Telephone Encounter (Signed)
levothyroxine (SYNTHROID) 88 MCG tablet [[id number]]  ?  Order Details ?Dose, Route, Frequency: As Directed  ?Dispense Quantity: 90 tablet Refills: 1   ?     ?Sig: TAKE 1 TABLET(88 MCG) BY MOUTH DAILY  ? ?

## 2021-12-24 NOTE — Telephone Encounter (Signed)
Refused rx. Patient not been seen since 04/29/21. Needs appointment ?

## 2022-02-02 ENCOUNTER — Encounter: Payer: 59 | Attending: Family Medicine | Admitting: Nutrition

## 2022-02-02 VITALS — Ht 64.0 in | Wt 218.0 lb

## 2022-02-02 DIAGNOSIS — Z9884 Bariatric surgery status: Secondary | ICD-10-CM | POA: Insufficient documentation

## 2022-02-02 NOTE — Progress Notes (Signed)
Medical Nutrition Therapy  ?Appointment Start time:  6754  Appointment End time:  4920 ? ?Primary concerns today: E66.9  ?Referral diagnosis: E66.9 ?Preferred learning style: No preference  ?Learning readiness: ready  ? ?NUTRITION ASSESSMENT  ?Obese 61 yr old female with a history of gastric bypass 15 yrs ago.Marland Kitchen Hasn't been followed by bariatric team in a long time. Isn't taking her vitamins as directed. Isn't following a high protein diet post gastric bypass. ? ?Has gone through [facility] weigh loss. Wants to get back to eating healthier and get her weight back off. ? ? ?Anthropometrics  ?Wt Readings from Last 3 Encounters:  ?02/02/22 218 lb (98.9 kg)  ?03/25/21 203 lb 6.4 oz (92.3 kg)  ?12/18/20 196 lb 11.2 oz (89.2 kg)  ? ?Ht Readings from Last 3 Encounters:  ?02/02/22 '5\' 4"'$  (1.626 m)  ?03/25/21 '5\' 4"'$  (1.626 m)  ?12/18/20 '5\' 4"'$  (1.626 m)  ? ?Body mass index is 37.42 kg/m?. ?'@BMIFA'$ @ ?Facility age limit for growth percentiles is 20 years. ?Facility age limit for growth percentiles is 20 years. ?  ? ?Clinical ?Medical Hx: Gastic bypass, thyroid, RA. ?Medications: see chart ?Labs: No results found for: HGBA1C ? ?  Latest Ref Rng & Units 03/25/2021  ? 11:50 AM 12/18/2020  ?  2:22 PM 10/30/2020  ?  3:03 PM  ?CMP  ?Glucose 70 - 99 mg/dL 94   93   83    ?BUN 6 - 20 mg/dL '13   19   19    '$ ?Creatinine 0.44 - 1.00 mg/dL 0.78   0.78   0.83    ?Sodium 135 - 145 mmol/L 139   132   136    ?Potassium 3.5 - 5.1 mmol/L 4.1   3.9   4.2    ?Chloride 98 - 111 mmol/L 103   97   101    ?CO2 22 - 32 mmol/L '26   25   26    '$ ?Calcium 8.9 - 10.3 mg/dL 9.5   9.1   9.4    ?Total Protein 6.5 - 8.1 g/dL 7.6   7.3   7.5    ?Total Bilirubin 0.3 - 1.2 mg/dL 0.4   0.4   0.3    ?Alkaline Phos 38 - 126 U/L 102   113   124    ?AST 15 - 41 U/L 45   34   26    ?ALT 0 - 44 U/L 43   39   26    ? ?Lipid Panel  ?   ?Component Value Date/Time  ? CHOL 186 05/05/2020 0843  ? TRIG 46 05/05/2020 0843  ? HDL 90 05/05/2020 0843  ? CHOLHDL 2.1 05/05/2020 0843  ? [place] 87  05/05/2020 0843  ? LABVLDL 9 05/05/2020 0843  ? ? ?Notable Signs/Symptoms: Fatigue, no energy,  ? ?Lifestyle & Dietary Hx ?Lives with her husband. Both cook, she shops. ?Gastric bypass 15 yrs ago. Had been going to [facility] weigh tloss. ? ?Estimated daily fluid intake: 20 oz ?Supplements: Calcium, Vit B 23, SHEAS, Vit D and folic acid,  ?Sleep: varies ?Stress / self-care:  ?Current average weekly physical activity: ADL ? ?24-Hr Dietary Recall ?Eats 1-2 meals per day. Not eating on schedule.  ? ?Estimated Energy Needs ?Calories: 1200 ?Carbohydrate: 135g ?Protein: 90g ?Fat: 33g ? ? ?NUTRITION DIAGNOSIS  ?NB-1.1 Food and nutrition-related knowledge deficit As related to Obesity.  As evidenced by BMI 37. ? ? ?NUTRITION INTERVENTION  ?Nutrition education (E-1) on the following topics:  ?  counseling and diabetes education initiated. Discussed Carb Counting as method of portion control, reading food labels, and benefits of increased activity.  ?  ?  ?NUTRITION INTERVENTION ?Nutrition counseling (C-1) and education (E-2) to facilitate bariatric surgery goals, including: ? ?The importance of consuming adequate calories as well as certain nutrients daily due to the body's need for essential vitamins, minerals, and fats ?The importance of daily physical activity and to reach a goal of at least 150 minutes of moderate to vigorous physical activity weekly (or as directed by their physician) due to benefits such as increased musculature and improved lab values ?The importance of intuitive eating specifically learning hunger-satiety cues and understanding the importance of learning a new body: The importance of mindful eating to avoid grazing behaviors  ? ?Handouts Provided Include  ?Bariatric myplate ?Vitamin regimen ?Plant based meals.  ? ?Learning Style & Readiness for Change ?Teaching method utilized: Visual & Auditory  ?Demonstrated degree of understanding via: Teach Back  ?Readiness Level: Ready ?Barriers to learning/adherence  to lifestyle change: none ? ?MONITORING & EVALUATION ?Dietary intake, weekly physical activity, body weight, and food journal in 1 month. ? ?Goals Established by Pt ?Goals ? ?Follow Bariatric Meal Plan ?Get back on bariatric vitamins and take as prescribed. ?Work on intuitive eating ?Avoid snacks between meals ?Drink only water ?Get in 150 minutes exercise per week ?Chew foods 20-30 times per bite. ?Limit liquids 30 minutes before meal or 15 minutes after. ?Work on [employer] and choose other activities when bored. ?Lose 1 lb per week ?Keep food journal ? ? ?MONITORING & EVALUATION ?Dietary intake, weekly physical activity, and weight in 1 month. ? ?Next Steps  ?Patient is to work on meal planning, intuitive eating and exercise.Marland Kitchen ? ?

## 2022-02-14 ENCOUNTER — Encounter: Payer: Self-pay | Admitting: Nutrition

## 2022-02-14 NOTE — Patient Instructions (Addendum)
Goals ? ?Follow Bariatric Meal Plan ?Get back on bariatric vitamins and take as prescribed. ?Work on intuitive eating ?Avoid snacks between meals ?Drink only water ?Get in 150 minutes exercise per week ?Chew foods 20-30 times per bite. ?Limit liquids 30 minutes before meal or 15 minutes after. ?Work on [employer] and choose other activities when bored. ?Lose 1 lb per week ?Keep food journal ?

## 2022-03-01 ENCOUNTER — Ambulatory Visit: Payer: 59 | Admitting: Nurse Practitioner

## 2022-03-01 ENCOUNTER — Encounter: Payer: Self-pay | Admitting: Nurse Practitioner

## 2022-03-01 ENCOUNTER — Ambulatory Visit (HOSPITAL_COMMUNITY)
Admission: RE | Admit: 2022-03-01 | Discharge: 2022-03-01 | Disposition: A | Discharge status: Home / Self Care | Payer: 59 | Source: Ambulatory Visit | Attending: Nurse Practitioner | Admitting: Nurse Practitioner

## 2022-03-01 VITALS — BP 112/64 | HR 67 | Temp 97.9°F | Wt 216.0 lb

## 2022-03-01 DIAGNOSIS — M25561 Pain in right knee: Secondary | ICD-10-CM

## 2022-03-01 NOTE — Progress Notes (Addendum)
? ?  Subjective:  ? ? Patient ID: [name], female    DOB: September 17, 1961, 61 y.o.   MRN: [id number] ? ?HPI ? ?61 year old female with history of rheumatoid arthritis presents to the clinic today with complaints of right knee pain for several months.  Patient states that the knee pain is localized to the lateral aspect of her knee.  Patient states that pain is worse with bending.  She does yoga, line dancing and walking.  Feels swollen at times, has applied ice/heat and taking Motrin which helps.  Patient was on the elliptical about a month ago and next day knee was really bothering her.  Patient denies injury ? ?Review of Systems  ?Musculoskeletal:   ?     Right knee pain  ?All other systems reviewed and are negative. ? ?   ?Objective:  ? Physical Exam ?Vitals reviewed.  ?Constitutional:   ?   General: She is not in acute distress. ?   Appearance: Normal appearance. She is obese. She is not ill-appearing, toxic-appearing or diaphoretic.  ?HENT:  ?   Head: Normocephalic and atraumatic.  ?Musculoskeletal:     ?   General: Swelling and tenderness present.  ?   Right knee: Swelling and effusion present. No deformity, erythema, ecchymosis, lacerations, bony tenderness or crepitus. Normal range of motion. Tenderness present over the lateral joint line and LCL. No medial joint line, MCL, ACL, PCL or patellar tendon tenderness. No LCL laxity, MCL laxity, ACL laxity or PCL laxity. Normal alignment, normal meniscus and normal patellar mobility. Normal pulse.  ?   Left knee: Normal.  ?   Comments: Mild swelling to lateral area of right knee. ? ?Strength 5 out of 5 to bilateral lower extremities  ?Neurological:  ?   Mental Status: She is alert.  ?   Coordination: Coordination normal.  ?   Gait: Gait normal.  ?   Comments: Grossly intact  ?Psychiatric:     ?   Mood and Affect: Mood normal.     ?   Behavior: Behavior normal.  ? ?   ?Assessment & Plan:  ? ?1. Acute pain of right knee ?-Suspect lateral meniscus or LCL injury  ?-Do  not suspect sequelae from history of rheumatoid arthritis due to the patient's complaint and clinical findings however will review x-ray for structural changes ?- Patient to take OTC ibuprofen as needed for pain management ?- May use ace wrap or knee brace for comfort and improve stability.  ?- Ambulatory referral to Orthopedic Surgery ?-If referral to orthopedic surgery nonconclusive we will refer patient to rheumatology ?- DG Knee Complete 4 Views Right to r/o possible fracture or structural changes. ?- RTC if symptoms worsen or do not improve. ? ?  ?Note:  This document was prepared using Dragon voice recognition software and may include unintentional dictation errors. ?Note - This record has been created using [employer].  ?Chart creation errors have been sought, but may not always  ?have been located. Such creation errors do not reflect on  ?the standard of medical care. ? ? ?

## 2022-03-07 ENCOUNTER — Encounter: Payer: Self-pay | Admitting: Nutrition

## 2022-03-07 ENCOUNTER — Encounter: Payer: 59 | Attending: Family Medicine | Admitting: Nutrition

## 2022-03-07 VITALS — Ht 64.0 in | Wt 215.0 lb

## 2022-03-07 DIAGNOSIS — Z9884 Bariatric surgery status: Secondary | ICD-10-CM | POA: Insufficient documentation

## 2022-03-07 NOTE — Patient Instructions (Signed)
Goals  Eat three meals per day of plant based foods Cut  out  processed, cheese, crackers Go [employer] website and look into the meal planning and recipes.

## 2022-03-07 NOTE — Progress Notes (Signed)
Medical Nutrition Therapy  Appointment Start time:  46 Appointment End time:  47  Primary concerns today: E66.9  Referral diagnosis: E66.9 Preferred learning style: No preference  Learning readiness: ready   NUTRITION ASSESSMENT  Obese 61 yr old female with a history of gastric bypass 15 yrs ago..   Changes: Hasn't been putting in creamer or surgar in coffee. Drinking more water. Struggling with cutting out red meat and adding more vegetables. Lost 3 lbs since visit. She bought the bariatric MVI, calcium. Still struggles with eating balanced meals, eating breakfast and eating on time. She doesn't like a lot of vegetables. Trying to eat more protein.  Has kept a food journal and feels it was helpful for her to be more aware of eating habits.  Needs more application of whole foods,plant based foods.  Anthropometrics  Wt Readings from Last 3 Encounters:  03/01/22 216 lb (98 kg)  02/02/22 218 lb (98.9 kg)  03/25/21 203 lb 6.4 oz (92.3 kg)   Ht Readings from Last 3 Encounters:  02/02/22 '5\' 4"'$  (1.626 m)  03/25/21 '5\' 4"'$  (1.626 m)  12/18/20 '5\' 4"'$  (1.626 m)   There is no height or weight on file to calculate BMI. '@BMIFA'$ @ Facility age limit for growth percentiles is 20 years. Facility age limit for growth percentiles is 20 years.    Clinical Medical Hx: Gastic bypass, thyroid, RA. Medications: see chart Labs: No results found for: HGBA1C    Latest Ref Rng & Units 03/25/2021   11:50 AM 12/18/2020    2:22 PM 10/30/2020    3:03 PM  CMP  Glucose 70 - 99 mg/dL 94   93   83    BUN 6 - 20 mg/dL '13   19   19    '$ Creatinine 0.44 - 1.00 mg/dL 0.78   0.78   0.83    Sodium 135 - 145 mmol/L 139   132   136    Potassium 3.5 - 5.1 mmol/L 4.1   3.9   4.2    Chloride 98 - 111 mmol/L 103   97   101    CO2 22 - 32 mmol/L '26   25   26    '$ Calcium 8.9 - 10.3 mg/dL 9.5   9.1   9.4    Total Protein 6.5 - 8.1 g/dL 7.6   7.3   7.5    Total Bilirubin 0.3 - 1.2 mg/dL 0.4   0.4   0.3    Alkaline Phos  38 - 126 U/L 102   113   124    AST 15 - 41 U/L 45   34   26    ALT 0 - 44 U/L 43   39   26     Lipid Panel     Component Value Date/Time   CHOL 186 05/05/2020 0843   TRIG 46 05/05/2020 0843   HDL 90 05/05/2020 0843   CHOLHDL 2.1 05/05/2020 0843   LDLCALC 87 05/05/2020 0843   LABVLDL 9 05/05/2020 0843    Notable Signs/Symptoms: Fatigue, no energy,   Lifestyle & Dietary Hx Lives with her husband. Both cook, she shops. Gastric bypass 15 yrs ago. Had been going to [facility] weigh tloss.  Estimated daily fluid intake: 20 oz Supplements: Calcium, Vit B 23, SHEAS, Vit D and folic acid,  Sleep: varies Stress / self-care:  Current average weekly physical activity: ADL  24-Hr Dietary Recall B) Protein shake L) cheese and crackers, water Dinner: chicken, green beans,  water  Estimated Energy Needs Calories: 1200 Carbohydrate: 135g Protein: 90g Fat: 33g   NUTRITION DIAGNOSIS  NB-1.1 Food and nutrition-related knowledge deficit As related to Obesity.  As evidenced by BMI 37.   NUTRITION INTERVENTION  Nutrition education (E-1) on the following topics:  counseling and diabetes education initiated. Discussed Carb Counting as method of portion control, reading food labels, and benefits of increased activity.      NUTRITION INTERVENTION Nutrition counseling (C-1) and education (E-2) to facilitate bariatric surgery goals, including:  The importance of consuming adequate calories as well as certain nutrients daily due to the body's need for essential vitamins, minerals, and fats The importance of daily physical activity and to reach a goal of at least 150 minutes of moderate to vigorous physical activity weekly (or as directed by their physician) due to benefits such as increased musculature and improved lab values The importance of intuitive eating specifically learning hunger-satiety cues and understanding the importance of learning a new body: The importance of mindful eating to  avoid grazing behaviors   Handouts Provided Include  Bariatric myplate Vitamin regimen Plant based meals.   Learning Style & Readiness for Change Teaching method utilized: Visual & Auditory  Demonstrated degree of understanding via: Teach Back  Readiness Level: Ready Barriers to learning/adherence to lifestyle change: none  MONITORING & EVALUATION Dietary intake, weekly physical activity, body weight, and food journal in 1 month.  Goals Established by Pt Goals Goals  Eat three meals per day of plant based foods Cut  out  processed, cheese, crackers Go [employer] website and look into the meal planning and recipes.  MONITORING & EVALUATION Dietary intake, weekly physical activity, and weight in 1 month.  Next Steps  Patient is to work on meal planning, intuitive eating and exercise.Marland Kitchen

## 2022-03-09 ENCOUNTER — Ambulatory Visit: Payer: 59 | Admitting: Nutrition

## 2022-03-09 ENCOUNTER — Ambulatory Visit: Payer: 59 | Admitting: Orthopedic Surgery

## 2022-03-09 ENCOUNTER — Encounter: Payer: Self-pay | Admitting: Orthopedic Surgery

## 2022-03-09 VITALS — Ht 64.0 in | Wt 215.0 lb

## 2022-03-09 DIAGNOSIS — M25561 Pain in right knee: Secondary | ICD-10-CM

## 2022-03-09 NOTE — Patient Instructions (Signed)
Motrin 3 times a day for the next 7-10 days and then as needed   Knee Exercises  Ask your health care provider which exercises are safe for you. Do exercises exactly as told by your health care provider and adjust them as directed. It is normal to feel mild stretching, pulling, tightness, or discomfort as you do these exercises. Stop right away if you feel sudden pain or your pain gets worse. Do not begin these exercises until told by your health care provider.  Stretching and range-of-motion exercises These exercises warm up your muscles and joints and improve the movement and flexibility of your knee. These exercises also help to relieve pain and swelling.  Knee extension, prone Lie on your abdomen (prone position) on a bed. Place your left / right knee just beyond the edge of the surface so your knee is not on the bed. You can put a towel under your left / right thigh just above your kneecap for comfort. Relax your leg muscles and allow gravity to straighten your knee (extension). You should feel a stretch behind your left / right knee. Hold this position for 10 seconds. Scoot up so your knee is supported between repetitions. Repeat 10 times. Complete this exercise 3-4 times per week.     Knee flexion, active Lie on your back with both legs straight. If this causes back discomfort, bend your left / right knee so your foot is flat on the floor. Slowly slide your left / right heel back toward your buttocks. Stop when you feel a gentle stretch in the front of your knee or thigh (flexion). Hold this position for 10 seconds. Slowly slide your left / right heel back to the starting position. Repeat 10 times. Complete this exercise 3-4 times per week.      Quadriceps stretch, prone Lie on your abdomen on a firm surface, such as a bed or padded floor. Bend your left / right knee and hold your ankle. If you cannot reach your ankle or pant leg, loop a belt around your foot and grab the belt  instead. Gently pull your heel toward your buttocks. Your knee should not slide out to the side. You should feel a stretch in the front of your thigh and knee (quadriceps). Hold this position for 10 seconds. Repeat 10 times. Complete this exercise 3-4 times per week.      Hamstring, supine Lie on your back (supine position). Loop a belt or towel over the ball of your left / right foot. The ball of your foot is on the walking surface, right under your toes. Straighten your left / right knee and slowly pull on the belt to raise your leg until you feel a gentle stretch behind your knee (hamstring). Do not let your knee bend while you do this. Keep your other leg flat on the floor. Hold this position for 10 seconds. Repeat 10 times. Complete this exercise 3-4 times per week.   Strengthening exercises These exercises build strength and endurance in your knee. Endurance is the ability to use your muscles for a long time, even after they get tired.  Quadriceps, isometric This exercise stretches the muscles in front of your thigh (quadriceps) without moving your knee joint (isometric). Lie on your back with your left / right leg extended and your other knee bent. Put a rolled towel or small pillow under your knee if told by your health care provider. Slowly tense the muscles in the front of your left /  right thigh. You should see your kneecap slide up toward your hip or see increased dimpling just above the knee. This motion will push the back of the knee toward the floor. For 10 seconds, hold the muscle as tight as you can without increasing your pain. Relax the muscles slowly and completely. Repeat 10 times. Complete this exercise 3-4 times per week. .     Straight leg raises This exercise stretches the muscles in front of your thigh (quadriceps) and the muscles that move your hips (hip flexors). Lie on your back with your left / right leg extended and your other knee bent. Tense the muscles  in the front of your left / right thigh. You should see your kneecap slide up or see increased dimpling just above the knee. Your thigh may even shake a bit. Keep these muscles tight as you raise your leg 4-6 inches (10-15 cm) off the floor. Do not let your knee bend. Hold this position for 10 seconds. Keep these muscles tense as you lower your leg. Relax your muscles slowly and completely after each repetition. Repeat 10 times. Complete this exercise 3-4 times per week.  Hamstring, isometric Lie on your back on a firm surface. Bend your left / right knee about 30 degrees. Dig your left / right heel into the surface as if you are trying to pull it toward your buttocks. Tighten the muscles in the back of your thighs (hamstring) to "dig" as hard as you can without increasing any pain. Hold this position for 10 seconds. Release the tension gradually and allow your muscles to relax completely for __________ seconds after each repetition. Repeat 10 times. Complete this exercise 3-4 times per week.  Hamstring curls If told by your health care provider, do this exercise while wearing ankle weights. Begin with 5 lb weights. Then increase the weight by 1 lb (0.5 kg) increments. You can also use an exercise band Lie on your abdomen with your legs straight. Bend your left / right knee as far as you can without feeling pain. Keep your hips flat against the floor. Hold this position for 10 seconds. Slowly lower your leg to the starting position. Repeat 10 times. Complete this exercise 3-4 times per week.      Squats This exercise strengthens the muscles in front of your thigh and knee (quadriceps). Stand in front of a table, with your feet and knees pointing straight ahead. You may rest your hands on the table for balance but not for support. Slowly bend your knees and lower your hips like you are going to sit in a chair. Keep your weight over your heels, not over your toes. Keep your lower legs  upright so they are parallel with the table legs. Do not let your hips go lower than your knees. Do not bend lower than told by your health care provider. If your knee pain increases, do not bend as low. Hold the squat position for 10 seconds. Slowly push with your legs to return to standing. Do not use your hands to pull yourself to standing. Repeat 10 times. Complete this exercise 3-4 times per week .     Wall slides This exercise strengthens the muscles in front of your thigh and knee (quadriceps). Lean your back against a smooth wall or door, and walk your feet out 18-24 inches (46-61 cm) from it. Place your feet hip-width apart. Slowly slide down the wall or door until your knees bend 90 degrees. Keep your knees  over your heels, not over your toes. Keep your knees in line with your hips. Hold this position for 10 seconds. Repeat 10 times. Complete this exercise 3-4 times per week.      Straight leg raises This exercise strengthens the muscles that rotate the leg at the hip and move it away from your body (hip abductors). Lie on your side with your left / right leg in the top position. Lie so your head, shoulder, knee, and hip line up. You may bend your bottom knee to help you keep your balance. Roll your hips slightly forward so your hips are stacked directly over each other and your left / right knee is facing forward. Leading with your heel, lift your top leg 4-6 inches (10-15 cm). You should feel the muscles in your outer hip lifting. Do not let your foot drift forward. Do not let your knee roll toward the ceiling. Hold this position for 10 seconds. Slowly return your leg to the starting position. Let your muscles relax completely after each repetition. Repeat 10 times. Complete this exercise 3-4 times per week.      Straight leg raises This exercise stretches the muscles that move your hips away from the front of the pelvis (hip extensors). Lie on your abdomen on a firm  surface. You can put a pillow under your hips if that is more comfortable. Tense the muscles in your buttocks and lift your left / right leg about 4-6 inches (10-15 cm). Keep your knee straight as you lift your leg. Hold this position for 10 seconds. Slowly lower your leg to the starting position. Let your leg relax completely after each repetition. Repeat 10 times. Complete this exercise 3-4 times per week.

## 2022-03-10 ENCOUNTER — Encounter: Payer: Self-pay | Admitting: Orthopedic Surgery

## 2022-03-10 NOTE — Progress Notes (Signed)
New Patient Visit  Assessment: [name] is a 61 y.o. female with the following: 1. Acute pain of right knee  Plan: [name] has pain in the right knee.  No specific injury.  Pain is primarily in the posterior aspect of the knee, as well as her lateral knee.  She has tenderness palpation along the lateral joint line, as well as mild tenderness along the medial joint line.  She has not noticed any swelling.  We discussed multiple treatment options.  She will attempt NSAIDs, over-the-counter for the next 7-10 days.  After this, she will continue with medications as needed.  Also provided her with some home exercises.  She will attempt this.  If she has further issues, we can consider an injection or formal physical therapy.  Follow-up: Return if symptoms worsen or fail to improve.  Subjective:  Chief Complaint  Patient presents with   Knee Pain    Rt knee pain     History of Present Illness: [name] is a 61 y.o. female who has been referred by  Mariane Baumgarten, NP for evaluation of right knee pain.  She has had pain in the knee for a couple of months.  No specific injury.  She notes pain and swelling in the posterior knee.  Otherwise minimal swelling.  She is taking Motrin as needed.  This is providing limited sustained relief.  She does have a history of rheumatoid arthritis.  She has not worked with physical therapy.  No prior injections   Review of Systems: No fevers or chills No numbness or tingling No chest pain No shortness of breath No bowel or bladder dysfunction No GI distress No headaches   Medical History:  Past Medical History:  Diagnosis Date   Abdominal pain of unknown etiology    Anal fistula    hx of    Aortic atherosclerosis (HCC)    Constipation    severe, history of   DDD (degenerative disc disease), lumbar    Depression    Diarrhea    history of   GERD (gastroesophageal reflux disease)    History of hiatal hernia     Hypothyroidism    Iron deficiency anemia    Left shoulder pain    Low back pain    Lumbar herniated disc    Neck pain    Numbness of right lower extremity    Olecranon bursitis    Plantar fasciitis, left    Prediabetes    RA (rheumatoid arthritis) (HCC)    Right carpal tunnel syndrome 01/09/2018   S/P colonoscopy April 2011   Dr. Olevia Perches: mild diverticulosis, hyperplastic polyps, repeat in 10 years   S/P endoscopy April 2011   Dr. Olevia Perches: no hiatal hernia, generous opening to antrum., patent gastrojejunostomy   Sciatic leg pain    Right   Ulnar neuropathy at elbow, right 01/09/2018   Vitamin D deficiency    Weakness of right lower extremity     Past Surgical History:  Procedure Laterality Date   anal fistulostomy     w/marsupialization an excision of anal tags   BACK SURGERY  10/2017   BREAST LUMPECTOMY Right    benign   CARPAL TUNNEL WITH CUBITAL TUNNEL Right 02/06/2018   Procedure: CARPAL TUNNEL RELEASE RIGHT TENOSYNOVECTOMY;  Surgeon: Daryll Brod, MD;  Location: [place];  Service: Orthopedics;  Laterality: Right;  regional block   COLONOSCOPY     ENDOMETRIAL ABLATION     ESOPHAGOGASTRODUODENOSCOPY  GASTRIC BYPASS     mini gastric bypass in [place] reportedly   ROTATOR CUFF REPAIR  07/2017   ULNAR NERVE TRANSPOSITION Right 02/06/2018   Procedure: ULNAR NERVE DECOMPRESSION;  Surgeon: Daryll Brod, MD;  Location: [facility];  Service: Orthopedics;  Laterality: Right;  regional block    ULNAR TUNNEL RELEASE Right 02/06/2018   Procedure: CUBITAL TUNNEL RELEASE;  Surgeon: Daryll Brod, MD;  Location: [place];  Service: Orthopedics;  Laterality: Right;  regoinal block    Family History  Problem Relation Age of Onset   COPD Mother        living   Heart disease Father        deceased   Colon cancer Neg Hx    Social History   Tobacco Use   Smoking status: Former   Smokeless tobacco: Never   Tobacco comments:     quit 2006  Vaping Use   Vaping Use: Never used  Substance Use Topics   Alcohol use: Yes    Alcohol/week: 1.0 standard drink    Types: 1 Standard drinks or equivalent per week    Comment: social drinker   Drug use: No    Allergies  Allergen Reactions   Penicillins Itching    No outpatient medications have been marked as taking for the 03/09/22 encounter (Office Visit) with Mordecai Rasmussen, MD.    Objective: Ht '5\' 4"'$  (1.626 m)   Wt 215 lb (97.5 kg)   BMI 36.90 kg/m   Physical Exam:  General: Alert and oriented. and No acute distress. Gait: Right sided antalgic gait.  Right knee without obvious effusion.  Some swelling in the posterior knee.  There is tenderness to palpation within the popliteal fossa.  Tenderness palpation along the lateral joint line.  Mild tenderness palpation along the medial joint line.  Negative Lachman.  No increased laxity to varus or valgus stress.  Toes are warm and well-perfused.  Full range of motion, with some pain at extremes.  IMAGING: I personally reviewed images previously obtained in clinic  X-rays of the right knee were previously obtained in clinic.  Mild loss of joint space within the medial compartment.  Small osteophytes are noted.  Mild degenerative changes overall   New Medications:  No orders of the defined types were placed in this encounter.     Mordecai Rasmussen, MD  03/10/2022 4:07 PM

## 2022-03-29 ENCOUNTER — Ambulatory Visit: Payer: 59 | Admitting: Dermatology

## 2022-03-29 DIAGNOSIS — H0014 Chalazion left upper eyelid: Secondary | ICD-10-CM

## 2022-03-29 DIAGNOSIS — Z1283 Encounter for screening for malignant neoplasm of skin: Secondary | ICD-10-CM | POA: Diagnosis not present

## 2022-04-11 ENCOUNTER — Telehealth: Payer: Self-pay

## 2022-04-11 ENCOUNTER — Other Ambulatory Visit: Payer: Self-pay | Admitting: *Deleted

## 2022-04-11 MED ORDER — LEVOTHYROXINE SODIUM 88 MCG PO TABS: ORAL_TABLET | ORAL | 1 refills | Status: DC

## 2022-04-11 NOTE — Telephone Encounter (Signed)
Refill sent to pharmacy.   

## 2022-04-11 NOTE — Telephone Encounter (Signed)
Encourage patient to contact the pharmacy for refills or they can request refills through [facility]  (Please schedule appointment if patient has not been seen in over a year)    WHAT PHARMACY WOULD THEY LIKE THIS SENT TO:   MEDICATION NAME & DOSE:levothyroxine (SYNTHROID) 88 MCG tablet  [facility] [phone] - [place], [place] - [address] AT [facility] [address]  [address], [place] [address]  NOTES/COMMENTS FROM PATIENT:      Front office please notify patient: It takes 48-72 hours to process rx refill requests Ask patient to call pharmacy to ensure rx is ready before heading there.

## 2022-04-20 ENCOUNTER — Encounter: Payer: Self-pay | Admitting: Dermatology

## 2022-04-20 NOTE — Progress Notes (Signed)
   New Patient   Subjective  [name] is a 61 y.o. female who presents for the following: Skin Problem (Patient had a lesion on left eye brow above eye. But lesion has went away. Patient also wants [place] to check her cheeks. No history of skin cancer. ).  Check lesion left eyelid, check a few other spots. Location:  Duration:  Quality:  Associated Signs/Symptoms: Modifying Factors:  Severity:  Timing: Context:    The following portions of the chart were reviewed this encounter and updated as appropriate:  Tobacco  Allergies  Meds  Problems  Med Hx  Surg Hx  Fam Hx      Objective  Well appearing patient in no apparent distress; mood and affect are within normal limits. Sun-exposed areas and back examined: No atypical pigmented lesions or nonmelanoma skin cancer.  Left Eye Subtle residual millimeter pink swelling along eyelid margin compatible with resolving chalazion.    All sun exposed areas plus back examined.   Assessment & Plan  Chalazion of left upper eyelid Left Eye  Check as needed recurrence  Encounter for screening for malignant neoplasm of skin  Annual skin examination.  Encouraged to self examine twice annually.  Continued ultraviolet protection.

## 2022-04-22 ENCOUNTER — Telehealth: Payer: Self-pay | Admitting: Hematology and Oncology

## 2022-04-22 NOTE — Telephone Encounter (Signed)
.  Called patient to schedule appointment per 7/6 inabsket, patient is aware of date and time.

## 2022-05-04 ENCOUNTER — Ambulatory Visit: Payer: 59 | Admitting: Hematology and Oncology

## 2022-05-04 ENCOUNTER — Other Ambulatory Visit: Payer: 59

## 2022-05-12 ENCOUNTER — Other Ambulatory Visit: Payer: Self-pay | Admitting: Hematology and Oncology

## 2022-05-12 ENCOUNTER — Inpatient Hospital Stay: Payer: 59 | Attending: Hematology and Oncology

## 2022-05-12 ENCOUNTER — Inpatient Hospital Stay: Payer: 59 | Admitting: Hematology and Oncology

## 2022-05-12 DIAGNOSIS — D508 Other iron deficiency anemias: Secondary | ICD-10-CM

## 2022-05-17 ENCOUNTER — Encounter: Payer: 59 | Attending: Family Medicine | Admitting: Nutrition

## 2022-07-04 ENCOUNTER — Ambulatory Visit: Payer: 59 | Admitting: Nurse Practitioner

## 2022-07-04 ENCOUNTER — Ambulatory Visit (HOSPITAL_COMMUNITY)
Admission: RE | Admit: 2022-07-04 | Discharge: 2022-07-04 | Disposition: A | Discharge status: Home / Self Care | Payer: 59 | Source: Ambulatory Visit | Attending: Nurse Practitioner | Admitting: Nurse Practitioner

## 2022-07-04 ENCOUNTER — Encounter: Payer: Self-pay | Admitting: Nurse Practitioner

## 2022-07-04 VITALS — BP 131/82 | Ht 64.0 in | Wt 221.0 lb

## 2022-07-04 DIAGNOSIS — R21 Rash and other nonspecific skin eruption: Secondary | ICD-10-CM | POA: Diagnosis not present

## 2022-07-04 DIAGNOSIS — E038 Other specified hypothyroidism: Secondary | ICD-10-CM | POA: Diagnosis not present

## 2022-07-04 DIAGNOSIS — M79672 Pain in left foot: Secondary | ICD-10-CM | POA: Diagnosis present

## 2022-07-04 DIAGNOSIS — D508 Other iron deficiency anemias: Secondary | ICD-10-CM

## 2022-07-04 DIAGNOSIS — M65341 Trigger finger, right ring finger: Secondary | ICD-10-CM

## 2022-07-04 DIAGNOSIS — Z13220 Encounter for screening for lipoid disorders: Secondary | ICD-10-CM

## 2022-07-04 MED ORDER — TRIAMCINOLONE ACETONIDE 0.1 % EX CREA: 1.0000 | TOPICAL_CREAM | Freq: Two times a day (BID) | CUTANEOUS | 0 refills | Status: DC

## 2022-07-04 NOTE — Progress Notes (Signed)
Subjective:    Patient ID: [name], female    DOB: [birth year], 61 y.o.   MRN: [id number]  HPI Patient arrives with left heel pain for 2-3 weeks. Patient states that pain is worse in the morning and gradually gets better throughout the day. Patient describes pain as a  tightness that only occurs after periods of resting. Patient also states that the back of her heel is swollen.   Patient also has a concern about her right ring finger catching. Patient states that she has noted that her right ring finger has been getting stuck. Patient states that she has been stretching it but it has not getting better.  Patient also concerned about a rash to her bilateral legs x3 weeks.  Patient states that she was in the garden and noticed the rash shortly after.  Patient has been using hydrocortisone over-the-counter which seems to help a little bit but has not gotten rid of the rash completely.  Patient states that the rash itches occasionally and is continuing to be red.  Patient states that she was seen by a telehealth doc who increased her levothyroxine to 100 mcg from 88 mcg due to her complaint of fatigue.  Patient would like a refill of 100 mcg because she states that she feels better on it.  Patient states that she has been on 100 mcg of levothyroxine for about 8 to 10 weeks.  Patient has no other concerns   Review of Systems  Musculoskeletal:        Left heel pain and right ring finger pain  Skin:  Positive for rash.       Objective:   Physical Exam Vitals reviewed.  Constitutional:      General: She is not in acute distress.    Appearance: Normal appearance. She is normal weight. She is not ill-appearing, toxic-appearing or diaphoretic.  HENT:     Head: Normocephalic and atraumatic.  Cardiovascular:     Rate and Rhythm: Normal rate and regular rhythm.     Pulses: Normal pulses.     Heart sounds: Normal heart sounds. No murmur heard. Pulmonary:     Effort: Pulmonary effort is  normal. No respiratory distress.     Breath sounds: Normal breath sounds. No wheezing.  Musculoskeletal:     Comments: Grossly intact.  Nodule noted to the back of patient's left heel.  Heel tender to palpation.  No tenderness with movement.  Range of motion intact.  Right hand examined.  No nodules noted to palm.  Base of ring finger tender to palpation  Skin:    General: Skin is warm.     Capillary Refill: Capillary refill takes less than 2 seconds.  Neurological:     Mental Status: She is alert.     Comments: Grossly intact  Psychiatric:        Mood and Affect: Mood normal.        Behavior: Behavior normal.           Assessment & Plan:   1. Pain of left heel -Suspect Achilles tendinitis however will rule out stress fractures - DG Foot Complete Left -If x-ray negative will refer patient to physical therapy and encourage NSAIDs for 7 to 10 days -Return to clinic if symptoms worsen or do not improve   2. Trigger ring finger of right hand -Suspect trigger finger -Discussed stretching finger -We will refer to Ortho for evaluation and/or corticosteroid injections if needed  3. Rash -We will treat  with steroid cream - triamcinolone cream (KENALOG) 0.1 %; Apply 1 Application topically 2 (two) times daily.  Dispense: 30 g; Refill: 0 -Return to clinic if symptoms do not improve or worsen  4. Other specified hypothyroidism -Last levothyroxine issued by our office was 88 mcg -Will recheck thyroid hormones to determine if 100 mcg is still the appropriate dose and will adjust as necessary. - TSH + free T4  5. Iron deficiency anemia secondary to inadequate dietary iron intake - CMP14+EGFR - CBC with Differential  6. Lipid screening - Lipid Profile    Note:  This document was prepared using Dragon voice recognition software and may include unintentional dictation errors. Note - This record has been created using [employer].  Chart creation errors have been sought,  but may not always  have been located. Such creation errors do not reflect on  the standard of medical care.

## 2022-07-05 LAB — CBC WITH DIFFERENTIAL/PLATELET
Basophils Absolute: 0.1 10*3/uL (ref 0.0–0.2)
Basos: 1 %
EOS (ABSOLUTE): 0.2 10*3/uL (ref 0.0–0.4)
Eos: 4 %
Hematocrit: 39.8 % (ref 34.0–46.6)
Hemoglobin: 13.2 g/dL (ref 11.1–15.9)
Immature Grans (Abs): 0 10*3/uL (ref 0.0–0.1)
Immature Granulocytes: 0 %
Lymphocytes Absolute: 1.7 10*3/uL (ref 0.7–3.1)
Lymphs: 35 %
MCH: 30.7 pg (ref 26.6–33.0)
MCHC: 33.2 g/dL (ref 31.5–35.7)
MCV: 93 fL (ref 79–97)
Monocytes Absolute: 0.7 10*3/uL (ref 0.1–0.9)
Monocytes: 13 %
Neutrophils Absolute: 2.3 10*3/uL (ref 1.4–7.0)
Neutrophils: 47 %
Platelets: 270 10*3/uL (ref 150–450)
RBC: 4.3 x10E6/uL (ref 3.77–5.28)
RDW: 12 % (ref 11.7–15.4)
WBC: 4.9 10*3/uL (ref 3.4–10.8)

## 2022-07-05 LAB — CMP14+EGFR
ALT: 22 IU/L (ref 0–32)
AST: 25 IU/L (ref 0–40)
Albumin/Globulin Ratio: 1.7 (ref 1.2–2.2)
Albumin: 4.4 g/dL (ref 3.8–4.9)
Alkaline Phosphatase: 125 IU/L — ABNORMAL HIGH (ref 44–121)
BUN/Creatinine Ratio: 12 (ref 12–28)
BUN: 9 mg/dL (ref 8–27)
Bilirubin Total: 0.5 mg/dL (ref 0.0–1.2)
CO2: 25 mmol/L (ref 20–29)
Calcium: 9.7 mg/dL (ref 8.7–10.3)
Chloride: 96 mmol/L (ref 96–106)
Creatinine, Ser: 0.75 mg/dL (ref 0.57–1.00)
Globulin, Total: 2.6 g/dL (ref 1.5–4.5)
Glucose: 96 mg/dL (ref 70–99)
Potassium: 4.8 mmol/L (ref 3.5–5.2)
Sodium: 135 mmol/L (ref 134–144)
Total Protein: 7 g/dL (ref 6.0–8.5)
eGFR: 91 mL/min/{1.73_m2} (ref >59)

## 2022-07-05 LAB — TSH+FREE T4
Free T4: 1.35 ng/dL (ref 0.82–1.77)
TSH: 2.75 u[IU]/mL (ref 0.450–4.500)

## 2022-07-05 LAB — LIPID PANEL
Chol/HDL Ratio: 1.7 ratio (ref 0.0–4.4)
Cholesterol, Total: 215 mg/dL — ABNORMAL HIGH (ref 100–199)
HDL: 123 mg/dL (ref >39)
LDL Chol Calc (NIH): 83 mg/dL (ref 0–99)
Triglycerides: 48 mg/dL (ref 0–149)
VLDL Cholesterol Cal: 9 mg/dL (ref 5–40)

## 2022-07-05 NOTE — Progress Notes (Deleted)
[facility] [phone]   [phone]  PROGRESS NOTE  Patient Care Team: Kathyrn Drown, MD as PCP - General (Family Medicine) Danie Binder, MD (Inactive) (Gastroenterology) Lahoma Rocker, MD as Consulting Physician (Rheumatology) Lavonna Monarch, MD (Inactive) as Consulting Physician (Dermatology)  Hematological/Oncological History # Iron Deficiency Anemia 2/2 to Gastric Bipass 10/07/2020: WBC 5.2, Hgb 10.7, MCV 81, Plt 293. Iron 30, TIBC 447, Ferritin 9, Iron sat 7%.  10/30/2020: establish care with Dr. Lorenso Courier  11/13/2020-12/11/2020: 5 doses of IV iron sucrose '200mg'$  weekly 12/18/2020: WBC 4.5, Hgb 12.2, MCV 83.2, Plt 244 03/25/2021: WBC 5.1, Hgb 14.0, MCV 96.6, Plt 206  Interval History:  [name] 61 y.o. female with medical history significant for iron deficiency anemia 2/2 to Gastric bipass who presents for a follow up visit. The patient's last visit was on 03/25/2022. In the interim since the last visit she has had no major changes in health.  On exam today [name] reports*** A full 10 point ROS is listed below.  MEDICAL HISTORY:  Past Medical History:  Diagnosis Date   Abdominal pain of unknown etiology    Anal fistula    hx of    Aortic atherosclerosis (HCC)    Constipation    severe, history of   DDD (degenerative disc disease), lumbar    Depression    Diarrhea    history of   GERD (gastroesophageal reflux disease)    History of hiatal hernia    Hypothyroidism    Iron deficiency anemia    Left shoulder pain    Low back pain    Lumbar herniated disc    Neck pain    Numbness of right lower extremity    Olecranon bursitis    Plantar fasciitis, left    Prediabetes    RA (rheumatoid arthritis) (HCC)    Right carpal tunnel syndrome 01/09/2018   S/P colonoscopy April 2011   Dr. Olevia Perches: mild diverticulosis, hyperplastic polyps, repeat in 10 years   S/P endoscopy April 2011   Dr. Olevia Perches: no hiatal hernia, generous opening  to antrum., patent gastrojejunostomy   Sciatic leg pain    Right   Ulnar neuropathy at elbow, right 01/09/2018   Vitamin D deficiency    Weakness of right lower extremity     SURGICAL HISTORY: Past Surgical History:  Procedure Laterality Date   anal fistulostomy     w/marsupialization an excision of anal tags   BACK SURGERY  10/2017   BREAST LUMPECTOMY Right    benign   CARPAL TUNNEL WITH CUBITAL TUNNEL Right 02/06/2018   Procedure: CARPAL TUNNEL RELEASE RIGHT TENOSYNOVECTOMY;  Surgeon: Daryll Brod, MD;  Location: [place];  Service: Orthopedics;  Laterality: Right;  regional block   COLONOSCOPY     ENDOMETRIAL ABLATION     ESOPHAGOGASTRODUODENOSCOPY     GASTRIC BYPASS     mini gastric bypass in [place] reportedly   ROTATOR CUFF REPAIR  07/2017   ULNAR NERVE TRANSPOSITION Right 02/06/2018   Procedure: ULNAR NERVE DECOMPRESSION;  Surgeon: Daryll Brod, MD;  Location: [place];  Service: Orthopedics;  Laterality: Right;  regional block    ULNAR TUNNEL RELEASE Right 02/06/2018   Procedure: CUBITAL TUNNEL RELEASE;  Surgeon: Daryll Brod, MD;  Location: [place];  Service: Orthopedics;  Laterality: Right;  regoinal block    SOCIAL HISTORY: Social History   Socioeconomic History   Marital status: Married    Spouse name: Not on  file   Number of children: Not on file   Years of education: Not on file   Highest education level: Not on file  Occupational History   Occupation: Technician    Comment: [employer]   Tobacco Use   Smoking status: Former   Smokeless tobacco: Never   Tobacco comments:    quit 2006  Vaping Use   Vaping Use: Never used  Substance and Sexual Activity   Alcohol use: Yes    Alcohol/week: 1.0 standard drink of alcohol    Types: 1 Standard drinks or equivalent per week    Comment: social drinker   Drug use: No   Sexual activity: Yes    Partners: Male    Birth control/protection: None     Comment: spouse  Other Topics Concern   Not on file  Social History Narrative   Not on file   Social Determinants of Health   Financial Resource Strain: Not on file  Food Insecurity: Not on file  Transportation Needs: Not on file  Physical Activity: Not on file  Stress: Not on file  Social Connections: Not on file  Intimate Partner Violence: Not on file    FAMILY HISTORY: Family History  Problem Relation Age of Onset   COPD Mother        living   Heart disease Father        deceased   Colon cancer Neg Hx     ALLERGIES:  is allergic to penicillins.  MEDICATIONS:  Current Outpatient Medications  Medication Sig Dispense Refill   Calcium Carbonate-Vitamin D (CALCIUM 600/VITAMIN D PO) Take 1 tablet by mouth daily.     Cholecalciferol (VITAMIN D3) 50 MCG (2000 UT) capsule Take 2,000 Units by mouth daily.     DHEA 50 MG CAPS Take 50 mg by mouth daily.     folic acid (FOLVITE) 1 MG tablet Take 1 mg by mouth daily.  0   HUMIRA PEN 40 MG/0.4ML PNKT SMARTSIG:40 Milligram(s) SUB-Q Every 2 Weeks     HYDROcodone-acetaminophen (NORCO/VICODIN) 5-325 MG tablet Take 1 tablet by mouth every 6 (six) hours as needed. 30 tablet 0   Lactobacillus (PROBIOTIC ACIDOPHILUS PO) Take by mouth.     levothyroxine (SYNTHROID) 88 MCG tablet TAKE 1 TABLET(88 MCG) BY MOUTH DAILY 90 tablet 1   methotrexate (RHEUMATREX) 2.5 MG tablet Take 3 tablets (7.5 mg total) by mouth See admin instructions. Take 7.5 mg by mouth on Monday and Tuesday 4 tablet 0   triamcinolone cream (KENALOG) 0.1 % Apply 1 Application topically 2 (two) times daily. 30 g 0   vitamin B-12 (CYANOCOBALAMIN) 1000 MCG tablet Take 1,000 mcg by mouth daily.     No current facility-administered medications for this visit.    REVIEW OF SYSTEMS:   Constitutional: ( - ) fevers, ( - )  chills , ( - ) night sweats Eyes: ( - ) blurriness of vision, ( - ) double vision, ( - ) watery eyes Ears, nose, mouth, throat, and face: ( - ) mucositis, ( - )  sore throat Respiratory: ( - ) cough, ( - ) dyspnea, ( - ) wheezes Cardiovascular: ( - ) palpitation, ( - ) chest discomfort, ( - ) lower extremity swelling Gastrointestinal:  ( - ) nausea, ( - ) heartburn, ( - ) change in bowel habits Skin: ( - ) abnormal skin rashes Lymphatics: ( - ) new lymphadenopathy, ( - ) easy bruising Neurological: ( - ) numbness, ( - ) tingling, ( - )  new weaknesses Behavioral/Psych: ( - ) mood change, ( - ) new changes  All other systems were reviewed with the patient and are negative.  PHYSICAL EXAMINATION:  There were no vitals filed for this visit.   There were no vitals filed for this visit.    GENERAL: well appearing middle aged Caucasian female alert, no distress and comfortable SKIN: skin color, texture, turgor are normal, no rashes or significant lesions EYES: conjunctiva are pink and non-injected, sclera clear LUNGS: clear to auscultation and percussion with normal breathing effort HEART: regular rate & rhythm and no murmurs and no lower extremity edema Musculoskeletal: no cyanosis of digits and no clubbing  PSYCH: alert & oriented x 3, fluent speech NEURO: no focal motor/sensory deficits  LABORATORY DATA:  I have reviewed the data as listed    Latest Ref Rng & Units 07/04/2022    9:41 AM 03/25/2021   11:50 AM 12/18/2020    2:22 PM  CBC  WBC 3.4 - 10.8 x10E3/uL 4.9  5.1  4.5   Hemoglobin 11.1 - 15.9 g/dL 13.2  14.0  12.2   Hematocrit 34.0 - 46.6 % 39.8  42.7  37.1   Platelets 150 - 450 x10E3/uL 270  206  244        Latest Ref Rng & Units 07/04/2022    9:41 AM 03/25/2021   11:50 AM 12/18/2020    2:22 PM  CMP  Glucose 70 - 99 mg/dL 96  94  93   BUN 8 - 27 mg/dL '9  13  19   '$ Creatinine 0.57 - 1.00 mg/dL 0.75  0.78  0.78   Sodium 134 - 144 mmol/L 135  139  132   Potassium 3.5 - 5.2 mmol/L 4.8  4.1  3.9   Chloride 96 - 106 mmol/L 96  103  97   CO2 20 - 29 mmol/L '25  26  25   '$ Calcium 8.7 - 10.3 mg/dL 9.7  9.5  9.1   Total Protein 6.0 - 8.5  g/dL 7.0  7.6  7.3   Total Bilirubin 0.0 - 1.2 mg/dL 0.5  0.4  0.4   Alkaline Phos 44 - 121 IU/L 125  102  113   AST 0 - 40 IU/L 25  45  34   ALT 0 - 32 IU/L 22  43  39     RADIOGRAPHIC STUDIES: DG Foot Complete Left  Result Date: 07/04/2022 CLINICAL DATA:  Foot pain.  Pain in left heel.  No known injury. EXAM: LEFT FOOT - COMPLETE 3+ VIEW COMPARISON:  Radiograph 10/02/2017 FINDINGS: No acute or evidence of prior fracture. Stable medial angulation of the great toe at the interphalangeal joint. Otherwise normal alignment. Minimal degenerative spurring of the first metatarsal phalangeal joint. There is a small plantar calcaneal spur and Achilles tendon enthesophyte. No erosion, periostitis or bony destruction. No focal soft tissue abnormalities are seen. IMPRESSION: 1. Small plantar calcaneal spur and Achilles tendon enthesophyte. 2. Minimal degenerative spurring of the first metatarsophalangeal joint. Electronically Signed   By: Keith Rake M.D.   On: 07/04/2022 19:42    ASSESSMENT & PLAN SENAIDA CHILCOTE 61 y.o. female with medical history significant for iron deficiency anemia 2/2 to Gastric bipass who presents for a follow up visit.   After review the labs, the records, schedule the patient the findings most consistent with iron deficiency anemia secondary to a gastric bypass. She received the IV sucrose treatment x5 doses and had a increase in her hemoglobin up  normal levels. Additionally her ferritin is above goal of 100 and her TIBC is returned to normal. Overall she is had an excellent response to the IV iron therapy. Given that the likely cause is poor absorption due to her gastric bypass we recommended she follow-up in 3 months time in order to assure that her iron stores are still replete. We will need at least periodic follow-up with her every 6 to 12 months in order to assure her iron levels are at goal.  # Iron Deficiency Anemia 2/2 to Gastric Bipass --patient completed 5 doses of  IV iron sucrose '200mg'$  weekly from 11/13/2020-12/11/2020 --Hgb has improved and most symptoms have resolved. Patient does have a persistent lack of energy --due to gastric bipass may require future IV iron treaments --recommend RTC in 6 months time to assure her iron stores remain replete.   No orders of the defined types were placed in this encounter.    All questions were answered. The patient knows to call the clinic with any problems, questions or concerns.  A total of more than 30 minutes were spent on this encounter and over half of that time was spent on counseling and coordination of care as outlined above.   Ledell Peoples, MD Department of Hematology/Oncology [facility] Phone: [phone] Pager: [phone] Email: [email or web address]  07/05/2022 5:42 PM

## 2022-07-06 ENCOUNTER — Inpatient Hospital Stay: Payer: 59 | Attending: Hematology and Oncology

## 2022-07-06 ENCOUNTER — Other Ambulatory Visit: Payer: Self-pay | Admitting: Nurse Practitioner

## 2022-07-06 ENCOUNTER — Inpatient Hospital Stay: Payer: 59 | Admitting: Hematology and Oncology

## 2022-07-06 DIAGNOSIS — M79672 Pain in left foot: Secondary | ICD-10-CM

## 2022-07-08 MED ORDER — LEVOTHYROXINE SODIUM 100 MCG PO TABS: 100.0000 ug | ORAL_TABLET | Freq: Every day | ORAL | 1 refills | Status: DC

## 2022-07-08 NOTE — Addendum Note (Signed)
Addended by: Dairl Ponder on: 07/08/2022 10:34 AM   Modules accepted: Orders

## 2022-07-11 ENCOUNTER — Ambulatory Visit: Payer: 59 | Admitting: Orthopedic Surgery

## 2022-07-11 ENCOUNTER — Encounter: Payer: Self-pay | Admitting: Orthopedic Surgery

## 2022-07-11 VITALS — BP 129/57 | HR 59 | Ht 64.0 in | Wt 220.0 lb

## 2022-07-11 DIAGNOSIS — M65331 Trigger finger, right middle finger: Secondary | ICD-10-CM

## 2022-07-11 DIAGNOSIS — G5700 Lesion of sciatic nerve, unspecified lower limb: Secondary | ICD-10-CM | POA: Insufficient documentation

## 2022-07-11 DIAGNOSIS — M7662 Achilles tendinitis, left leg: Secondary | ICD-10-CM | POA: Diagnosis not present

## 2022-07-11 DIAGNOSIS — M51379 Other intervertebral disc degeneration, lumbosacral region without mention of lumbar back pain or lower extremity pain: Secondary | ICD-10-CM | POA: Insufficient documentation

## 2022-07-11 DIAGNOSIS — G8929 Other chronic pain: Secondary | ICD-10-CM | POA: Insufficient documentation

## 2022-07-11 DIAGNOSIS — M5137 Other intervertebral disc degeneration, lumbosacral region: Secondary | ICD-10-CM | POA: Insufficient documentation

## 2022-07-11 DIAGNOSIS — E669 Obesity, unspecified: Secondary | ICD-10-CM | POA: Insufficient documentation

## 2022-07-11 DIAGNOSIS — R300 Dysuria: Secondary | ICD-10-CM | POA: Insufficient documentation

## 2022-07-11 DIAGNOSIS — M65341 Trigger finger, right ring finger: Secondary | ICD-10-CM

## 2022-07-11 DIAGNOSIS — M6530 Trigger finger, unspecified finger: Secondary | ICD-10-CM

## 2022-07-11 NOTE — Progress Notes (Signed)
New patient evaluation  2 complaints 1 trigger phenomenon right hand and the second complaint is prominence left heel  This is a 61 year old female who presents with noticeable bump on the back of her left foot without any symptoms really.  She also has trigger phenomenon right hand with 2 locking episodes.  She did some stretching and massage and it seems to be getting better  Pain is over the A1 pulley  Review of systems negative  Past Medical History:  Diagnosis Date   Abdominal pain of unknown etiology    Anal fistula    hx of    Aortic atherosclerosis (HCC)    Constipation    severe, history of   DDD (degenerative disc disease), lumbar    Depression    Diarrhea    history of   GERD (gastroesophageal reflux disease)    History of hiatal hernia    Hypothyroidism    Iron deficiency anemia    Left shoulder pain    Low back pain    Lumbar herniated disc    Neck pain    Numbness of right lower extremity    Olecranon bursitis    Plantar fasciitis, left    Prediabetes    RA (rheumatoid arthritis) (HCC)    Right carpal tunnel syndrome 01/09/2018   S/P colonoscopy April 2011   Dr. Olevia Perches: mild diverticulosis, hyperplastic polyps, repeat in 10 years   S/P endoscopy April 2011   Dr. Olevia Perches: no hiatal hernia, generous opening to antrum., patent gastrojejunostomy   Sciatic leg pain    Right   Ulnar neuropathy at elbow, right 01/09/2018   Vitamin D deficiency    Weakness of right lower extremity    BP (!) 129/57   Pulse (!) 59   Ht '5\' 4"'$  (1.626 m)   Wt 220 lb (99.8 kg)   BMI 37.76 kg/m   General appearance is normal grooming hygiene are normal  Cardiovascular exam of the right hand normal color capillary refill and temperature normal pulses  Skin normal right hand and left foot  Right hand examination there is tenderness over the A1 pulley of the ring finger and some of the long finger as well there is no catching locking or triggering flexor tendons are normal as are  extensor tendons  Left foot there is prominence of the back of the heel there is 1 little tender spot Achilles strength is normal  X-rays show a small plantar spur and some calcification where the insertion of the Achilles tendon meets the calcaneus  Both areas are relatively asymptomatic.  So at this point I would just treat everything symptomatically come back as needed

## 2022-08-23 ENCOUNTER — Inpatient Hospital Stay (HOSPITAL_BASED_OUTPATIENT_CLINIC_OR_DEPARTMENT_OTHER): Payer: 59 | Admitting: Hematology and Oncology

## 2022-08-23 ENCOUNTER — Inpatient Hospital Stay: Payer: 59 | Attending: Hematology and Oncology

## 2022-08-23 ENCOUNTER — Other Ambulatory Visit: Payer: Self-pay | Admitting: Hematology and Oncology

## 2022-08-23 VITALS — BP 124/71 | HR 56 | Temp 97.2°F | Resp 15 | Wt 205.2 lb

## 2022-08-23 DIAGNOSIS — Z9884 Bariatric surgery status: Secondary | ICD-10-CM

## 2022-08-23 DIAGNOSIS — E039 Hypothyroidism, unspecified: Secondary | ICD-10-CM | POA: Insufficient documentation

## 2022-08-23 DIAGNOSIS — D500 Iron deficiency anemia secondary to blood loss (chronic): Secondary | ICD-10-CM

## 2022-08-23 DIAGNOSIS — Z79899 Other long term (current) drug therapy: Secondary | ICD-10-CM | POA: Insufficient documentation

## 2022-08-23 DIAGNOSIS — Z79890 Hormone replacement therapy (postmenopausal): Secondary | ICD-10-CM | POA: Insufficient documentation

## 2022-08-23 DIAGNOSIS — Z880 Allergy status to penicillin: Secondary | ICD-10-CM | POA: Diagnosis not present

## 2022-08-23 DIAGNOSIS — D508 Other iron deficiency anemias: Secondary | ICD-10-CM

## 2022-08-23 DIAGNOSIS — Z8249 Family history of ischemic heart disease and other diseases of the circulatory system: Secondary | ICD-10-CM | POA: Diagnosis not present

## 2022-08-23 DIAGNOSIS — Z825 Family history of asthma and other chronic lower respiratory diseases: Secondary | ICD-10-CM | POA: Insufficient documentation

## 2022-08-23 DIAGNOSIS — Z8719 Personal history of other diseases of the digestive system: Secondary | ICD-10-CM | POA: Insufficient documentation

## 2022-08-23 LAB — CMP (CANCER CENTER ONLY)
ALT: 22 U/L (ref 0–44)
AST: 20 U/L (ref 15–41)
Albumin: 4.1 g/dL (ref 3.5–5.0)
Alkaline Phosphatase: 97 U/L (ref 38–126)
Anion gap: 7 (ref 5–15)
BUN: 11 mg/dL (ref 6–20)
CO2: 28 mmol/L (ref 22–32)
Calcium: 9.5 mg/dL (ref 8.9–10.3)
Chloride: 97 mmol/L — ABNORMAL LOW (ref 98–111)
Creatinine: 0.67 mg/dL (ref 0.44–1.00)
GFR, Estimated: >60 mL/min (ref >60)
Glucose, Bld: 89 mg/dL (ref 70–99)
Potassium: 3.9 mmol/L (ref 3.5–5.1)
Sodium: 132 mmol/L — ABNORMAL LOW (ref 135–145)
Total Bilirubin: 0.5 mg/dL (ref 0.3–1.2)
Total Protein: 7.3 g/dL (ref 6.5–8.1)

## 2022-08-23 LAB — CBC WITH DIFFERENTIAL (CANCER CENTER ONLY)
Abs Immature Granulocytes: 0.01 10*3/uL (ref 0.00–0.07)
Basophils Absolute: 0.1 10*3/uL (ref 0.0–0.1)
Basophils Relative: 1 %
Eosinophils Absolute: 0.1 10*3/uL (ref 0.0–0.5)
Eosinophils Relative: 2 %
HCT: 37.9 % (ref 36.0–46.0)
Hemoglobin: 13.3 g/dL (ref 12.0–15.0)
Immature Granulocytes: 0 %
Lymphocytes Relative: 33 %
Lymphs Abs: 1.7 10*3/uL (ref 0.7–4.0)
MCH: 31.5 pg (ref 26.0–34.0)
MCHC: 35.1 g/dL (ref 30.0–36.0)
MCV: 89.8 fL (ref 80.0–100.0)
Monocytes Absolute: 0.6 10*3/uL (ref 0.1–1.0)
Monocytes Relative: 12 %
Neutro Abs: 2.7 10*3/uL (ref 1.7–7.7)
Neutrophils Relative %: 52 %
Platelet Count: 287 10*3/uL (ref 150–400)
RBC: 4.22 MIL/uL (ref 3.87–5.11)
RDW: 12.6 % (ref 11.5–15.5)
WBC Count: 5.3 10*3/uL (ref 4.0–10.5)
nRBC: 0 % (ref 0.0–0.2)

## 2022-08-23 LAB — RETIC PANEL
Immature Retic Fract: 5.5 % (ref 2.3–15.9)
RBC.: 4.24 MIL/uL (ref 3.87–5.11)
Retic Count, Absolute: 56.4 10*3/uL (ref 19.0–186.0)
Retic Ct Pct: 1.3 % (ref 0.4–3.1)
Reticulocyte Hemoglobin: 35.3 pg (ref >27.9)

## 2022-08-23 LAB — IRON AND IRON BINDING CAPACITY (CC-WL,HP ONLY)
Iron: 136 ug/dL (ref 28–170)
Saturation Ratios: 34 % — ABNORMAL HIGH (ref 10.4–31.8)
TIBC: 395 ug/dL (ref 250–450)
UIBC: 259 ug/dL (ref 148–442)

## 2022-08-23 LAB — FERRITIN: Ferritin: 38 ng/mL (ref 11–307)

## 2022-08-23 LAB — VITAMIN B12: Vitamin B-12: 1183 pg/mL — ABNORMAL HIGH (ref 180–914)

## 2022-08-23 NOTE — Progress Notes (Signed)
[name] Telephone:([phone]) [phone]   [phone]  PROGRESS NOTE  Patient Care Team: Kathyrn Drown, MD as PCP - General (Family Medicine) Danie Binder, MD (Inactive) (Gastroenterology) Lahoma Rocker, MD as Consulting Physician (Rheumatology) Lavonna Monarch, MD (Inactive) as Consulting Physician (Dermatology)  Hematological/Oncological History # Iron Deficiency Anemia 2/2 to Gastric Bypass 10/07/2020: WBC 5.2, Hgb 10.7, MCV 81, Plt 293. Iron 30, TIBC 447, Ferritin 9, Iron sat 7%.  10/30/2020: establish care with Dr. Lorenso Courier  11/13/2020-12/11/2020: 5 doses of IV iron sucrose '200mg'$  weekly 12/18/2020: WBC 4.5, Hgb 12.2, MCV 83.2, Plt 244 03/25/2021: WBC 5.1, Hgb 14.0, MCV 96.6, Plt 206  Interval History:  [name] 61 y.o. female with medical history significant for iron deficiency anemia 2/2 to Gastric bipass who presents for a follow up visit. The patient's last visit was on 03/25/2021. In the interim since the last visit she was lost to follow up.   On exam today [name] reports she has been doing well in the interim since her last visit.  She does have issues with getting cold though she is "cold natured".  She notes her energy levels are good though she has been dieting lately.  She is currently down to 205 pounds, having lost 19 pounds intentionally.  She reports that she is not having any overt signs of bleeding anywhere.  She is undergoing intermittent fasting and working with [employer] loss program.  She mostly achieved this through calorie restriction and only eating during certain hours of the day.  She does not eat much in the way of red meat.  She otherwise denies any fevers, chills, sweats, nausea, vomiting or diarrhea. She has had no overt signs of bleeding, bruising, or dark stools. She has not made an attempt to increase her diet and include any more iron rich foods. A full 10 point ROS is listed below.  MEDICAL HISTORY:  Past Medical  History:  Diagnosis Date   Abdominal pain of unknown etiology    Anal fistula    hx of    Aortic atherosclerosis (HCC)    Constipation    severe, history of   DDD (degenerative disc disease), lumbar    Depression    Diarrhea    history of   GERD (gastroesophageal reflux disease)    History of hiatal hernia    Hypothyroidism    Iron deficiency anemia    Left shoulder pain    Low back pain    Lumbar herniated disc    Neck pain    Numbness of right lower extremity    Olecranon bursitis    Plantar fasciitis, left    Prediabetes    RA (rheumatoid arthritis) (HCC)    Right carpal tunnel syndrome 01/09/2018   S/P colonoscopy April 2011   Dr. Olevia Perches: mild diverticulosis, hyperplastic polyps, repeat in 10 years   S/P endoscopy April 2011   Dr. Olevia Perches: no hiatal hernia, generous opening to antrum., patent gastrojejunostomy   Sciatic leg pain    Right   Ulnar neuropathy at elbow, right 01/09/2018   Vitamin D deficiency    Weakness of right lower extremity     SURGICAL HISTORY: Past Surgical History:  Procedure Laterality Date   anal fistulostomy     w/marsupialization an excision of anal tags   BACK SURGERY  10/2017   BREAST LUMPECTOMY Right    benign   CARPAL TUNNEL WITH CUBITAL TUNNEL Right 02/06/2018   Procedure: CARPAL TUNNEL RELEASE RIGHT TENOSYNOVECTOMY;  Surgeon: Daryll Brod, MD;  Location: [facility];  Service: Orthopedics;  Laterality: Right;  regional block   COLONOSCOPY     ENDOMETRIAL ABLATION     ESOPHAGOGASTRODUODENOSCOPY     GASTRIC BYPASS     mini gastric bypass in [place] reportedly   ROTATOR CUFF REPAIR  07/2017   ULNAR NERVE TRANSPOSITION Right 02/06/2018   Procedure: ULNAR NERVE DECOMPRESSION;  Surgeon: Daryll Brod, MD;  Location: [place];  Service: Orthopedics;  Laterality: Right;  regional block    ULNAR TUNNEL RELEASE Right 02/06/2018   Procedure: CUBITAL TUNNEL RELEASE;  Surgeon: Daryll Brod, MD;  Location: [place];  Service: Orthopedics;  Laterality: Right;  regoinal block    SOCIAL HISTORY: Social History   Socioeconomic History   Marital status: Married    Spouse name: Not on file   Number of children: Not on file   Years of education: Not on file   Highest education level: Not on file  Occupational History   Occupation: Technician    Comment: [employer]   Tobacco Use   Smoking status: Former   Smokeless tobacco: Never   Tobacco comments:    quit 2006  Vaping Use   Vaping Use: Never used  Substance and Sexual Activity   Alcohol use: Yes    Alcohol/week: 1.0 standard drink of alcohol    Types: 1 Standard drinks or equivalent per week    Comment: social drinker   Drug use: No   Sexual activity: Yes    Partners: Male    Birth control/protection: None    Comment: spouse  Other Topics Concern   Not on file  Social History Narrative   Not on file   Social Determinants of Health   Financial Resource Strain: Not on file  Food Insecurity: Not on file  Transportation Needs: Not on file  Physical Activity: Not on file  Stress: Not on file  Social Connections: Not on file  Intimate Partner Violence: Not on file    FAMILY HISTORY: Family History  Problem Relation Age of Onset   COPD Mother        living   Heart disease Father        deceased   Colon cancer Neg Hx     ALLERGIES:  is allergic to penicillins.  MEDICATIONS:  Current Outpatient Medications  Medication Sig Dispense Refill   Calcium Carbonate-Vitamin D (CALCIUM 600/VITAMIN D PO) Take 1 tablet by mouth daily.     Cholecalciferol (VITAMIN D3) 50 MCG (2000 UT) capsule Take 2,000 Units by mouth daily.     DHEA 50 MG CAPS Take 50 mg by mouth daily.     folic acid (FOLVITE) 1 MG tablet Take 1 mg by mouth daily.  0   HUMIRA PEN 40 MG/0.4ML PNKT SMARTSIG:40 Milligram(s) SUB-Q Every 2 Weeks     HYDROcodone-acetaminophen (NORCO/VICODIN) 5-325 MG tablet Take 1 tablet by mouth every 6 (six)  hours as needed. 30 tablet 0   Lactobacillus (PROBIOTIC ACIDOPHILUS PO) Take by mouth.     levothyroxine (SYNTHROID) 100 MCG tablet Take 1 tablet (100 mcg total) by mouth daily. 90 tablet 1   methotrexate (RHEUMATREX) 2.5 MG tablet Take 3 tablets (7.5 mg total) by mouth See admin instructions. Take 7.5 mg by mouth on Monday and Tuesday 4 tablet 0   triamcinolone cream (KENALOG) 0.1 % Apply 1 Application topically 2 (two) times daily. 30 g 0   vitamin B-12 (CYANOCOBALAMIN) 1000  MCG tablet Take 1,000 mcg by mouth daily.     No current facility-administered medications for this visit.    REVIEW OF SYSTEMS:   Constitutional: ( - ) fevers, ( - )  chills , ( - ) night sweats Eyes: ( - ) blurriness of vision, ( - ) double vision, ( - ) watery eyes Ears, nose, mouth, throat, and face: ( - ) mucositis, ( - ) sore throat Respiratory: ( - ) cough, ( - ) dyspnea, ( - ) wheezes Cardiovascular: ( - ) palpitation, ( - ) chest discomfort, ( - ) lower extremity swelling Gastrointestinal:  ( - ) nausea, ( - ) heartburn, ( - ) change in bowel habits Skin: ( - ) abnormal skin rashes Lymphatics: ( - ) new lymphadenopathy, ( - ) easy bruising Neurological: ( - ) numbness, ( - ) tingling, ( - ) new weaknesses Behavioral/Psych: ( - ) mood change, ( - ) new changes  All other systems were reviewed with the patient and are negative.  PHYSICAL EXAMINATION:  Vitals:   08/23/22 1024  BP: 124/71  Pulse: (!) 56  Resp: 15  Temp: (!) 97.2 F (36.2 C)  SpO2: 99%     Filed Weights   08/23/22 1024  Weight: 205 lb 3.2 oz (93.1 kg)      GENERAL: well appearing middle aged Caucasian female alert, no distress and comfortable SKIN: skin color, texture, turgor are normal, no rashes or significant lesions EYES: conjunctiva are pink and non-injected, sclera clear LUNGS: clear to auscultation and percussion with normal breathing effort HEART: regular rate & rhythm and no murmurs and no lower extremity  edema Musculoskeletal: no cyanosis of digits and no clubbing  PSYCH: alert & oriented x 3, fluent speech NEURO: no focal motor/sensory deficits  LABORATORY DATA:  I have reviewed the data as listed    Latest Ref Rng & Units 08/23/2022    9:42 AM 07/04/2022    9:41 AM 03/25/2021   11:50 AM  CBC  WBC 4.0 - 10.5 K/uL 5.3  4.9  5.1   Hemoglobin 12.0 - 15.0 g/dL 13.3  13.2  14.0   Hematocrit 36.0 - 46.0 % 37.9  39.8  42.7   Platelets 150 - 400 K/uL 287  270  206        Latest Ref Rng & Units 08/23/2022    9:42 AM 07/04/2022    9:41 AM 03/25/2021   11:50 AM  CMP  Glucose 70 - 99 mg/dL 89  96  94   BUN 6 - 20 mg/dL '11  9  13   '$ Creatinine 0.44 - 1.00 mg/dL 0.67  0.75  0.78   Sodium 135 - 145 mmol/L 132  135  139   Potassium 3.5 - 5.1 mmol/L 3.9  4.8  4.1   Chloride 98 - 111 mmol/L 97  96  103   CO2 22 - 32 mmol/L '28  25  26   '$ Calcium 8.9 - 10.3 mg/dL 9.5  9.7  9.5   Total Protein 6.5 - 8.1 g/dL 7.3  7.0  7.6   Total Bilirubin 0.3 - 1.2 mg/dL 0.5  0.5  0.4   Alkaline Phos 38 - 126 U/L 97  125  102   AST 15 - 41 U/L 20  25  45   ALT 0 - 44 U/L 22  22  43     RADIOGRAPHIC STUDIES: No results found.  ASSESSMENT & PLAN [name] 61 y.o. female with  medical history significant for iron deficiency anemia 2/2 to Gastric bypass who presents for a follow up visit.   After review the labs, the records, schedule the patient the findings most consistent with iron deficiency anemia secondary to a gastric bypass. She received the IV sucrose treatment x5 doses and had a increase in her hemoglobin up normal levels. Additionally her ferritin is above goal of 100 and her TIBC is returned to normal. Overall she is had an excellent response to the IV iron therapy. Given that the likely cause is poor absorption due to her gastric bypass we recommended she follow-up in 3 months time in order to assure that her iron stores are still replete. We will need at least periodic follow-up with her every 6 to  12 months in order to assure her iron levels are at goal.  # Iron Deficiency Anemia 2/2 to Gastric Bypass --patient completed 5 doses of IV iron sucrose '200mg'$  weekly from 11/13/2020-12/11/2020 --Hgb 13.3. Patient does not have any signs/symptoms or iron deficiency anemia.  --due to gastric bipass may require future IV iron treatments. Would not recommend PO iron supplementation as it would likely not absorb well.  --recommend RTC in 12 months time to assure her iron stores remain replete.   No orders of the defined types were placed in this encounter.   All questions were answered. The patient knows to call the clinic with any problems, questions or concerns.  A total of more than 25 minutes were spent on this encounter and over half of that time was spent on counseling and coordination of care as outlined above.   Ledell Peoples, MD Department of Hematology/Oncology [place] [facility] Phone: [phone] Pager: [phone] Email: [email or web address]  08/23/2022 1:13 PM

## 2022-09-01 LAB — METHYLMALONIC ACID, SERUM: Methylmalonic Acid, Quantitative: 108 nmol/L (ref 0–378)

## 2022-09-23 ENCOUNTER — Other Ambulatory Visit: Payer: Self-pay | Admitting: Family Medicine

## 2022-10-03 ENCOUNTER — Ambulatory Visit: Payer: 59 | Admitting: Family Medicine

## 2022-10-26 LAB — HM MAMMOGRAPHY

## 2022-10-26 LAB — HM PAP SMEAR: HM Pap smear: NEGATIVE

## 2022-11-02 ENCOUNTER — Encounter: Payer: Self-pay | Admitting: *Deleted

## 2022-11-08 ENCOUNTER — Encounter: Payer: Self-pay | Admitting: Family Medicine

## 2022-11-08 ENCOUNTER — Ambulatory Visit: Payer: 59 | Admitting: Family Medicine

## 2022-11-08 VITALS — BP 107/71 | HR 55 | Ht 64.0 in | Wt 211.0 lb

## 2022-11-08 DIAGNOSIS — Z1159 Encounter for screening for other viral diseases: Secondary | ICD-10-CM | POA: Diagnosis not present

## 2022-11-08 DIAGNOSIS — Z114 Encounter for screening for human immunodeficiency virus [HIV]: Secondary | ICD-10-CM

## 2022-11-08 DIAGNOSIS — M069 Rheumatoid arthritis, unspecified: Secondary | ICD-10-CM

## 2022-11-08 DIAGNOSIS — E038 Other specified hypothyroidism: Secondary | ICD-10-CM

## 2022-11-08 DIAGNOSIS — E039 Hypothyroidism, unspecified: Secondary | ICD-10-CM

## 2022-11-08 MED ORDER — LEVOTHYROXINE SODIUM 100 MCG PO TABS: 100.0000 ug | ORAL_TABLET | Freq: Every day | ORAL | 3 refills | Status: DC

## 2022-11-08 NOTE — Progress Notes (Signed)
Hello doing Thanks LB a few centimeters  Subjective:    Patient ID: [name], female    DOB: [birth year], 62 y.o.   MRN: [id number]  HPI  3 month follow up no concerns voiced at this time Patient has rheumatoid disease follows with specialist On every 63-monthbasis with them takes Humira as well as methotrexate Takes thyroid medicine and states it is helping her.  Denies setbacks.  Continue current measures Patient with moderate obesity she is trying to watch her portions She does drink several beers on the weekend. 1-2 every day Review of Systems     Objective:   Physical Exam General-in no acute distress Eyes-no discharge Lungs-respiratory rate normal, CTA CV-no murmurs,RRR Extremities skin warm dry no edema Neuro grossly normal Behavior normal, alert   Labs patient to do these in March for her thyroid     Assessment & Plan:  Patient going to work on cardio as well as more strength training Alcohol use-patient having 2 beers or 3 every day and more on the weekends we counseled her regarding cutting back on this to help reduce the risk of alcohol related health issues Patient to keep alcohol intake to 2 drinks or less per day/beer  Hypothyroidism continue current measures check lab work in the spring Patient with morbid obesity portion control vegetables fruits lean meats physical activity Follow-up by fall time  Rheumatoid arthritis through her specialist Morbid obesity portion control regular physical activity try to bring weight down Up-to-date on preventative health Flu shot recommended

## 2022-11-16 ENCOUNTER — Encounter: Payer: Self-pay | Admitting: *Deleted

## 2022-12-15 ENCOUNTER — Encounter: Payer: Self-pay | Admitting: Radiology

## 2023-01-31 LAB — AMB RESULTS CONSOLE CBG: Glucose: 101

## 2023-01-31 NOTE — Progress Notes (Signed)
No interventions noted Patient refused SDOH

## 2023-02-02 ENCOUNTER — Encounter: Payer: Self-pay | Admitting: Podiatry

## 2023-02-02 ENCOUNTER — Ambulatory Visit (INDEPENDENT_AMBULATORY_CARE_PROVIDER_SITE_OTHER): Payer: 59 | Admitting: Podiatry

## 2023-02-02 DIAGNOSIS — Q828 Other specified congenital malformations of skin: Secondary | ICD-10-CM | POA: Diagnosis not present

## 2023-02-02 NOTE — Progress Notes (Signed)
Subjective:  Patient ID: [name], female    DOB: May 03, 1961,  MRN: [id number]  Chief Complaint  Patient presents with   Callouses    62 y.o. female presents with the above complaint.  Patient presents with left submetatarsal 5 pain.  Patient states pain for touch is progressive getting worse worse with ambulation worse with pressure she states that has been going on for quite some time she was last seen in 2018.  She has not seen MRIs prior to seeing me.  She is not a diabetic.  She does not wear any kind of orthotics.   Review of Systems: Negative except as noted in the HPI. Denies N/V/F/Ch.  Past Medical History:  Diagnosis Date   Abdominal pain of unknown etiology    Anal fistula    hx of    Aortic atherosclerosis    Constipation    severe, history of   DDD (degenerative disc disease), lumbar    Depression    Diarrhea    history of   GERD (gastroesophageal reflux disease)    History of hiatal hernia    Hypothyroidism    Iron deficiency anemia    Left shoulder pain    Low back pain    Lumbar herniated disc    Neck pain    Numbness of right lower extremity    Olecranon bursitis    Plantar fasciitis, left    Prediabetes    RA (rheumatoid arthritis)    Right carpal tunnel syndrome 01/09/2018   S/P colonoscopy April 2011   Dr. Juanda Chance: mild diverticulosis, hyperplastic polyps, repeat in 10 years   S/P endoscopy April 2011   Dr. Juanda Chance: no hiatal hernia, generous opening to antrum., patent gastrojejunostomy   Sciatic leg pain    Right   Ulnar neuropathy at elbow, right 01/09/2018   Vitamin D deficiency    Weakness of right lower extremity     Current Outpatient Medications:    alclomethasone (ACLOVATE) 0.05 % cream, APPLY TO THE AFFECTED AREAS OF THE FACE TWICE DAILY, Disp: , Rfl:    Calcium Carb-Cholecalciferol (OYSTER SHELL CALCIUM W/D) 500-5 MG-MCG TABS, Take by mouth., Disp: , Rfl:    Calcium Carbonate-Vitamin D 600-5 MG-MCG CAPS, Take 1 tablet by mouth  daily., Disp: , Rfl:    clobetasol cream (TEMOVATE) 0.05 %, , Disp: , Rfl:    ibuprofen (ADVIL) 600 MG tablet, Take by mouth., Disp: , Rfl:    lidocaine (XYLOCAINE) 1 % (with preservative) injection, by Infiltration route., Disp: , Rfl:    magnesium oxide (MAG-OX) 400 MG tablet, Take by mouth., Disp: , Rfl:    Sodium Caprylate POWD, Take by mouth., Disp: , Rfl:    tacrolimus (PROTOPIC) 0.1 % ointment, Apply topically., Disp: , Rfl:    Calcium Carbonate-Vitamin D (CALCIUM 600/VITAMIN D PO), Take 1 tablet by mouth daily., Disp: , Rfl:    Cholecalciferol (VITAMIN D3) 50 MCG (2000 UT) capsule, Take 2,000 Units by mouth daily., Disp: , Rfl:    DHEA 50 MG CAPS, Take 50 mg by mouth daily., Disp: , Rfl:    folic acid (FOLVITE) 1 MG tablet, Take 1 mg by mouth daily., Disp: , Rfl: 0   HUMIRA PEN 40 MG/0.4ML PNKT, SMARTSIG:40 Milligram(s) SUB-Q Every 2 Weeks, Disp: , Rfl:    Lactobacillus (PROBIOTIC ACIDOPHILUS PO), Take by mouth., Disp: , Rfl:    levothyroxine (SYNTHROID) 100 MCG tablet, Take 1 tablet (100 mcg total) by mouth daily., Disp: 90 tablet, Rfl: 3  methotrexate (RHEUMATREX) 2.5 MG tablet, Take 3 tablets (7.5 mg total) by mouth See admin instructions. Take 7.5 mg by mouth on Monday and Tuesday, Disp: 4 tablet, Rfl: 0   triamcinolone cream (KENALOG) 0.1 %, Apply 1 Application topically 2 (two) times daily. (Patient not taking: Reported on 11/08/2022), Disp: 30 g, Rfl: 0   vitamin B-12 (CYANOCOBALAMIN) 1000 MCG tablet, Take 1,000 mcg by mouth daily., Disp: , Rfl:   Social History   Tobacco Use  Smoking Status Former  Smokeless Tobacco Never  Tobacco Comments   quit 2006    Allergies  Allergen Reactions   Penicillins Itching   Objective:  There were no vitals filed for this visit. There is no height or weight on file to calculate BMI. Constitutional Well developed. Well nourished.  Vascular Dorsalis pedis pulses palpable bilaterally. Posterior tibial pulses palpable  bilaterally. Capillary refill normal to all digits.  No cyanosis or clubbing noted. Pedal hair growth normal.  Neurologic Normal speech. Oriented to person, place, and time. Epicritic sensation to light touch grossly present bilaterally.  Dermatologic Left submetatarsal 5 porokeratotic lesion painful to touch.  Central nucleated core noted.  Plantarflexed fifth metatarsal noted.  Orthopedic: Normal joint ROM without pain or crepitus bilaterally. No visible deformities. No bony tenderness.   Radiographs: None Assessment:   1. Porokeratosis    Plan:  Patient was evaluated and treated and all questions answered.  Left submetatarsal 5 porokeratosis -All questions or concerns were discussed with the patient extensive given the amount of pain that she is experiencing she will benefit from debridement of the lesion.  As a courtesy using chisel blade to handle the lesion was debrided down to healthy scar tissue.  No complication noted no pinpoint bleeding noted -  No follow-ups on file.

## 2023-02-16 LAB — HIV ANTIBODY (ROUTINE TESTING W REFLEX): HIV Screen 4th Generation wRfx: NONREACTIVE

## 2023-02-16 LAB — TSH+FREE T4
Free T4: 1.54 ng/dL (ref 0.82–1.77)
TSH: 4.72 u[IU]/mL — ABNORMAL HIGH (ref 0.450–4.500)

## 2023-02-16 LAB — HEPATITIS C ANTIBODY: Hep C Virus Ab: NONREACTIVE

## 2023-02-22 ENCOUNTER — Encounter: Payer: Self-pay | Admitting: *Deleted

## 2023-02-22 NOTE — Progress Notes (Signed)
Pt. Screened at [facility] for Health Equity. BP 108/72/Blood sugar 101/ No SDOH insecurities noted at event. Per chart review and Health Screening form, PCP is Dr. Jerrye Noble office visit 10/2022 and future appt scheduled for 07/10/23. No additional Health EquityTeam support indicated at this time. Perley Jain, CCG

## 2023-03-01 ENCOUNTER — Encounter (HOSPITAL_COMMUNITY): Payer: Self-pay | Admitting: Occupational Therapy

## 2023-03-01 ENCOUNTER — Ambulatory Visit (HOSPITAL_COMMUNITY): Payer: 59 | Attending: Orthopedic Surgery | Admitting: Occupational Therapy

## 2023-03-01 DIAGNOSIS — R29898 Other symptoms and signs involving the musculoskeletal system: Secondary | ICD-10-CM | POA: Diagnosis present

## 2023-03-01 DIAGNOSIS — M25512 Pain in left shoulder: Secondary | ICD-10-CM | POA: Diagnosis present

## 2023-03-01 NOTE — Patient Instructions (Signed)
  1) Flexion Wall Stretch    Face wall, place affected handon wall in front of you. Slide hand up the wall  and lean body in towards the wall. Hold for 10 seconds. Repeat 3-5 times. 1-2 times/day.     2) Towel Stretch with Internal Rotation      Gently pull to the side your affected arm  behind your back with the assist of a towel. Hold 10 seconds, repeat 3-5 times. 1-2 times/day.             3) Corner Stretch    Stand at a corner of a wall, place your arms on the walls with elbows bent. Lean into the corner until a stretch is felt along the front of your chest and/or shoulders. Hold for 10 seconds. Repeat 3-5X, 1-2 times/day.    4) Posterior Capsule Stretch    Bring the involved arm across chest. Grasp elbow and pull toward chest until you feel a stretch in the back of the upper arm and shoulder. Hold 10 seconds. Repeat 3-5X. Complete 1-2 times/day.      5) External Rotation Stretch:     Place your affected hand on the wall with the elbow bent and gently turn your body the opposite direction until a stretch is felt. Hold 10 seconds, repeat 3-5X. Complete 1-2 times/day.

## 2023-03-01 NOTE — Therapy (Signed)
OUTPATIENT OCCUPATIONAL THERAPY ORTHO EVALUATION  Patient Name: [name] MRN: [id number] DOB:Dec 08, 1960, 62 y.o., female Today's Date: 03/01/2023  PCP: Lilyan Punt, MD REFERRING PROVIDER: Dr. Margarita Rana  END OF SESSION:  OT End of Session - 03/01/23 1526     Visit Number 1    Number of Visits 8    Date for OT Re-Evaluation 03/31/23    Authorization Type [employer], $30 copay    OT Start Time 1346    OT Stop Time 1421    OT Time Calculation (min) 35 min    Activity Tolerance Patient tolerated treatment well    Behavior During Therapy WFL for tasks assessed/performed             Past Medical History:  Diagnosis Date   Abdominal pain of unknown etiology    Anal fistula    hx of    Aortic atherosclerosis (HCC)    Constipation    severe, history of   DDD (degenerative disc disease), lumbar    Depression    Diarrhea    history of   GERD (gastroesophageal reflux disease)    History of hiatal hernia    Hypothyroidism    Iron deficiency anemia    Left shoulder pain    Low back pain    Lumbar herniated disc    Neck pain    Numbness of right lower extremity    Olecranon bursitis    Plantar fasciitis, left    Prediabetes    RA (rheumatoid arthritis) (HCC)    Right carpal tunnel syndrome 01/09/2018   S/P colonoscopy April 2011   Dr. Juanda Chance: mild diverticulosis, hyperplastic polyps, repeat in 10 years   S/P endoscopy April 2011   Dr. Juanda Chance: no hiatal hernia, generous opening to antrum., patent gastrojejunostomy   Sciatic leg pain    Right   Ulnar neuropathy at elbow, right 01/09/2018   Vitamin D deficiency    Weakness of right lower extremity    Past Surgical History:  Procedure Laterality Date   anal fistulostomy     w/marsupialization an excision of anal tags   BACK SURGERY  10/2017   BREAST LUMPECTOMY Right    benign   CARPAL TUNNEL WITH CUBITAL TUNNEL Right 02/06/2018   Procedure: CARPAL TUNNEL RELEASE RIGHT TENOSYNOVECTOMY;  Surgeon: Cindee Salt, MD;   Location: [facility];  Service: Orthopedics;  Laterality: Right;  regional block   COLONOSCOPY     ENDOMETRIAL ABLATION     ESOPHAGOGASTRODUODENOSCOPY     GASTRIC BYPASS     mini gastric bypass in [place] reportedly   ROTATOR CUFF REPAIR  07/2017   ULNAR NERVE TRANSPOSITION Right 02/06/2018   Procedure: ULNAR NERVE DECOMPRESSION;  Surgeon: Cindee Salt, MD;  Location: [facility] [place];  Service: Orthopedics;  Laterality: Right;  regional block    ULNAR TUNNEL RELEASE Right 02/06/2018   Procedure: CUBITAL TUNNEL RELEASE;  Surgeon: Cindee Salt, MD;  Location: [facility] [place];  Service: Orthopedics;  Laterality: Right;  regoinal block   Patient Active Problem List   Diagnosis Date Noted   Chronic low back pain 07/11/2022   Degeneration of lumbosacral intervertebral disc 07/11/2022   Dysuria 07/11/2022   Obesity 07/11/2022   Piriformis syndrome 07/11/2022   Lumbar radiculopathy 03/17/2020   Candidiasis of skin 01/31/2020   Increased body mass index 09/10/2019   Closed fracture of right distal radius 05/07/2019   Cough 08/25/2018   Entrapment of right ulnar nerve 01/17/2018   Right carpal  tunnel syndrome 01/09/2018   Ulnar neuropathy at elbow, right 01/09/2018   HNP (herniated nucleus pulposus), lumbar 11/01/2017   Elevated transaminase level 09/21/2015   Vitamin D deficiency 09/21/2015   History of gastric bypass 09/21/2015   Anemia, iron deficiency 09/21/2015   Hypothyroidism 05/20/2014   Rheumatoid arthritis (HCC) 07/11/2013   Constipation 01/19/2011   Abdominal discomfort in left lower quadrant 01/19/2011   GERD 01/01/2010   HIATAL HERNIA 01/01/2010   ANAL FISTULA 01/01/2010   ABDOMINAL PAIN, UNSPECIFIED SITE 01/01/2010    ONSET DATE: ~3 months  REFERRING DIAG: left shoulder tendonitis  THERAPY DIAG:  Acute pain of left shoulder  Other symptoms and signs involving the musculoskeletal system  Rationale for Evaluation and Treatment:  Rehabilitation  SUBJECTIVE:   SUBJECTIVE STATEMENT: S: I washed windows 2 days ago and thought I was going to have to go to the ER. Pt accompanied by: self  PERTINENT HISTORY: Pt is a 62 y/o female presenting with left shoulder pain present for approximately 3 months. Pt reports it is worse when she is using it a lot. Pt with PMH of left RCR in 07/2017.   PRECAUTIONS: None  WEIGHT BEARING RESTRICTIONS: No  PAIN:  Are you having pain? Yes: NPRS scale: 4/10 Pain location: left shoulder Pain description: sore Aggravating factors: increased use Relieving factors: rest, heat  FALLS: Has patient fallen in last 6 months? No  PLOF: Independent  PATIENT GOALS: To have less pain in the left arm.   NEXT MD VISIT: 6 weeks  OBJECTIVE:   HAND DOMINANCE: Right  ADLs: Overall ADLs: Pt with difficulty with household chores, reaching behind back, reaching up and overhead. Pt with limited ability to lift items. Pt with difficulty sleeping, is unable to sleep on the left side.    FUNCTIONAL OUTCOME MEASURES: FOTO: 52/100  UPPER EXTREMITY ROM:     Assessed seated, er/IR adducted  Active ROM Left eval  Shoulder flexion 139  Shoulder abduction 136  Shoulder internal rotation 90  Shoulder external rotation 61  (Blank rows = not tested)    Assessed supine, er/IR adducted    Passive ROM Left eval  Shoulder flexion 146  Shoulder abduction 170  Shoulder internal rotation 90  Shoulder external rotation 80  (Blank rows = not tested)  UPPER EXTREMITY MMT:     Assessed seated, er/IR adducted  MMT Left eval  Shoulder flexion 4/5  Shoulder abduction 4/5  Shoulder internal rotation 5/5  Shoulder external rotation 4/5  (Blank rows = not tested)  SENSATION: None  EDEMA: None  COGNITION: Overall cognitive status: Within functional limits for tasks assessed  OBSERVATIONS: moderate fascial restrictions in left upper arm   TODAY'S TREATMENT:                                                                                                                               DATE: N/A-eval only     PATIENT EDUCATION: Education details: shoulder stretches  Person educated: Patient Education method: Explanation, Demonstration, and Handouts Education comprehension: verbalized understanding and returned demonstration  HOME EXERCISE PROGRAM: Eval: shoulder stretches  GOALS: Goals reviewed with patient? Yes  SHORT TERM GOALS: Target date: 03/31/23  Pt will be provided with and educated on HEP to improve mobility of LUE required for use during ADLs.   Goal status: INITIAL  2.  Pt will decrease pain in LUE to 3/10 or less to improve ability to sleep for 3+ consecutive hours without waking due to pain.   Goal status: INITIAL  3.  Pt will increase A/ROM of LUE by 20+ degrees to improve ability to perform reaching tasks required for washing back and hair.    Goal status: INITIAL  4.  Pt will decrease LUE fascial restrictions to minimal amounts or less to improve mobility required for functional reaching such as reaching overhead or behind back during ADLs.   Goal status: INITIAL  5.  Pt will increase LUE strength to 4+/5 or greater to improve ability to lift weighted items such as pots and pans and completing household or outdoor tasks.   Goal status: INITIAL    ASSESSMENT:  CLINICAL IMPRESSION: Patient is a 62 y.o. female who was seen today for occupational therapy evaluation for left shoulder pain. Pt has PMH of left RCR in 2018, reports current pain began approximately 3 months ago. Pt presents with increased pain and fascial restrictions, decreased ROM, strength, and functional use of LUE required for ADL completion.    PERFORMANCE DEFICITS: in functional skills including ADLs, IADLs, coordination, tone, ROM, strength, pain, fascial restrictions, muscle spasms, and UE functional use  IMPAIRMENTS: are limiting patient from ADLs, IADLs, rest and sleep, work,  and leisure.   COMORBIDITIES: has no other co-morbidities that affects occupational performance. Patient will benefit from skilled OT to address above impairments and improve overall function.  MODIFICATION OR ASSISTANCE TO COMPLETE EVALUATION: No modification of tasks or assist necessary to complete an evaluation.  OT OCCUPATIONAL PROFILE AND HISTORY: Problem focused assessment: Including review of records relating to presenting problem.  CLINICAL DECISION MAKING: LOW - limited treatment options, no task modification necessary  REHAB POTENTIAL: Good  EVALUATION COMPLEXITY: Low      PLAN:  OT FREQUENCY: 2x/week  OT DURATION: 4 weeks  PLANNED INTERVENTIONS: self care/ADL training, therapeutic exercise, therapeutic activity, neuromuscular re-education, manual therapy, passive range of motion, splinting, electrical stimulation, ultrasound, moist heat, cryotherapy, patient/family education, and DME and/or AE instructions  RECOMMENDED OTHER SERVICES: None  CONSULTED AND AGREED WITH PLAN OF CARE: Patient  PLAN FOR NEXT SESSION: Follow up on HEP, complete manual techniques, A/ROM, scapular strengthening, shoulder stretches   Ezra Sites, OTR/L  [phone] 03/01/2023, 3:26 PM

## 2023-03-03 ENCOUNTER — Ambulatory Visit (HOSPITAL_COMMUNITY): Payer: 59 | Admitting: Occupational Therapy

## 2023-03-03 ENCOUNTER — Encounter (HOSPITAL_COMMUNITY): Payer: Self-pay | Admitting: Occupational Therapy

## 2023-03-03 DIAGNOSIS — M25512 Pain in left shoulder: Secondary | ICD-10-CM | POA: Diagnosis not present

## 2023-03-03 DIAGNOSIS — R29898 Other symptoms and signs involving the musculoskeletal system: Secondary | ICD-10-CM

## 2023-03-03 NOTE — Patient Instructions (Signed)

## 2023-03-03 NOTE — Therapy (Signed)
OUTPATIENT OCCUPATIONAL THERAPY ORTHO TREATMENT  Patient Name: [name] MRN: [id number] DOB:03-23-1961, 62 y.o., female Today's Date: 03/03/2023  PCP: Lilyan Punt, MD REFERRING PROVIDER: Dr. Margarita Rana  END OF SESSION:  OT End of Session - 03/03/23 1515     Visit Number 2    Number of Visits 8    Date for OT Re-Evaluation 03/31/23    Authorization Type [employer], $30 copay    Authorization - Visit Number 1    OT Start Time 1430    OT Stop Time 1510    OT Time Calculation (min) 40 min    Activity Tolerance Patient tolerated treatment well    Behavior During Therapy WFL for tasks assessed/performed              Past Medical History:  Diagnosis Date   Abdominal pain of unknown etiology    Anal fistula    hx of    Aortic atherosclerosis (HCC)    Constipation    severe, history of   DDD (degenerative disc disease), lumbar    Depression    Diarrhea    history of   GERD (gastroesophageal reflux disease)    History of hiatal hernia    Hypothyroidism    Iron deficiency anemia    Left shoulder pain    Low back pain    Lumbar herniated disc    Neck pain    Numbness of right lower extremity    Olecranon bursitis    Plantar fasciitis, left    Prediabetes    RA (rheumatoid arthritis) (HCC)    Right carpal tunnel syndrome 01/09/2018   S/P colonoscopy April 2011   Dr. Juanda Chance: mild diverticulosis, hyperplastic polyps, repeat in 10 years   S/P endoscopy April 2011   Dr. Juanda Chance: no hiatal hernia, generous opening to antrum., patent gastrojejunostomy   Sciatic leg pain    Right   Ulnar neuropathy at elbow, right 01/09/2018   Vitamin D deficiency    Weakness of right lower extremity    Past Surgical History:  Procedure Laterality Date   anal fistulostomy     w/marsupialization an excision of anal tags   BACK SURGERY  10/2017   BREAST LUMPECTOMY Right    benign   CARPAL TUNNEL WITH CUBITAL TUNNEL Right 02/06/2018   Procedure: CARPAL TUNNEL RELEASE RIGHT  TENOSYNOVECTOMY;  Surgeon: Cindee Salt, MD;  Location: [facility] [place];  Service: Orthopedics;  Laterality: Right;  regional block   COLONOSCOPY     ENDOMETRIAL ABLATION     ESOPHAGOGASTRODUODENOSCOPY     GASTRIC BYPASS     mini gastric bypass in [place] reportedly   ROTATOR CUFF REPAIR  07/2017   ULNAR NERVE TRANSPOSITION Right 02/06/2018   Procedure: ULNAR NERVE DECOMPRESSION;  Surgeon: Cindee Salt, MD;  Location: [facility] [place];  Service: Orthopedics;  Laterality: Right;  regional block    ULNAR TUNNEL RELEASE Right 02/06/2018   Procedure: CUBITAL TUNNEL RELEASE;  Surgeon: Cindee Salt, MD;  Location: [facility] [place];  Service: Orthopedics;  Laterality: Right;  regoinal block   Patient Active Problem List   Diagnosis Date Noted   Chronic low back pain 07/11/2022   Degeneration of lumbosacral intervertebral disc 07/11/2022   Dysuria 07/11/2022   Obesity 07/11/2022   Piriformis syndrome 07/11/2022   Lumbar radiculopathy 03/17/2020   Candidiasis of skin 01/31/2020   Increased body mass index 09/10/2019   Closed fracture of right distal radius 05/07/2019   Cough 08/25/2018   Entrapment  of right ulnar nerve 01/17/2018   Right carpal tunnel syndrome 01/09/2018   Ulnar neuropathy at elbow, right 01/09/2018   HNP (herniated nucleus pulposus), lumbar 11/01/2017   Elevated transaminase level 09/21/2015   Vitamin D deficiency 09/21/2015   History of gastric bypass 09/21/2015   Anemia, iron deficiency 09/21/2015   Hypothyroidism 05/20/2014   Rheumatoid arthritis (HCC) 07/11/2013   Constipation 01/19/2011   Abdominal discomfort in left lower quadrant 01/19/2011   GERD 01/01/2010   HIATAL HERNIA 01/01/2010   ANAL FISTULA 01/01/2010   ABDOMINAL PAIN, UNSPECIFIED SITE 01/01/2010    ONSET DATE: ~3 months  REFERRING DIAG: left shoulder tendonitis  THERAPY DIAG:  Acute pain of left shoulder  Other symptoms and signs involving the musculoskeletal  system  Rationale for Evaluation and Treatment: Rehabilitation  SUBJECTIVE:   SUBJECTIVE STATEMENT: S: "I have been feeling a lot better". Pt accompanied by: self  PERTINENT HISTORY: Pt is a 63 y/o female presenting with left shoulder pain present for approximately 3 months. Pt reports it is worse when she is using it a lot. Pt with PMH of left RCR in 07/2017.   PRECAUTIONS: None  WEIGHT BEARING RESTRICTIONS: No  PAIN:  Are you having pain? Yes: NPRS scale: 4/10 Pain location: left shoulder Pain description: sore Aggravating factors: increased use Relieving factors: rest, heat  FALLS: Has patient fallen in last 6 months? No  PLOF: Independent  PATIENT GOALS: To have less pain in the left arm.   NEXT MD VISIT: 6 weeks  OBJECTIVE:   HAND DOMINANCE: Right  ADLs: Overall ADLs: Pt with difficulty with household chores, reaching behind back, reaching up and overhead. Pt with limited ability to lift items. Pt with difficulty sleeping, is unable to sleep on the left side.    FUNCTIONAL OUTCOME MEASURES: FOTO: 52/100  UPPER EXTREMITY ROM:     Assessed seated, er/IR adducted  Active ROM Left eval  Shoulder flexion 139  Shoulder abduction 136  Shoulder internal rotation 90  Shoulder external rotation 61  (Blank rows = not tested)    Assessed supine, er/IR adducted    Passive ROM Left eval  Shoulder flexion 146  Shoulder abduction 170  Shoulder internal rotation 90  Shoulder external rotation 80  (Blank rows = not tested)  UPPER EXTREMITY MMT:     Assessed seated, er/IR adducted  MMT Left eval  Shoulder flexion 4/5  Shoulder abduction 4/5  Shoulder internal rotation 5/5  Shoulder external rotation 4/5  (Blank rows = not tested)  SENSATION: None  EDEMA: None  COGNITION: Overall cognitive status: Within functional limits for tasks assessed  OBSERVATIONS: moderate fascial restrictions in left upper arm   TODAY'S TREATMENT:                                                                                                                               DATE: N/A-eval only    03/03/23 -Manual techniques to upper arm, axially region, trapezius -  P/ROM: shoulder flexion, abduction, external/internal rotation 10x each way -AA/ROM: shoulder flexion, abduction, external/internal rotation 10x each way   PATIENT EDUCATION: Education details: shoulder stretches Person educated: Patient Education method: Explanation, Demonstration, and Handouts Education comprehension: verbalized understanding and returned demonstration  HOME EXERCISE PROGRAM: Eval: shoulder stretches  GOALS: Goals reviewed with patient? Yes  SHORT TERM GOALS: Target date: 03/31/23  Pt will be provided with and educated on HEP to improve mobility of LUE required for use during ADLs.   Goal status: INITIAL  2.  Pt will decrease pain in LUE to 3/10 or less to improve ability to sleep for 3+ consecutive hours without waking due to pain.   Goal status: INITIAL  3.  Pt will increase A/ROM of LUE by 20+ degrees to improve ability to perform reaching tasks required for washing back and hair.    Goal status: INITIAL  4.  Pt will decrease LUE fascial restrictions to minimal amounts or less to improve mobility required for functional reaching such as reaching overhead or behind back during ADLs.   Goal status: INITIAL  5.  Pt will increase LUE strength to 4+/5 or greater to improve ability to lift weighted items such as pots and pans and completing household or outdoor tasks.   Goal status: INITIAL    ASSESSMENT:  CLINICAL IMPRESSION: Pt reports that she has been completing shoulder stretches that were added to HEP at initial evaluation. Began session with manual techniques to decrease fascial restrictions and increase ROM. Pt tolerated full ROM during P/ROM exercises. Added AA/ROM today and incorporated into HEP.   PERFORMANCE DEFICITS: in functional skills including  ADLs, IADLs, coordination, tone, ROM, strength, pain, fascial restrictions, muscle spasms, and UE functional use  IMPAIRMENTS: are limiting patient from ADLs, IADLs, rest and sleep, work, and leisure.   COMORBIDITIES: has no other co-morbidities that affects occupational performance. Patient will benefit from skilled OT to address above impairments and improve overall function.  MODIFICATION OR ASSISTANCE TO COMPLETE EVALUATION: No modification of tasks or assist necessary to complete an evaluation.  OT OCCUPATIONAL PROFILE AND HISTORY: Problem focused assessment: Including review of records relating to presenting problem.  CLINICAL DECISION MAKING: LOW - limited treatment options, no task modification necessary  REHAB POTENTIAL: Good  EVALUATION COMPLEXITY: Low      PLAN:  OT FREQUENCY: 2x/week  OT DURATION: 4 weeks  PLANNED INTERVENTIONS: self care/ADL training, therapeutic exercise, therapeutic activity, neuromuscular re-education, manual therapy, passive range of motion, splinting, electrical stimulation, ultrasound, moist heat, cryotherapy, patient/family education, and DME and/or AE instructions  RECOMMENDED OTHER SERVICES: None  CONSULTED AND AGREED WITH PLAN OF CARE: Patient  PLAN FOR NEXT SESSION: Follow up on HEP, complete manual techniques, A/ROM, scapular strengthening, shoulder stretches  Lurena Joiner OTR/L  [phone] 03/03/2023, 3:20 PM

## 2023-03-06 ENCOUNTER — Encounter (HOSPITAL_COMMUNITY): Payer: 59 | Admitting: Occupational Therapy

## 2023-03-08 ENCOUNTER — Ambulatory Visit (HOSPITAL_COMMUNITY): Payer: 59 | Admitting: Occupational Therapy

## 2023-03-08 ENCOUNTER — Encounter (HOSPITAL_COMMUNITY): Payer: Self-pay | Admitting: Occupational Therapy

## 2023-03-08 DIAGNOSIS — M25512 Pain in left shoulder: Secondary | ICD-10-CM | POA: Diagnosis not present

## 2023-03-08 DIAGNOSIS — R29898 Other symptoms and signs involving the musculoskeletal system: Secondary | ICD-10-CM

## 2023-03-08 NOTE — Therapy (Signed)
OUTPATIENT OCCUPATIONAL THERAPY ORTHO TREATMENT  Patient Name: [name] MRN: [id number] DOB:Jun 19, 1961, 62 y.o., female Today's Date: 03/08/2023  PCP: Lilyan Punt, MD REFERRING PROVIDER: Dr. Margarita Rana  END OF SESSION:  OT End of Session - 03/08/23 1347     Visit Number 3    Number of Visits 8    Date for OT Re-Evaluation 03/31/23    Authorization Type [employer], $30 copay    OT Start Time 1347    OT Stop Time 1430    OT Time Calculation (min) 43 min    Activity Tolerance Patient tolerated treatment well    Behavior During Therapy WFL for tasks assessed/performed              Past Medical History:  Diagnosis Date   Abdominal pain of unknown etiology    Anal fistula    hx of    Aortic atherosclerosis (HCC)    Constipation    severe, history of   DDD (degenerative disc disease), lumbar    Depression    Diarrhea    history of   GERD (gastroesophageal reflux disease)    History of hiatal hernia    Hypothyroidism    Iron deficiency anemia    Left shoulder pain    Low back pain    Lumbar herniated disc    Neck pain    Numbness of right lower extremity    Olecranon bursitis    Plantar fasciitis, left    Prediabetes    RA (rheumatoid arthritis) (HCC)    Right carpal tunnel syndrome 01/09/2018   S/P colonoscopy April 2011   Dr. Juanda Chance: mild diverticulosis, hyperplastic polyps, repeat in 10 years   S/P endoscopy April 2011   Dr. Juanda Chance: no hiatal hernia, generous opening to antrum., patent gastrojejunostomy   Sciatic leg pain    Right   Ulnar neuropathy at elbow, right 01/09/2018   Vitamin D deficiency    Weakness of right lower extremity    Past Surgical History:  Procedure Laterality Date   anal fistulostomy     w/marsupialization an excision of anal tags   BACK SURGERY  10/2017   BREAST LUMPECTOMY Right    benign   CARPAL TUNNEL WITH CUBITAL TUNNEL Right 02/06/2018   Procedure: CARPAL TUNNEL RELEASE RIGHT TENOSYNOVECTOMY;  Surgeon: Cindee Salt, MD;   Location: [facility] [place];  Service: Orthopedics;  Laterality: Right;  regional block   COLONOSCOPY     ENDOMETRIAL ABLATION     ESOPHAGOGASTRODUODENOSCOPY     GASTRIC BYPASS     mini gastric bypass in [place] reportedly   ROTATOR CUFF REPAIR  07/2017   ULNAR NERVE TRANSPOSITION Right 02/06/2018   Procedure: ULNAR NERVE DECOMPRESSION;  Surgeon: Cindee Salt, MD;  Location: [facility];  Service: Orthopedics;  Laterality: Right;  regional block    ULNAR TUNNEL RELEASE Right 02/06/2018   Procedure: CUBITAL TUNNEL RELEASE;  Surgeon: Cindee Salt, MD;  Location: [facility];  Service: Orthopedics;  Laterality: Right;  regoinal block   Patient Active Problem List   Diagnosis Date Noted   Chronic low back pain 07/11/2022   Degeneration of lumbosacral intervertebral disc 07/11/2022   Dysuria 07/11/2022   Obesity 07/11/2022   Piriformis syndrome 07/11/2022   Lumbar radiculopathy 03/17/2020   Candidiasis of skin 01/31/2020   Increased body mass index 09/10/2019   Closed fracture of right distal radius 05/07/2019   Cough 08/25/2018   Entrapment of right ulnar nerve 01/17/2018   Right  carpal tunnel syndrome 01/09/2018   Ulnar neuropathy at elbow, right 01/09/2018   HNP (herniated nucleus pulposus), lumbar 11/01/2017   Elevated transaminase level 09/21/2015   Vitamin D deficiency 09/21/2015   History of gastric bypass 09/21/2015   Anemia, iron deficiency 09/21/2015   Hypothyroidism 05/20/2014   Rheumatoid arthritis (HCC) 07/11/2013   Constipation 01/19/2011   Abdominal discomfort in left lower quadrant 01/19/2011   GERD 01/01/2010   HIATAL HERNIA 01/01/2010   ANAL FISTULA 01/01/2010   ABDOMINAL PAIN, UNSPECIFIED SITE 01/01/2010    ONSET DATE: ~3 months  REFERRING DIAG: left shoulder tendonitis  THERAPY DIAG:  Acute pain of left shoulder  Other symptoms and signs involving the musculoskeletal system  Rationale for Evaluation and  Treatment: Rehabilitation  SUBJECTIVE:   SUBJECTIVE STATEMENT: S: "It's popping and crunching all the time". Pt accompanied by: self  PERTINENT HISTORY: Pt is a 62 y/o female presenting with left shoulder pain present for approximately 3 months. Pt reports it is worse when she is using it a lot. Pt with PMH of left RCR in 07/2017.   PRECAUTIONS: None  WEIGHT BEARING RESTRICTIONS: No  PAIN:  Are you having pain? Yes: NPRS scale: 6/10 Pain location: left shoulder Pain description: aching Aggravating factors: increased use Relieving factors: rest, heat  FALLS: Has patient fallen in last 6 months? No  PLOF: Independent  PATIENT GOALS: To have less pain in the left arm.   NEXT MD VISIT: 6 weeks  OBJECTIVE:   HAND DOMINANCE: Right  ADLs: Overall ADLs: Pt with difficulty with household chores, reaching behind back, reaching up and overhead. Pt with limited ability to lift items. Pt with difficulty sleeping, is unable to sleep on the left side.    FUNCTIONAL OUTCOME MEASURES: FOTO: 52/100  UPPER EXTREMITY ROM:     Assessed seated, er/IR adducted  Active ROM Left eval  Shoulder flexion 139  Shoulder abduction 136  Shoulder internal rotation 90  Shoulder external rotation 61  (Blank rows = not tested)    Assessed supine, er/IR adducted    Passive ROM Left eval  Shoulder flexion 146  Shoulder abduction 170  Shoulder internal rotation 90  Shoulder external rotation 80  (Blank rows = not tested)  UPPER EXTREMITY MMT:     Assessed seated, er/IR adducted  MMT Left eval  Shoulder flexion 4/5  Shoulder abduction 4/5  Shoulder internal rotation 5/5  Shoulder external rotation 4/5  (Blank rows = not tested)  SENSATION: None  EDEMA: None  COGNITION: Overall cognitive status: Within functional limits for tasks assessed  OBSERVATIONS: moderate fascial restrictions in left upper arm   TODAY'S TREATMENT:                                                                                                                               DATE:    03/08/23 -Manual Therapy: myofascial release and trigger point applied to biceps, trapezius, and scapula area in order to reduce  pain and fascial restrictions and improve ROM.  -AA/ROM: supine, flexion, abduction, protraction, horizontal abduction, er/IR, x12 -Isometrics: flexion, extension, abduction, er, IR, 5x10" -A/ROM: seated, flexion, abduction, protraction, horizontal abduction, er/IR, x10  03/03/23 -Manual techniques to upper arm, axially region, trapezius -P/ROM: shoulder flexion, abduction, external/internal rotation 10x each way -AA/ROM: shoulder flexion, abduction, external/internal rotation 10x each way   PATIENT EDUCATION: Education details: Isometrics Person educated: Patient Education method: Programmer, multimedia, Demonstration, and Handouts Education comprehension: verbalized understanding and returned demonstration  HOME EXERCISE PROGRAM: Eval: shoulder stretches 5/17: AA/ROM 5/22: Isometrics  GOALS: Goals reviewed with patient? Yes  SHORT TERM GOALS: Target date: 03/31/23  Pt will be provided with and educated on HEP to improve mobility of LUE required for use during ADLs.   Goal status: IN PROGRESS  2.  Pt will decrease pain in LUE to 3/10 or less to improve ability to sleep for 3+ consecutive hours without waking due to pain.   Goal status: IN PROGRESS  3.  Pt will increase A/ROM of LUE by 20+ degrees to improve ability to perform reaching tasks required for washing back and hair.    Goal status: IN PROGRESS  4.  Pt will decrease LUE fascial restrictions to minimal amounts or less to improve mobility required for functional reaching such as reaching overhead or behind back during ADLs.   Goal status: IN PROGRESS  5.  Pt will increase LUE strength to 4+/5 or greater to improve ability to lift weighted items such as pots and pans and completing household or outdoor tasks.   Goal  status: IN PROGRESS    ASSESSMENT:  CLINICAL IMPRESSION: This session pt reports increased pain, however with AA/ROM and A/ROM she is able to achieve full ROM. OT added isometrics this session for strengthening and stabilization within the shoulder girdle, while maintaining minimal movement. With all exercises pt reporting mild increase in pain with abduction and external rotation. OT providing min to mod verbal and tactile cuing for positioning and technique.   PERFORMANCE DEFICITS: in functional skills including ADLs, IADLs, coordination, tone, ROM, strength, pain, fascial restrictions, muscle spasms, and UE functional use   PLAN:  OT FREQUENCY: 2x/week  OT DURATION: 4 weeks  PLANNED INTERVENTIONS: self care/ADL training, therapeutic exercise, therapeutic activity, neuromuscular re-education, manual therapy, passive range of motion, splinting, electrical stimulation, ultrasound, moist heat, cryotherapy, patient/family education, and DME and/or AE instructions  RECOMMENDED OTHER SERVICES: None  CONSULTED AND AGREED WITH PLAN OF CARE: Patient  PLAN FOR NEXT SESSION: Follow up on HEP, complete manual techniques, A/ROM, scapular strengthening, shoulder stretches  Trish Mage, OTR/L [phone] 03/08/2023, 1:48 PM

## 2023-03-08 NOTE — Patient Instructions (Signed)

## 2023-03-15 ENCOUNTER — Ambulatory Visit (HOSPITAL_COMMUNITY): Payer: 59 | Admitting: Occupational Therapy

## 2023-03-15 ENCOUNTER — Encounter (HOSPITAL_COMMUNITY): Payer: Self-pay | Admitting: Occupational Therapy

## 2023-03-15 DIAGNOSIS — M25512 Pain in left shoulder: Secondary | ICD-10-CM

## 2023-03-15 DIAGNOSIS — R29898 Other symptoms and signs involving the musculoskeletal system: Secondary | ICD-10-CM

## 2023-03-15 NOTE — Therapy (Signed)
OUTPATIENT OCCUPATIONAL THERAPY ORTHO TREATMENT  Patient Name: [name] MRN: [id number] DOB:05/03/1961, 62 y.o., female Today's Date: 03/15/2023  PCP: Lilyan Punt, MD REFERRING PROVIDER: Dr. Margarita Rana  END OF SESSION:  OT End of Session - 03/15/23 1456     Visit Number 4    Number of Visits 8    Date for OT Re-Evaluation 03/31/23    Authorization Type [employer], $30 copay    OT Start Time 1342    OT Stop Time 1429    OT Time Calculation (min) 47 min    Activity Tolerance Patient tolerated treatment well    Behavior During Therapy WFL for tasks assessed/performed             Past Medical History:  Diagnosis Date   Abdominal pain of unknown etiology    Anal fistula    hx of    Aortic atherosclerosis (HCC)    Constipation    severe, history of   DDD (degenerative disc disease), lumbar    Depression    Diarrhea    history of   GERD (gastroesophageal reflux disease)    History of hiatal hernia    Hypothyroidism    Iron deficiency anemia    Left shoulder pain    Low back pain    Lumbar herniated disc    Neck pain    Numbness of right lower extremity    Olecranon bursitis    Plantar fasciitis, left    Prediabetes    RA (rheumatoid arthritis) (HCC)    Right carpal tunnel syndrome 01/09/2018   S/P colonoscopy April 2011   Dr. Juanda Chance: mild diverticulosis, hyperplastic polyps, repeat in 10 years   S/P endoscopy April 2011   Dr. Juanda Chance: no hiatal hernia, generous opening to antrum., patent gastrojejunostomy   Sciatic leg pain    Right   Ulnar neuropathy at elbow, right 01/09/2018   Vitamin D deficiency    Weakness of right lower extremity    Past Surgical History:  Procedure Laterality Date   anal fistulostomy     w/marsupialization an excision of anal tags   BACK SURGERY  10/2017   BREAST LUMPECTOMY Right    benign   CARPAL TUNNEL WITH CUBITAL TUNNEL Right 02/06/2018   Procedure: CARPAL TUNNEL RELEASE RIGHT TENOSYNOVECTOMY;  Surgeon: Cindee Salt, MD;   Location: [facility] [place];  Service: Orthopedics;  Laterality: Right;  regional block   COLONOSCOPY     ENDOMETRIAL ABLATION     ESOPHAGOGASTRODUODENOSCOPY     GASTRIC BYPASS     mini gastric bypass in [place] reportedly   ROTATOR CUFF REPAIR  07/2017   ULNAR NERVE TRANSPOSITION Right 02/06/2018   Procedure: ULNAR NERVE DECOMPRESSION;  Surgeon: Cindee Salt, MD;  Location: [facility] [place];  Service: Orthopedics;  Laterality: Right;  regional block    ULNAR TUNNEL RELEASE Right 02/06/2018   Procedure: CUBITAL TUNNEL RELEASE;  Surgeon: Cindee Salt, MD;  Location: [facility] [place];  Service: Orthopedics;  Laterality: Right;  regoinal block   Patient Active Problem List   Diagnosis Date Noted   Chronic low back pain 07/11/2022   Degeneration of lumbosacral intervertebral disc 07/11/2022   Dysuria 07/11/2022   Obesity 07/11/2022   Piriformis syndrome 07/11/2022   Lumbar radiculopathy 03/17/2020   Candidiasis of skin 01/31/2020   Increased body mass index 09/10/2019   Closed fracture of right distal radius 05/07/2019   Cough 08/25/2018   Entrapment of right ulnar nerve 01/17/2018   Right carpal  tunnel syndrome 01/09/2018   Ulnar neuropathy at elbow, right 01/09/2018   HNP (herniated nucleus pulposus), lumbar 11/01/2017   Elevated transaminase level 09/21/2015   Vitamin D deficiency 09/21/2015   History of gastric bypass 09/21/2015   Anemia, iron deficiency 09/21/2015   Hypothyroidism 05/20/2014   Rheumatoid arthritis (HCC) 07/11/2013   Constipation 01/19/2011   Abdominal discomfort in left lower quadrant 01/19/2011   GERD 01/01/2010   HIATAL HERNIA 01/01/2010   ANAL FISTULA 01/01/2010   ABDOMINAL PAIN, UNSPECIFIED SITE 01/01/2010    ONSET DATE: ~3 months  REFERRING DIAG: left shoulder tendonitis  THERAPY DIAG:  Acute pain of left shoulder  Other symptoms and signs involving the musculoskeletal system  Rationale for Evaluation and Treatment:  Rehabilitation  SUBJECTIVE:   SUBJECTIVE STATEMENT: S: "I don't think therapy is helping". Pt accompanied by: self  PERTINENT HISTORY: Pt is a 62 y/o female presenting with left shoulder pain present for approximately 3 months. Pt reports it is worse when she is using it a lot. Pt with PMH of left RCR in 07/2017.   PRECAUTIONS: None  WEIGHT BEARING RESTRICTIONS: No  PAIN:  Are you having pain? Yes: NPRS scale: 5/10 Pain location: left shoulder Pain description: aching Aggravating factors: increased use Relieving factors: rest, heat  FALLS: Has patient fallen in last 6 months? No  PLOF: Independent  PATIENT GOALS: To have less pain in the left arm.   NEXT MD VISIT: 6 weeks  OBJECTIVE:   HAND DOMINANCE: Right  ADLs: Overall ADLs: Pt with difficulty with household chores, reaching behind back, reaching up and overhead. Pt with limited ability to lift items. Pt with difficulty sleeping, is unable to sleep on the left side.    FUNCTIONAL OUTCOME MEASURES: FOTO: 52/100  UPPER EXTREMITY ROM:     Assessed seated, er/IR adducted  Active ROM Left eval  Shoulder flexion 139  Shoulder abduction 136  Shoulder internal rotation 90  Shoulder external rotation 61  (Blank rows = not tested)    Assessed supine, er/IR adducted    Passive ROM Left eval  Shoulder flexion 146  Shoulder abduction 170  Shoulder internal rotation 90  Shoulder external rotation 80  (Blank rows = not tested)  UPPER EXTREMITY MMT:     Assessed seated, er/IR adducted  MMT Left eval  Shoulder flexion 4/5  Shoulder abduction 4/5  Shoulder internal rotation 5/5  Shoulder external rotation 4/5  (Blank rows = not tested)  SENSATION: None  EDEMA: None  COGNITION: Overall cognitive status: Within functional limits for tasks assessed  OBSERVATIONS: moderate fascial restrictions in left upper arm   TODAY'S TREATMENT:                                                                                                                               DATE:    03/15/23 -Manual Therapy: myofascial release and trigger point applied to biceps, trapezius, and scapula area in order to reduce pain and  fascial restrictions and improve ROM.  -A/ROM: seated, flexion, abduction, protraction, horizontal abduction, er/IR, x12 -Latissimus Stretch, 4x20" alternating BUE on top -Stretching: flexion, er, doorway stretch, er behind the back, IR over the head, 3x20" -Wall Slides: flexion and abduction, x10  03/08/23 -Manual Therapy: myofascial release and trigger point applied to biceps, trapezius, and scapula area in order to reduce pain and fascial restrictions and improve ROM.  -AA/ROM: supine, flexion, abduction, protraction, horizontal abduction, er/IR, x12 -Isometrics: flexion, extension, abduction, er, IR, 5x10" -A/ROM: seated, flexion, abduction, protraction, horizontal abduction, er/IR, x10  03/03/23 -Manual techniques to upper arm, axially region, trapezius -P/ROM: shoulder flexion, abduction, external/internal rotation 10x each way -AA/ROM: shoulder flexion, abduction, external/internal rotation 10x each way   PATIENT EDUCATION: Education details: Stretching (flexion, er, doorway stretch, er behind the back, IR over the head) Person educated: Patient Education method: Explanation, Demonstration, and Handouts Education comprehension: verbalized understanding and returned demonstration  HOME EXERCISE PROGRAM: Eval: shoulder stretches 5/17: AA/ROM 5/22: Isometrics 5/29: Stretches (flexion, er, doorway stretch, er behind the back, IR over the head)  GOALS: Goals reviewed with patient? Yes  SHORT TERM GOALS: Target date: 03/31/23  Pt will be provided with and educated on HEP to improve mobility of LUE required for use during ADLs.   Goal status: IN PROGRESS  2.  Pt will decrease pain in LUE to 3/10 or less to improve ability to sleep for 3+ consecutive hours without waking due to  pain.   Goal status: IN PROGRESS  3.  Pt will increase A/ROM of LUE by 20+ degrees to improve ability to perform reaching tasks required for washing back and hair.    Goal status: IN PROGRESS  4.  Pt will decrease LUE fascial restrictions to minimal amounts or less to improve mobility required for functional reaching such as reaching overhead or behind back during ADLs.   Goal status: IN PROGRESS  5.  Pt will increase LUE strength to 4+/5 or greater to improve ability to lift weighted items such as pots and pans and completing household or outdoor tasks.   Goal status: IN PROGRESS    ASSESSMENT:  CLINICAL IMPRESSION: This session pt continues to have increased pain and discomfort in her shoulder. Session focused on stretches and mobility, as pt's movement pattern is choppy and strained. She continues to report popping and clicking within joint during all exercises, as well as increased pain as her muscle fatigue. OT providing frequent verbal and visual cuing for postioning and technique with all exercises, as well as to reduce shoulder hiking.   PERFORMANCE DEFICITS: in functional skills including ADLs, IADLs, coordination, tone, ROM, strength, pain, fascial restrictions, muscle spasms, and UE functional use   PLAN:  OT FREQUENCY: 2x/week  OT DURATION: 4 weeks  PLANNED INTERVENTIONS: self care/ADL training, therapeutic exercise, therapeutic activity, neuromuscular re-education, manual therapy, passive range of motion, splinting, electrical stimulation, ultrasound, moist heat, cryotherapy, patient/family education, and DME and/or AE instructions  RECOMMENDED OTHER SERVICES: None  CONSULTED AND AGREED WITH PLAN OF CARE: Patient  PLAN FOR NEXT SESSION: Follow up on HEP, complete manual techniques, A/ROM, scapular strengthening, shoulder stretches  Trish Mage, OTR/L [phone] 03/15/2023, 2:59 PM

## 2023-03-20 ENCOUNTER — Encounter (HOSPITAL_COMMUNITY): Payer: 59 | Admitting: Occupational Therapy

## 2023-03-22 ENCOUNTER — Ambulatory Visit (HOSPITAL_COMMUNITY): Payer: 59 | Attending: Orthopedic Surgery | Admitting: Occupational Therapy

## 2023-03-22 ENCOUNTER — Encounter (HOSPITAL_COMMUNITY): Payer: Self-pay | Admitting: Occupational Therapy

## 2023-03-22 DIAGNOSIS — M25512 Pain in left shoulder: Secondary | ICD-10-CM | POA: Diagnosis present

## 2023-03-22 DIAGNOSIS — R29898 Other symptoms and signs involving the musculoskeletal system: Secondary | ICD-10-CM | POA: Insufficient documentation

## 2023-03-22 NOTE — Therapy (Signed)
OUTPATIENT OCCUPATIONAL THERAPY ORTHO TREATMENT  Patient Name: [name] MRN: [id number] DOB:1960/12/09, 62 y.o., female Today's Date: 03/22/2023  PCP: Lilyan Punt, MD REFERRING PROVIDER: Dr. Margarita Rana  END OF SESSION:  OT End of Session - 03/22/23 1435     Visit Number 5    Number of Visits 8    Date for OT Re-Evaluation 03/31/23    Authorization Type [employer], $30 copay    OT Start Time 1350    OT Stop Time 1422    OT Time Calculation (min) 32 min    Activity Tolerance Patient tolerated treatment well    Behavior During Therapy WFL for tasks assessed/performed              Past Medical History:  Diagnosis Date   Abdominal pain of unknown etiology    Anal fistula    hx of    Aortic atherosclerosis (HCC)    Constipation    severe, history of   DDD (degenerative disc disease), lumbar    Depression    Diarrhea    history of   GERD (gastroesophageal reflux disease)    History of hiatal hernia    Hypothyroidism    Iron deficiency anemia    Left shoulder pain    Low back pain    Lumbar herniated disc    Neck pain    Numbness of right lower extremity    Olecranon bursitis    Plantar fasciitis, left    Prediabetes    RA (rheumatoid arthritis) (HCC)    Right carpal tunnel syndrome 01/09/2018   S/P colonoscopy April 2011   Dr. Juanda Chance: mild diverticulosis, hyperplastic polyps, repeat in 10 years   S/P endoscopy April 2011   Dr. Juanda Chance: no hiatal hernia, generous opening to antrum., patent gastrojejunostomy   Sciatic leg pain    Right   Ulnar neuropathy at elbow, right 01/09/2018   Vitamin D deficiency    Weakness of right lower extremity    Past Surgical History:  Procedure Laterality Date   anal fistulostomy     w/marsupialization an excision of anal tags   BACK SURGERY  10/2017   BREAST LUMPECTOMY Right    benign   CARPAL TUNNEL WITH CUBITAL TUNNEL Right 02/06/2018   Procedure: CARPAL TUNNEL RELEASE RIGHT TENOSYNOVECTOMY;  Surgeon: Cindee Salt, MD;   Location: [facility];  Service: Orthopedics;  Laterality: Right;  regional block   COLONOSCOPY     ENDOMETRIAL ABLATION     ESOPHAGOGASTRODUODENOSCOPY     GASTRIC BYPASS     mini gastric bypass in [place] reportedly   ROTATOR CUFF REPAIR  07/2017   ULNAR NERVE TRANSPOSITION Right 02/06/2018   Procedure: ULNAR NERVE DECOMPRESSION;  Surgeon: Cindee Salt, MD;  Location: [facility];  Service: Orthopedics;  Laterality: Right;  regional block    ULNAR TUNNEL RELEASE Right 02/06/2018   Procedure: CUBITAL TUNNEL RELEASE;  Surgeon: Cindee Salt, MD;  Location: [facility];  Service: Orthopedics;  Laterality: Right;  regoinal block   Patient Active Problem List   Diagnosis Date Noted   Chronic low back pain 07/11/2022   Degeneration of lumbosacral intervertebral disc 07/11/2022   Dysuria 07/11/2022   Obesity 07/11/2022   Piriformis syndrome 07/11/2022   Lumbar radiculopathy 03/17/2020   Candidiasis of skin 01/31/2020   Increased body mass index 09/10/2019   Closed fracture of right distal radius 05/07/2019   Cough 08/25/2018   Entrapment of right ulnar nerve 01/17/2018   Right  carpal tunnel syndrome 01/09/2018   Ulnar neuropathy at elbow, right 01/09/2018   HNP (herniated nucleus pulposus), lumbar 11/01/2017   Elevated transaminase level 09/21/2015   Vitamin D deficiency 09/21/2015   History of gastric bypass 09/21/2015   Anemia, iron deficiency 09/21/2015   Hypothyroidism 05/20/2014   Rheumatoid arthritis (HCC) 07/11/2013   Constipation 01/19/2011   Abdominal discomfort in left lower quadrant 01/19/2011   GERD 01/01/2010   HIATAL HERNIA 01/01/2010   ANAL FISTULA 01/01/2010   ABDOMINAL PAIN, UNSPECIFIED SITE 01/01/2010    ONSET DATE: ~3 months  REFERRING DIAG: left shoulder tendonitis  THERAPY DIAG:  Acute pain of left shoulder  Other symptoms and signs involving the musculoskeletal system  Rationale for Evaluation and Treatment:  Rehabilitation  SUBJECTIVE:   SUBJECTIVE STATEMENT: S: "It's sore all the time."   PERTINENT HISTORY: Pt is a 62 y/o female presenting with left shoulder pain present for approximately 3 months. Pt reports it is worse when she is using it a lot. Pt with PMH of left RCR in 07/2017.   PRECAUTIONS: None  WEIGHT BEARING RESTRICTIONS: No  PAIN:  Are you having pain? Yes: NPRS scale: 5/10 Pain location: left shoulder Pain description: aching Aggravating factors: increased use Relieving factors: rest, heat  FALLS: Has patient fallen in last 6 months? No  PLOF: Independent  PATIENT GOALS: To have less pain in the left arm.   NEXT MD VISIT: 04/03/23  OBJECTIVE:   HAND DOMINANCE: Right  ADLs: Overall ADLs: Pt with difficulty with household chores, reaching behind back, reaching up and overhead. Pt with limited ability to lift items. Pt with difficulty sleeping, is unable to sleep on the left side.    FUNCTIONAL OUTCOME MEASURES: FOTO: 52/100 03/22/23: 55/100   UPPER EXTREMITY ROM:     Assessed seated, er/IR adducted  Active ROM Left eval Left 03/22/23  Shoulder flexion 139 140  Shoulder abduction 136 148  Shoulder internal rotation 90 90  Shoulder external rotation 61 60  (Blank rows = not tested)    Assessed supine, er/IR adducted    Passive ROM Left eval Left 03/22/23  Shoulder flexion 146 151  Shoulder abduction 170 170  Shoulder internal rotation 90 90  Shoulder external rotation 80 78  (Blank rows = not tested)  UPPER EXTREMITY MMT:     Assessed seated, er/IR adducted  MMT Left eval Left 03/22/23  Shoulder flexion 4/5 4/5  Shoulder abduction 4/5 4/5  Shoulder internal rotation 5/5 5/5  Shoulder external rotation 4/5 4/5  (Blank rows = not tested)   OBSERVATIONS: moderate fascial restrictions in left upper arm   TODAY'S TREATMENT:                                                                                                                               DATE:    03/22/23 -Manual Therapy: myofascial release and trigger point applied to biceps, trapezius, and scapula area in order to reduce pain  and fascial restrictions and improve ROM.  -A/ROM: supine-protraction, flexion, horizontal abduction, er, abduction, 5 reps -A/ROM: sitting-protraction, flexion, horizontal abduction, er, abduction, 5 reps  03/15/23 -Manual Therapy: myofascial release and trigger point applied to biceps, trapezius, and scapula area in order to reduce pain and fascial restrictions and improve ROM.  -A/ROM: seated, flexion, abduction, protraction, horizontal abduction, er/IR, x12 -Latissimus Stretch, 4x20" alternating BUE on top -Stretching: flexion, er, doorway stretch, er behind the back, IR over the head, 3x20" -Wall Slides: flexion and abduction, x10  03/08/23 -Manual Therapy: myofascial release and trigger point applied to biceps, trapezius, and scapula area in order to reduce pain and fascial restrictions and improve ROM.  -AA/ROM: supine, flexion, abduction, protraction, horizontal abduction, er/IR, x12 -Isometrics: flexion, extension, abduction, er, IR, 5x10" -A/ROM: seated, flexion, abduction, protraction, horizontal abduction, er/IR, x10   PATIENT EDUCATION: Education details: alternate stretches and A/ROM Person educated: Patient Education method: Programmer, multimedia, Facilities manager, and Handouts Education comprehension: verbalized understanding and returned demonstration  HOME EXERCISE PROGRAM: Eval: shoulder stretches 5/17: AA/ROM 5/22: Isometrics 5/29: Stretches (flexion, er, doorway stretch, er behind the back, IR over the head)  GOALS: Goals reviewed with patient? Yes  SHORT TERM GOALS: Target date: 03/31/23  Pt will be provided with and educated on HEP to improve mobility of LUE required for use during ADLs.   Goal status: IN PROGRESS  2.  Pt will decrease pain in LUE to 3/10 or less to improve ability to sleep for 3+ consecutive hours without  waking due to pain.   Goal status: IN PROGRESS  3.  Pt will increase A/ROM of LUE by 20+ degrees to improve ability to perform reaching tasks required for washing back and hair.    Goal status: IN PROGRESS  4.  Pt will decrease LUE fascial restrictions to minimal amounts or less to improve mobility required for functional reaching such as reaching overhead or behind back during ADLs.   Goal status: IN PROGRESS  5.  Pt will increase LUE strength to 4+/5 or greater to improve ability to lift weighted items such as pots and pans and completing household or outdoor tasks.   Goal status: IN PROGRESS    ASSESSMENT:  CLINICAL IMPRESSION: Reassessment completed this session. Pt continues to have pain and popping limiting ability to use the LUE during functional tasks. Pt has not met any goals and progress towards goals is limited. Pt has made minimal gains in ROM and strength remains the same, glenohumeral joint mobility remains good. Discussed current functional status with pt, who is scheduled for MD appt on 04/03/23 and thinks the MD recommend an MRI. Recommended holding therapy until after MD appt and MRI if one is scheduled, as therapy has not promoted any change in function or pain at this time. Provided updated HEP to complete in the meantime working on gentle stretches and continued ROM tasks. Pt is agreeable to plan.   PERFORMANCE DEFICITS: in functional skills including ADLs, IADLs, coordination, tone, ROM, strength, pain, fascial restrictions, muscle spasms, and UE functional use   PLAN:  OT FREQUENCY: 2x/week  OT DURATION: 4 weeks  PLANNED INTERVENTIONS: self care/ADL training, therapeutic exercise, therapeutic activity, neuromuscular re-education, manual therapy, passive range of motion, splinting, electrical stimulation, ultrasound, moist heat, cryotherapy, patient/family education, and DME and/or AE instructions  RECOMMENDED OTHER SERVICES: None  CONSULTED AND AGREED WITH  PLAN OF CARE: Patient  PLAN FOR NEXT SESSION: Hold until after MRI and MD follow up    Ezra Sites, OTR/L  [phone] 03/22/2023,  2:35 PM

## 2023-03-22 NOTE — Patient Instructions (Signed)

## 2023-05-26 ENCOUNTER — Telehealth: Payer: Self-pay | Admitting: Hematology and Oncology

## 2023-06-07 ENCOUNTER — Encounter: Payer: Self-pay | Admitting: Family Medicine

## 2023-06-07 ENCOUNTER — Telehealth (INDEPENDENT_AMBULATORY_CARE_PROVIDER_SITE_OTHER): Payer: 59 | Admitting: Family Medicine

## 2023-06-07 VITALS — Ht 64.0 in | Wt 219.0 lb

## 2023-06-07 DIAGNOSIS — R21 Rash and other nonspecific skin eruption: Secondary | ICD-10-CM | POA: Diagnosis not present

## 2023-06-07 DIAGNOSIS — E7800 Pure hypercholesterolemia, unspecified: Secondary | ICD-10-CM

## 2023-06-07 MED ORDER — TRIAMCINOLONE ACETONIDE 0.1 % EX CREA
TOPICAL_CREAM | CUTANEOUS | 1 refills | Status: DC
Start: 2023-06-07 — End: 2023-06-29

## 2023-06-07 MED ORDER — CONTRAVE 8-90 MG PO TB12: ORAL_TABLET | ORAL | 0 refills | Status: DC

## 2023-06-07 NOTE — Progress Notes (Signed)
   Subjective:    Patient ID: [name], female    DOB: 1960-12-30, 62 y.o.   MRN: [id number] Virtual Visit via Video Note  I connected with [name] on 06/07/23 at  1:10 PM EDT by a video enabled telemedicine application and verified that I am speaking with the correct person using two identifiers.  Location: Patient: Home Provider: Home office   I discussed the limitations of evaluation and management by telemedicine and the availability of in person appointments. The patient expressed understanding and agreed to proceed.  History of Present Illness:    Observations/Objective:   Assessment and Plan:   Follow Up Instructions:    I discussed the assessment and treatment plan with the patient. The patient was provided an opportunity to ask questions and all were answered. The patient agreed with the plan and demonstrated an understanding of the instructions.   The patient was advised to call back or seek an in-person evaluation if the symptoms worsen or if the condition fails to improve as anticipated.  I provided 15 minutes of non-face-to-face time during this encounter.   Colleen Punt, MD  HPI Pt has a video visit today regarding a rash on her chest, pt states the rash appears and is painful for example when in shower and water touches it. Patient states she had a similar rash on her legs she is using it couple creams that the dermatologist gave her she has a follow-up appointment with them for September the rash on the chest comes and goes sometimes visible sometimes not I encouraged her to try triamcinolone cream when it comes and lay off of the clobetasol because that can thin the skin I also encouraged her to see dermatology coming up in September as planned  She also relates that she has had a very difficult time with her weight she tries to watch her diet she tries to stay relatively active states she has tried weight watchers in the past she has tried my  fitness pal and neither 1 of these helped her lose weight.  She is discouraged that her weight is going up she finds it is affecting her health she does not have any history of heart attacks or strokes but she is at risk for coronary artery disease due to her weight and age  Review of Systems     Objective:   Physical Exam  Patient had virtual visit-video Appears to be in no distress Atraumatic Neuro able to relate and oriented No apparent resp distress Color normal       Assessment & Plan:  .1. Rash Triamcinolone cream as needed Hold off on clobetasol currently See dermatology in September - triamcinolone cream (KENALOG) 0.1 %; Apply thin amount twice daily to rash as needed  Dispense: 45 g; Refill: 1  2. Elevated cholesterol Healthy diet regular activity HDL is very good LDL at goal  3. Morbid obesity (HCC) Portion control regular physical activity Virtual app such as my fitness pal, weight watchers, or other Recommend joining forces with other people like her who are trying to lose weight Avoid sugary drinks Avoid alcohol Contrave discussed GLP-1's unlikely to be covered by her insurance Would recommend trying Contrave first because it appears to be covered

## 2023-06-29 ENCOUNTER — Inpatient Hospital Stay: Payer: 59 | Attending: Hematology and Oncology | Admitting: Hematology and Oncology

## 2023-06-29 ENCOUNTER — Other Ambulatory Visit: Payer: Self-pay | Admitting: Hematology and Oncology

## 2023-06-29 ENCOUNTER — Inpatient Hospital Stay: Payer: 59

## 2023-06-29 VITALS — BP 125/78 | HR 64 | Temp 98.1°F | Resp 17 | Ht 64.0 in | Wt 220.9 lb

## 2023-06-29 DIAGNOSIS — Z880 Allergy status to penicillin: Secondary | ICD-10-CM | POA: Insufficient documentation

## 2023-06-29 DIAGNOSIS — Z825 Family history of asthma and other chronic lower respiratory diseases: Secondary | ICD-10-CM | POA: Insufficient documentation

## 2023-06-29 DIAGNOSIS — Z8719 Personal history of other diseases of the digestive system: Secondary | ICD-10-CM | POA: Diagnosis not present

## 2023-06-29 DIAGNOSIS — D508 Other iron deficiency anemias: Secondary | ICD-10-CM

## 2023-06-29 DIAGNOSIS — D500 Iron deficiency anemia secondary to blood loss (chronic): Secondary | ICD-10-CM | POA: Diagnosis not present

## 2023-06-29 DIAGNOSIS — M069 Rheumatoid arthritis, unspecified: Secondary | ICD-10-CM | POA: Insufficient documentation

## 2023-06-29 DIAGNOSIS — E039 Hypothyroidism, unspecified: Secondary | ICD-10-CM | POA: Insufficient documentation

## 2023-06-29 DIAGNOSIS — Z9884 Bariatric surgery status: Secondary | ICD-10-CM | POA: Diagnosis not present

## 2023-06-29 DIAGNOSIS — R5383 Other fatigue: Secondary | ICD-10-CM | POA: Insufficient documentation

## 2023-06-29 DIAGNOSIS — Z8249 Family history of ischemic heart disease and other diseases of the circulatory system: Secondary | ICD-10-CM | POA: Insufficient documentation

## 2023-06-29 DIAGNOSIS — Z79899 Other long term (current) drug therapy: Secondary | ICD-10-CM | POA: Insufficient documentation

## 2023-06-29 LAB — CMP (CANCER CENTER ONLY)
ALT: 18 U/L (ref 0–44)
AST: 21 U/L (ref 15–41)
Albumin: 3.9 g/dL (ref 3.5–5.0)
Alkaline Phosphatase: 96 U/L (ref 38–126)
Anion gap: 6 (ref 5–15)
BUN: 7 mg/dL — ABNORMAL LOW (ref 8–23)
CO2: 29 mmol/L (ref 22–32)
Calcium: 9.1 mg/dL (ref 8.9–10.3)
Chloride: 100 mmol/L (ref 98–111)
Creatinine: 0.77 mg/dL (ref 0.44–1.00)
GFR, Estimated: >60 mL/min (ref >60)
Glucose, Bld: 93 mg/dL (ref 70–99)
Potassium: 3.9 mmol/L (ref 3.5–5.1)
Sodium: 135 mmol/L (ref 135–145)
Total Bilirubin: 0.4 mg/dL (ref 0.3–1.2)
Total Protein: 7.2 g/dL (ref 6.5–8.1)

## 2023-06-29 LAB — CBC WITH DIFFERENTIAL (CANCER CENTER ONLY)
Abs Immature Granulocytes: 0.01 10*3/uL (ref 0.00–0.07)
Basophils Absolute: 0.1 10*3/uL (ref 0.0–0.1)
Basophils Relative: 2 %
Eosinophils Absolute: 0.1 10*3/uL (ref 0.0–0.5)
Eosinophils Relative: 3 %
HCT: 37.8 % (ref 36.0–46.0)
Hemoglobin: 12.2 g/dL (ref 12.0–15.0)
Immature Granulocytes: 0 %
Lymphocytes Relative: 41 %
Lymphs Abs: 1.7 10*3/uL (ref 0.7–4.0)
MCH: 28.7 pg (ref 26.0–34.0)
MCHC: 32.3 g/dL (ref 30.0–36.0)
MCV: 88.9 fL (ref 80.0–100.0)
Monocytes Absolute: 0.4 10*3/uL (ref 0.1–1.0)
Monocytes Relative: 11 %
Neutro Abs: 1.8 10*3/uL (ref 1.7–7.7)
Neutrophils Relative %: 43 %
Platelet Count: 295 10*3/uL (ref 150–400)
RBC: 4.25 MIL/uL (ref 3.87–5.11)
RDW: 12.9 % (ref 11.5–15.5)
WBC Count: 4.1 10*3/uL (ref 4.0–10.5)
nRBC: 0 % (ref 0.0–0.2)

## 2023-06-29 LAB — RETIC PANEL
Immature Retic Fract: 6.2 % (ref 2.3–15.9)
RBC.: 4.2 MIL/uL (ref 3.87–5.11)
Retic Count, Absolute: 37.8 10*3/uL (ref 19.0–186.0)
Retic Ct Pct: 0.9 % (ref 0.4–3.1)
Reticulocyte Hemoglobin: 32.2 pg (ref >27.9)

## 2023-06-29 LAB — IRON AND IRON BINDING CAPACITY (CC-WL,HP ONLY)
Iron: 52 ug/dL (ref 28–170)
Saturation Ratios: 11 % (ref 10.4–31.8)
TIBC: 489 ug/dL — ABNORMAL HIGH (ref 250–450)
UIBC: 437 ug/dL (ref 148–442)

## 2023-06-29 LAB — FERRITIN: Ferritin: 10 ng/mL — ABNORMAL LOW (ref 11–307)

## 2023-06-29 NOTE — Progress Notes (Signed)
[facility] [phone]   [phone]  PROGRESS NOTE  Patient Care Team: Babs Sciara, MD as PCP - General (Family Medicine) West Bali, MD (Inactive) (Gastroenterology) Casimer Lanius, MD as Consulting Physician (Rheumatology) Janalyn Harder, MD (Inactive) as Consulting Physician (Dermatology)  Hematological/Oncological History # Iron Deficiency Anemia 2/2 to Gastric Bypass 10/07/2020: WBC 5.2, Hgb 10.7, MCV 81, Plt 293. Iron 30, TIBC 447, Ferritin 9, Iron sat 7%.  10/30/2020: establish care with Dr. Leonides Schanz  11/13/2020-12/11/2020: 5 doses of IV iron sucrose 200mg  weekly 12/18/2020: WBC 4.5, Hgb 12.2, MCV 83.2, Plt 244 03/25/2021: WBC 5.1, Hgb 14.0, MCV 96.6, Plt 206  Interval History:  [name] 62 y.o. female with medical history significant for iron deficiency anemia 2/2 to Gastric bipass who presents for a follow up visit. The patient's last visit was on 08/23/2022. In the interim since the last visit she has begun developing ice cravings and is concerned she may be getting iron deficiency.  On exam today [name] reports over the last 3 to 4 weeks she has been eating more ice.  She is also been feeling fatigued but thinks that may be "everyday life".  She has been gaining weight and is up to 220 pounds from 205 pounds last November.  She reports that she has not been approved for Ozempic disappointingly.  She reports she is not having any lightheadedness, dizziness, shortness of breath.  She has been eating well and trying to eat some iron rich foods.  She reports that she tolerated her 5 sessions of IV iron well last time and was willing to do IV iron again if necessary.  Otherwise she has had no other overt signs of bleeding such as nosebleeds, gum bleeding, or dark stools.  She otherwise denies any fevers, chills, sweats, nausea, vomiting or diarrhea.  A full 10 point ROS is listed below.  MEDICAL HISTORY:  Past Medical History:   Diagnosis Date   Abdominal pain of unknown etiology    Anal fistula    hx of    Aortic atherosclerosis (HCC)    Constipation    severe, history of   DDD (degenerative disc disease), lumbar    Depression    Diarrhea    history of   GERD (gastroesophageal reflux disease)    History of hiatal hernia    Hypothyroidism    Iron deficiency anemia    Left shoulder pain    Low back pain    Lumbar herniated disc    Neck pain    Numbness of right lower extremity    Olecranon bursitis    Plantar fasciitis, left    Prediabetes    RA (rheumatoid arthritis) (HCC)    Right carpal tunnel syndrome 01/09/2018   S/P colonoscopy April 2011   Dr. Juanda Chance: mild diverticulosis, hyperplastic polyps, repeat in 10 years   S/P endoscopy April 2011   Dr. Juanda Chance: no hiatal hernia, generous opening to antrum., patent gastrojejunostomy   Sciatic leg pain    Right   Ulnar neuropathy at elbow, right 01/09/2018   Vitamin D deficiency    Weakness of right lower extremity     SURGICAL HISTORY: Past Surgical History:  Procedure Laterality Date   anal fistulostomy     w/marsupialization an excision of anal tags   BACK SURGERY  10/2017   BREAST LUMPECTOMY Right    benign   CARPAL TUNNEL WITH CUBITAL TUNNEL Right 02/06/2018   Procedure: CARPAL TUNNEL RELEASE RIGHT TENOSYNOVECTOMY;  Surgeon: Cindee Salt, MD;  Location: [facility];  Service: Orthopedics;  Laterality: Right;  regional block   COLONOSCOPY     ENDOMETRIAL ABLATION     ESOPHAGOGASTRODUODENOSCOPY     GASTRIC BYPASS     mini gastric bypass in [place] reportedly   ROTATOR CUFF REPAIR  07/2017   ULNAR NERVE TRANSPOSITION Right 02/06/2018   Procedure: ULNAR NERVE DECOMPRESSION;  Surgeon: Cindee Salt, MD;  Location: [facility];  Service: Orthopedics;  Laterality: Right;  regional block    ULNAR TUNNEL RELEASE Right 02/06/2018   Procedure: CUBITAL TUNNEL RELEASE;  Surgeon: Cindee Salt, MD;  [facility];  Service: Orthopedics;  Laterality: Right;  regoinal block    SOCIAL HISTORY: Social History   Socioeconomic History   Marital status: Married    Spouse name: Not on file   Number of children: Not on file   Years of education: Not on file   Highest education level: Not on file  Occupational History   Occupation: Technician    Comment: [employer]   Tobacco Use   Smoking status: Former   Smokeless tobacco: Never   Tobacco comments:    quit 2006  Vaping Use   Vaping status: Never Used  Substance and Sexual Activity   Alcohol use: Yes    Alcohol/week: 1.0 standard drink of alcohol    Types: 1 Standard drinks or equivalent per week    Comment: social drinker   Drug use: No   Sexual activity: Yes    Partners: Male    Birth control/protection: None    Comment: spouse  Other Topics Concern   Not on file  Social History Narrative   Not on file   Social Determinants of Health   Financial Resource Strain: Not on file  Food Insecurity: Not on file  Transportation Needs: Not on file  Physical Activity: Not on file  Stress: Not on file  Social Connections: Not on file  Intimate Partner Violence: Not on file    FAMILY HISTORY: Family History  Problem Relation Age of Onset   COPD Mother        living   Heart disease Father        deceased   Colon cancer Neg Hx     ALLERGIES:  is allergic to penicillins.  MEDICATIONS:  Current Outpatient Medications  Medication Sig Dispense Refill   alclomethasone (ACLOVATE) 0.05 % cream APPLY TO THE AFFECTED AREAS OF THE FACE TWICE DAILY     Calcium Carb-Cholecalciferol (OYSTER SHELL CALCIUM W/D) 500-5 MG-MCG TABS Take by mouth.     Calcium Carbonate-Vitamin D (CALCIUM 600/VITAMIN D PO) Take 1 tablet by mouth daily.     Calcium Carbonate-Vitamin D 600-5 MG-MCG CAPS Take 1 tablet by mouth daily.     Cholecalciferol (VITAMIN D3) 50 MCG (2000 UT) capsule Take 2,000 Units by mouth daily.     clobetasol cream  (TEMOVATE) 0.05 %      DHEA 50 MG CAPS Take 50 mg by mouth daily.     folic acid (FOLVITE) 1 MG tablet Take 1 mg by mouth daily.  0   HUMIRA PEN 40 MG/0.4ML PNKT SMARTSIG:40 Milligram(s) SUB-Q Every 2 Weeks     ibuprofen (ADVIL) 600 MG tablet Take by mouth.     Lactobacillus (PROBIOTIC ACIDOPHILUS PO) Take by mouth.     levothyroxine (SYNTHROID) 100 MCG tablet Take 1 tablet (100 mcg total) by mouth daily. 90 tablet 3  lidocaine (XYLOCAINE) 1 % (with preservative) injection by Infiltration route.     magnesium oxide (MAG-OX) 400 MG tablet Take by mouth.     methotrexate (RHEUMATREX) 2.5 MG tablet Take 3 tablets (7.5 mg total) by mouth See admin instructions. Take 7.5 mg by mouth on Monday and Tuesday (Patient not taking: Reported on 06/07/2023) 4 tablet 0   Naltrexone-buPROPion HCl ER (CONTRAVE) 8-90 MG TB12 Start 1 tablet every morning for 7 days, then 1 tablet twice daily for 7 days, then 2 tablets every morning and one every evening 120 tablet 0   Sodium Caprylate POWD Take by mouth.     tacrolimus (PROTOPIC) 0.1 % ointment Apply topically.     triamcinolone cream (KENALOG) 0.1 % Apply thin amount twice daily to rash as needed 45 g 1   vitamin B-12 (CYANOCOBALAMIN) 1000 MCG tablet Take 1,000 mcg by mouth daily.     No current facility-administered medications for this visit.    REVIEW OF SYSTEMS:   Constitutional: ( - ) fevers, ( - )  chills , ( - ) night sweats Eyes: ( - ) blurriness of vision, ( - ) double vision, ( - ) watery eyes Ears, nose, mouth, throat, and face: ( - ) mucositis, ( - ) sore throat Respiratory: ( - ) cough, ( - ) dyspnea, ( - ) wheezes Cardiovascular: ( - ) palpitation, ( - ) chest discomfort, ( - ) lower extremity swelling Gastrointestinal:  ( - ) nausea, ( - ) heartburn, ( - ) change in bowel habits Skin: ( - ) abnormal skin rashes Lymphatics: ( - ) new lymphadenopathy, ( - ) easy bruising Neurological: ( - ) numbness, ( - ) tingling, ( - ) new  weaknesses Behavioral/Psych: ( - ) mood change, ( - ) new changes  All other systems were reviewed with the patient and are negative.  PHYSICAL EXAMINATION:  Vitals:   06/29/23 1019 06/29/23 1020  BP: 138/85 125/78  Pulse: 64   Resp: 17   Temp: 98.1 F (36.7 C)   SpO2: 99%       Filed Weights   06/29/23 1019  Weight: 220 lb 14.4 oz (100.2 kg)       GENERAL: well appearing middle aged Caucasian female alert, no distress and comfortable SKIN: skin color, texture, turgor are normal, no rashes or significant lesions EYES: conjunctiva are pink and non-injected, sclera clear LUNGS: clear to auscultation and percussion with normal breathing effort HEART: regular rate & rhythm and no murmurs and no lower extremity edema Musculoskeletal: no cyanosis of digits and no clubbing  PSYCH: alert & oriented x 3, fluent speech NEURO: no focal motor/sensory deficits  LABORATORY DATA:  I have reviewed the data as listed    Latest Ref Rng & Units 06/29/2023    9:36 AM 08/23/2022    9:42 AM 07/04/2022    9:41 AM  CBC  WBC 4.0 - 10.5 K/uL 4.1  5.3  4.9   Hemoglobin 12.0 - 15.0 g/dL 10.9  32.3  55.7   Hematocrit 36.0 - 46.0 % 37.8  37.9  39.8   Platelets 150 - 400 K/uL 295  287  270        Latest Ref Rng & Units 06/29/2023    9:36 AM 08/23/2022    9:42 AM 07/04/2022    9:41 AM  CMP  Glucose 70 - 99 mg/dL 93  89  96   BUN 8 - 23 mg/dL 7  11  9  Creatinine 0.44 - 1.00 mg/dL 1.61  0.96  0.45   Sodium 135 - 145 mmol/L 135  132  135   Potassium 3.5 - 5.1 mmol/L 3.9  3.9  4.8   Chloride 98 - 111 mmol/L 100  97  96   CO2 22 - 32 mmol/L 29  28  25    Calcium 8.9 - 10.3 mg/dL 9.1  9.5  9.7   Total Protein 6.5 - 8.1 g/dL 7.2  7.3  7.0   Total Bilirubin 0.3 - 1.2 mg/dL 0.4  0.5  0.5   Alkaline Phos 38 - 126 U/L 96  97  125   AST 15 - 41 U/L 21  20  25    ALT 0 - 44 U/L 18  22  22      RADIOGRAPHIC STUDIES: No results found.  ASSESSMENT & PLAN [name] 62 y.o. female with  medical history significant for iron deficiency anemia 2/2 to Gastric bypass who presents for a follow up visit.   After review the labs, the records, schedule the patient the findings most consistent with iron deficiency anemia secondary to a gastric bypass. She received the IV sucrose treatment x5 doses and had a increase in her hemoglobin up normal levels. Additionally her ferritin is above goal of 100 and her TIBC is returned to normal. Overall she is had an excellent response to the IV iron therapy. Given that the likely cause is poor absorption due to her gastric bypass we recommended she follow-up in 3 months time in order to assure that her iron stores are still replete. We will need at least periodic follow-up with her every 6 to 12 months in order to assure her iron levels are at goal.  # Iron Deficiency Anemia 2/2 to Gastric Bypass --patient completed 5 doses of IV iron sucrose 200mg  weekly from 11/13/2020-12/11/2020 --today labs show Hgb 12.2, white blood cell 4.1, MCV 88.9, and platelets of 295.  Iron studies are pending. --due to gastric bipass may require future IV iron treatments. Would not recommend PO iron supplementation as it would likely not absorb well.  --recommend RTC in 12 months time to assure her iron stores remain replete.   No orders of the defined types were placed in this encounter.   All questions were answered. The patient knows to call the clinic with any problems, questions or concerns.  A total of more than 25 minutes were spent on this encounter and over half of that time was spent on counseling and coordination of care as outlined above.   Ulysees Barns, MD Department of Hematology/Oncology [facility] Phone: [phone] Pager: [phone] Email: [email or web address]  06/29/2023 11:01 AM

## 2023-06-29 NOTE — Addendum Note (Signed)
Addended by: Mertha Finders A on: 06/29/2023 12:33 PM   Modules accepted: Orders

## 2023-07-04 ENCOUNTER — Other Ambulatory Visit: Payer: Self-pay | Admitting: Hematology and Oncology

## 2023-07-04 ENCOUNTER — Telehealth: Payer: Self-pay | Admitting: *Deleted

## 2023-07-04 NOTE — Telephone Encounter (Signed)
Received VM from patient requesting phone call regarding lab results from last week.  Routed to Dr Leonides Schanz for recommendation.

## 2023-07-04 NOTE — Telephone Encounter (Signed)
Please let Ms. Muhs know that her labs are consistent with persistent iron deficiency anemia.  I have placed a request for an additional round of IV iron therapy to be administered.  She should be hearing from the [facility] infusion center soon.  We will plan to see her back in December as scheduled to assure that the IV iron has worked.

## 2023-07-06 ENCOUNTER — Encounter: Payer: Self-pay | Admitting: Hematology and Oncology

## 2023-07-06 NOTE — Telephone Encounter (Signed)
PC to patient informing her of Dr Derek Mound recommendation as below, informed her the [facility] will be contacting her to schedule iron infusions.  She verbalizes understanding.

## 2023-07-10 ENCOUNTER — Ambulatory Visit: Payer: 59 | Admitting: Family Medicine

## 2023-07-11 ENCOUNTER — Telehealth: Payer: Self-pay

## 2023-07-11 NOTE — Telephone Encounter (Signed)
Auth Submission: APPROVED Site of care: Site of care: [facility] Payer: [employer] commercial Medication & CPT/J Code(s) submitted: Monoferric (Ferrci derisomaltose) [id number] Route of submission (phone, fax, portal): Portal Phone # Fax # Auth type: Buy/Bill PB Units/visits requested: 1000mg  x 1 dose Reference number: [id number] Approval from: 06/29/23 to 06/28/24

## 2023-07-19 ENCOUNTER — Ambulatory Visit: Payer: 59 | Admitting: Family Medicine

## 2023-07-19 ENCOUNTER — Ambulatory Visit: Payer: 59

## 2023-07-19 ENCOUNTER — Encounter: Payer: Self-pay | Admitting: Family Medicine

## 2023-07-19 VITALS — BP 111/75 | HR 63 | Temp 97.7°F | Ht 64.0 in | Wt 220.4 lb

## 2023-07-19 VITALS — BP 110/72 | HR 64 | Temp 97.3°F | Resp 18 | Ht 64.0 in | Wt 220.4 lb

## 2023-07-19 DIAGNOSIS — Z9884 Bariatric surgery status: Secondary | ICD-10-CM | POA: Diagnosis not present

## 2023-07-19 DIAGNOSIS — E7800 Pure hypercholesterolemia, unspecified: Secondary | ICD-10-CM | POA: Diagnosis not present

## 2023-07-19 DIAGNOSIS — D508 Other iron deficiency anemias: Secondary | ICD-10-CM | POA: Diagnosis not present

## 2023-07-19 DIAGNOSIS — E038 Other specified hypothyroidism: Secondary | ICD-10-CM

## 2023-07-19 MED ORDER — SODIUM CHLORIDE 0.9 % IV SOLN
1000.0000 mg | Freq: Once | INTRAVENOUS | Status: AC
  Administered 2023-07-19: 1000 mg via INTRAVENOUS
  Filled 2023-07-19: qty 10

## 2023-07-19 MED ORDER — CONTRAVE 8-90 MG PO TB12
ORAL_TABLET | ORAL | 5 refills | Status: DC
Start: 2023-07-19 — End: 2024-03-27

## 2023-07-19 NOTE — Patient Instructions (Signed)

## 2023-07-19 NOTE — Progress Notes (Signed)
Diagnosis: Iron Deficiency Anemia  Provider:  Chilton Greathouse MD  Procedure: IV Infusion  IV Type: Peripheral, IV Location: R Antecubital  Monoferric (Ferric Derisomaltose), Dose: 1000 mg  Infusion Start Time: 1332  Infusion Stop Time: 1358  Post Infusion IV Care: Observation period completed and Peripheral IV Discontinued  Discharge: Condition: Good, Destination: Home . AVS Provided  Performed by:  Rico Ala, LPN

## 2023-07-19 NOTE — Progress Notes (Signed)
Subjective:    Patient ID: [name], female    DOB: 1961-01-05, 62 y.o.   MRN: [id number]  Discussed the use of AI scribe software for clinical note transcription with the patient, who gave verbal consent to proceed.  History of Present Illness   The patient, with a history of squamous cell carcinoma and weight management issues, presents with concerns about ineffective weight loss medication and low iron levels. The patient reports dissatisfaction with the current weight loss medication, Contrave, stating that they have not noticed any significant changes in weight since starting the medication. The patient also mentions improved sleep since starting Contrave but is unsure if this is related to the medication.  The patient is scheduled for an iron infusion due to a low ferritin level of 10, which they believe is contributing to their feelings of fatigue and lack of energy. They report a history of iron deficiency and have previously undergone multiple iron infusions. The patient also mentions a craving for ice, a known pica associated with iron deficiency.  In addition to these primary concerns, the patient reports recent surgery for squamous cell carcinoma and has stitches in place. They mention having several other potential sites of concern that may require future intervention. The patient also expresses frustration with the high cost of effective weight loss medications and the lack of insurance coverage for these medications.  The patient acknowledges struggling with medication adherence, particularly with the evening dose of Contrave and the twice-daily iron supplement, Focaltrate. They express a desire to continue with Contrave until their iron levels are normalized, hypothesizing that their low iron levels may be affecting their appetite and response to the weight loss medication.  The patient's weight management issues and iron deficiency are ongoing concerns that significantly  impact their quality of life. They express a strong desire for effective interventions and are open to adjusting their medication regimen to improve outcomes.         Review of Systems     Objective:    Physical Exam         Lungs are clear heart regular extremities no edema skin warm dry neurologic grossly normal no acute distress      Assessment & Plan:  Assessment and Plan    Iron Deficiency Anemia Low ferritin level with symptoms of fatigue. Scheduled for iron infusion today.   Weight Management Patient on Contrave with minimal effect. Discussed GLP-1 agonists as a more effective option but currently not covered by patient's insurance. -Continue Contrave, increase to 2 tablets in the morning and 2 tablets in the evening after tolerating 2 tablets in the morning and 1 tablet in the evening for 2 weeks. -Advise patient to check with insurance company for coverage of GLP-1 agonists in January.  Squamous Cell Carcinoma Recent surgical removal with clear margins. -Advise sunblock use and avoidance of excessive sun exposure.  General Health Maintenance -Order follow-up thyroid and cholesterol blood tests between now and end of the year.      Lipid profile ordered for hyperlipidemia T4 TSH ordered for hypothyroidism Follow-up here 6 months

## 2023-08-08 LAB — LIPID PANEL
Chol/HDL Ratio: 1.7 {ratio} (ref 0.0–4.4)
Cholesterol, Total: 179 mg/dL (ref 100–199)
HDL: 105 mg/dL (ref >39)
LDL Chol Calc (NIH): 65 mg/dL (ref 0–99)
Triglycerides: 42 mg/dL (ref 0–149)
VLDL Cholesterol Cal: 9 mg/dL (ref 5–40)

## 2023-08-08 LAB — TSH+FREE T4
Free T4: 1.17 ng/dL (ref 0.82–1.77)
TSH: 2.76 u[IU]/mL (ref 0.450–4.500)

## 2023-08-21 ENCOUNTER — Ambulatory Visit: Payer: 59 | Admitting: Hematology and Oncology

## 2023-09-20 ENCOUNTER — Inpatient Hospital Stay: Payer: 59 | Admitting: Hematology and Oncology

## 2023-09-20 ENCOUNTER — Other Ambulatory Visit: Payer: Self-pay | Admitting: Hematology and Oncology

## 2023-09-20 ENCOUNTER — Encounter: Payer: Self-pay | Admitting: Physician Assistant

## 2023-09-20 ENCOUNTER — Encounter: Payer: Self-pay | Admitting: Hematology and Oncology

## 2023-09-20 ENCOUNTER — Other Ambulatory Visit: Payer: Self-pay | Admitting: Physician Assistant

## 2023-09-20 ENCOUNTER — Ambulatory Visit (INDEPENDENT_AMBULATORY_CARE_PROVIDER_SITE_OTHER): Payer: 59 | Admitting: Physician Assistant

## 2023-09-20 ENCOUNTER — Inpatient Hospital Stay: Payer: 59 | Attending: Hematology and Oncology

## 2023-09-20 VITALS — BP 118/72 | Temp 97.9°F | Ht 64.0 in | Wt 223.2 lb

## 2023-09-20 VITALS — BP 105/56 | HR 56 | Temp 97.8°F | Resp 16 | Ht 64.0 in | Wt 224.2 lb

## 2023-09-20 DIAGNOSIS — R300 Dysuria: Secondary | ICD-10-CM

## 2023-09-20 DIAGNOSIS — Z79890 Hormone replacement therapy (postmenopausal): Secondary | ICD-10-CM | POA: Diagnosis not present

## 2023-09-20 DIAGNOSIS — M069 Rheumatoid arthritis, unspecified: Secondary | ICD-10-CM | POA: Diagnosis not present

## 2023-09-20 DIAGNOSIS — Z8249 Family history of ischemic heart disease and other diseases of the circulatory system: Secondary | ICD-10-CM | POA: Diagnosis not present

## 2023-09-20 DIAGNOSIS — Z9884 Bariatric surgery status: Secondary | ICD-10-CM | POA: Diagnosis not present

## 2023-09-20 DIAGNOSIS — Z8719 Personal history of other diseases of the digestive system: Secondary | ICD-10-CM | POA: Diagnosis not present

## 2023-09-20 DIAGNOSIS — D500 Iron deficiency anemia secondary to blood loss (chronic): Secondary | ICD-10-CM

## 2023-09-20 DIAGNOSIS — D508 Other iron deficiency anemias: Secondary | ICD-10-CM | POA: Insufficient documentation

## 2023-09-20 DIAGNOSIS — E039 Hypothyroidism, unspecified: Secondary | ICD-10-CM | POA: Diagnosis not present

## 2023-09-20 DIAGNOSIS — Z825 Family history of asthma and other chronic lower respiratory diseases: Secondary | ICD-10-CM | POA: Insufficient documentation

## 2023-09-20 DIAGNOSIS — Z880 Allergy status to penicillin: Secondary | ICD-10-CM | POA: Diagnosis not present

## 2023-09-20 LAB — CMP (CANCER CENTER ONLY)
ALT: 36 U/L (ref 0–44)
AST: 31 U/L (ref 15–41)
Albumin: 3.9 g/dL (ref 3.5–5.0)
Alkaline Phosphatase: 104 U/L (ref 38–126)
Anion gap: 4 — ABNORMAL LOW (ref 5–15)
BUN: 9 mg/dL (ref 8–23)
CO2: 30 mmol/L (ref 22–32)
Calcium: 9.2 mg/dL (ref 8.9–10.3)
Chloride: 103 mmol/L (ref 98–111)
Creatinine: 0.78 mg/dL (ref 0.44–1.00)
GFR, Estimated: >60 mL/min (ref >60)
Glucose, Bld: 194 mg/dL — ABNORMAL HIGH (ref 70–99)
Potassium: 3.9 mmol/L (ref 3.5–5.1)
Sodium: 137 mmol/L (ref 135–145)
Total Bilirubin: 0.3 mg/dL (ref <1.2)
Total Protein: 6.7 g/dL (ref 6.5–8.1)

## 2023-09-20 LAB — RETIC PANEL
Immature Retic Fract: 4.6 % (ref 2.3–15.9)
RBC.: 4.21 MIL/uL (ref 3.87–5.11)
Retic Count, Absolute: 39.2 10*3/uL (ref 19.0–186.0)
Retic Ct Pct: 0.9 % (ref 0.4–3.1)
Reticulocyte Hemoglobin: 34.1 pg (ref >27.9)

## 2023-09-20 LAB — POCT URINALYSIS DIPSTICK
Spec Grav, UA: 1.01 (ref 1.010–1.025)
pH, UA: 6 (ref 5.0–8.0)

## 2023-09-20 LAB — CBC WITH DIFFERENTIAL (CANCER CENTER ONLY)
Abs Immature Granulocytes: 0.01 10*3/uL (ref 0.00–0.07)
Basophils Absolute: 0.1 10*3/uL (ref 0.0–0.1)
Basophils Relative: 1 %
Eosinophils Absolute: 0.1 10*3/uL (ref 0.0–0.5)
Eosinophils Relative: 3 %
HCT: 39.7 % (ref 36.0–46.0)
Hemoglobin: 12.8 g/dL (ref 12.0–15.0)
Immature Granulocytes: 0 %
Lymphocytes Relative: 39 %
Lymphs Abs: 2.1 10*3/uL (ref 0.7–4.0)
MCH: 29.3 pg (ref 26.0–34.0)
MCHC: 32.2 g/dL (ref 30.0–36.0)
MCV: 90.8 fL (ref 80.0–100.0)
Monocytes Absolute: 0.4 10*3/uL (ref 0.1–1.0)
Monocytes Relative: 8 %
Neutro Abs: 2.6 10*3/uL (ref 1.7–7.7)
Neutrophils Relative %: 49 %
Platelet Count: 248 10*3/uL (ref 150–400)
RBC: 4.37 MIL/uL (ref 3.87–5.11)
RDW: 14.8 % (ref 11.5–15.5)
WBC Count: 5.3 10*3/uL (ref 4.0–10.5)
nRBC: 0 % (ref 0.0–0.2)

## 2023-09-20 LAB — IRON AND IRON BINDING CAPACITY (CC-WL,HP ONLY)
Iron: 98 ug/dL (ref 28–170)
Saturation Ratios: 29 % (ref 10.4–31.8)
TIBC: 343 ug/dL (ref 250–450)
UIBC: 245 ug/dL (ref 148–442)

## 2023-09-20 NOTE — Progress Notes (Signed)
[facility] Telephone:([phone]) [phone]   Fax:(336) [phone]  PROGRESS NOTE  Patient Care Team: Babs Sciara, MD as PCP - General (Family Medicine) West Bali, MD (Inactive) (Gastroenterology) Casimer Lanius, MD as Consulting Physician (Rheumatology) Janalyn Harder, MD (Inactive) as Consulting Physician (Dermatology)  Hematological/Oncological History # Iron Deficiency Anemia 2/2 to Gastric Bypass 10/07/2020: WBC 5.2, Hgb 10.7, MCV 81, Plt 293. Iron 30, TIBC 447, Ferritin 9, Iron sat 7%.  10/30/2020: establish care with Dr. Leonides Schanz  11/13/2020-12/11/2020: 5 doses of IV iron sucrose 200mg  weekly 12/18/2020: WBC 4.5, Hgb 12.2, MCV 83.2, Plt 244 03/25/2021: WBC 5.1, Hgb 14.0, MCV 96.6, Plt 206 07/19/2023: IV monoferric 1000 mg x 1 dose  Interval History:  [name] 62 y.o. female with medical history significant for iron deficiency anemia 2/2 to Gastric bipass who presents for a follow up visit. The patient's last visit was on 06/29/2023. In the interim since the last visit she has begun developing ice cravings and is concerned she may be getting iron deficiency.  On exam today [name] reports she does not get the booster from the Monoferric injection that she was hoping.  She reports her energy is about a 6 out of 10 today.  She notes the infusion went well overall with no major side effects.  She reports she did not get the usual boost that she gets with IV iron therapy.  She is not having any lightheadedness, dizziness, shortness of breath.  She reports that she is eating well but "too well".  She has not had any overt signs of bleeding.  She notes that she is eating some occasional iron rich food but is aware does not absorb well.  She did recently have a urinary tract infection but is had no recent runny nose, sore throat, or cough.  She is going through squamous cell lesion resections from her lower extremities.  She notes that these lesions are healing well.    Otherwise she has had no other overt signs of bleeding such as nosebleeds, gum bleeding, or dark stools.  She otherwise denies any fevers, chills, sweats, nausea, vomiting or diarrhea.  A full 10 point ROS is listed below.  MEDICAL HISTORY:  Past Medical History:  Diagnosis Date   Abdominal pain of unknown etiology    Anal fistula    hx of    Aortic atherosclerosis (HCC)    Constipation    severe, history of   DDD (degenerative disc disease), lumbar    Depression    Diarrhea    history of   GERD (gastroesophageal reflux disease)    History of hiatal hernia    Hypothyroidism    Iron deficiency anemia    Left shoulder pain    Low back pain    Lumbar herniated disc    Neck pain    Numbness of right lower extremity    Olecranon bursitis    Plantar fasciitis, left    Prediabetes    RA (rheumatoid arthritis) (HCC)    Right carpal tunnel syndrome 01/09/2018   S/P colonoscopy April 2011   Dr. Juanda Chance: mild diverticulosis, hyperplastic polyps, repeat in 10 years   S/P endoscopy April 2011   Dr. Juanda Chance: no hiatal hernia, generous opening to antrum., patent gastrojejunostomy   Sciatic leg pain    Right   Ulnar neuropathy at elbow, right 01/09/2018   Vitamin D deficiency    Weakness of right lower extremity     SURGICAL HISTORY: Past Surgical History:  Procedure Laterality Date   anal fistulostomy     w/marsupialization an excision of anal tags   BACK SURGERY  10/2017   BREAST LUMPECTOMY Right    benign   CARPAL TUNNEL WITH CUBITAL TUNNEL Right 02/06/2018   Procedure: CARPAL TUNNEL RELEASE RIGHT TENOSYNOVECTOMY;  Surgeon: Cindee Salt, MD;  Location: [facility];  Service: Orthopedics;  Laterality: Right;  regional block   COLONOSCOPY     ENDOMETRIAL ABLATION     ESOPHAGOGASTRODUODENOSCOPY     GASTRIC BYPASS     mini gastric bypass in [place] reportedly   MOHS SURGERY Left    Inner knee   ROTATOR CUFF REPAIR  07/2017   ULNAR NERVE TRANSPOSITION Right  02/06/2018   Procedure: ULNAR NERVE DECOMPRESSION;  Surgeon: Cindee Salt, MD;  Location: [facility];  Service: Orthopedics;  Laterality: Right;  regional block    ULNAR TUNNEL RELEASE Right 02/06/2018   Procedure: CUBITAL TUNNEL RELEASE;  Surgeon: Cindee Salt, MD;  Location[place] [facility] [place];  Service: Orthopedics;  Laterality: Right;  regoinal block    SOCIAL HISTORY: Social History   Socioeconomic History   Marital status: Married    Spouse name: Not on file   Number of children: Not on file   Years of education: Not on file   Highest education level: Not on file  Occupational History   Occupation: Technician    Comment: [employer]   Tobacco Use   Smoking status: Former   Smokeless tobacco: Never   Tobacco comments:    quit 2006  Vaping Use   Vaping status: Never Used  Substance and Sexual Activity   Alcohol use: Yes    Alcohol/week: 1.0 standard drink of alcohol    Types: 1 Standard drinks or equivalent per week    Comment: social drinker   Drug use: No   Sexual activity: Yes    Partners: Male    Birth control/protection: None    Comment: spouse  Other Topics Concern   Not on file  Social History Narrative   Not on file   Social Determinants of Health   Financial Resource Strain: Not on file  Food Insecurity: Low Risk  (09/05/2023)   Received from [facility]   Hunger Vital Sign    Worried About Running Out of Food in the Last Year: Never true    Ran Out of Food in the Last Year: Never true  Transportation Needs: No Transportation Needs (09/05/2023)   Received from [facility]    In the past 12 months, has lack of reliable transportation kept you from medical appointments, meetings, work or from getting things needed for daily living? : No  Physical Activity: Not on file  Stress: Not on file  Social Connections: Not on file  Intimate Partner Violence: Not on file    FAMILY HISTORY: Family History   Problem Relation Age of Onset   COPD Mother        living   Heart disease Father        deceased   Colon cancer Neg Hx     ALLERGIES:  is allergic to penicillins.  MEDICATIONS:  Current Outpatient Medications  Medication Sig Dispense Refill   Calcium Carbonate-Vitamin D (CALCIUM 600/VITAMIN D PO) Take 1 tablet by mouth daily.     Cholecalciferol (VITAMIN D3) 50 MCG (2000 UT) capsule Take 2,000 Units by mouth daily.     DHEA 50 MG CAPS Take 50 mg by  mouth daily. 2 times a week     HUMIRA PEN 40 MG/0.4ML PNKT SMARTSIG:40 Milligram(s) SUB-Q Every 2 Weeks     ibuprofen (ADVIL) 600 MG tablet Take by mouth. As needed     levothyroxine (SYNTHROID) 100 MCG tablet Take 1 tablet (100 mcg total) by mouth daily. 90 tablet 3   magnesium oxide (MAG-OX) 400 MG tablet Take by mouth.     Naltrexone-buPROPion HCl ER (CONTRAVE) 8-90 MG TB12 2 pils bid (Patient not taking: Reported on 09/20/2023) 120 tablet 5   Turmeric (QC TUMERIC COMPLEX) 500 MG CAPS Take by mouth. daily     vitamin B-12 (CYANOCOBALAMIN) 1000 MCG tablet Take 1,000 mcg by mouth daily.     No current facility-administered medications for this visit.    REVIEW OF SYSTEMS:   Constitutional: ( - ) fevers, ( - )  chills , ( - ) night sweats Eyes: ( - ) blurriness of vision, ( - ) double vision, ( - ) watery eyes Ears, nose, mouth, throat, and face: ( - ) mucositis, ( - ) sore throat Respiratory: ( - ) cough, ( - ) dyspnea, ( - ) wheezes Cardiovascular: ( - ) palpitation, ( - ) chest discomfort, ( - ) lower extremity swelling Gastrointestinal:  ( - ) nausea, ( - ) heartburn, ( - ) change in bowel habits Skin: ( - ) abnormal skin rashes Lymphatics: ( - ) new lymphadenopathy, ( - ) easy bruising Neurological: ( - ) numbness, ( - ) tingling, ( - ) new weaknesses Behavioral/Psych: ( - ) mood change, ( - ) new changes  All other systems were reviewed with the patient and are negative.  PHYSICAL EXAMINATION:  Vitals:   09/20/23 1512   BP: (!) 105/56  Pulse: (!) 56  Resp: 16  Temp: 97.8 F (36.6 C)  SpO2: 98%       Filed Weights   09/20/23 1512  Weight: 224 lb 4 oz (101.7 kg)        GENERAL: well appearing middle aged Caucasian female alert, no distress and comfortable SKIN: skin color, texture, turgor are normal, no rashes or significant lesions EYES: conjunctiva are pink and non-injected, sclera clear LUNGS: clear to auscultation and percussion with normal breathing effort HEART: regular rate & rhythm and no murmurs and no lower extremity edema Musculoskeletal: no cyanosis of digits and no clubbing  PSYCH: alert & oriented x 3, fluent speech NEURO: no focal motor/sensory deficits  LABORATORY DATA:  I have reviewed the data as listed    Latest Ref Rng & Units 09/20/2023    2:57 PM 06/29/2023    9:36 AM 08/23/2022    9:42 AM  CBC  WBC 4.0 - 10.5 K/uL 5.3  4.1  5.3   Hemoglobin 12.0 - 15.0 g/dL 42.5  95.6  38.7   Hematocrit 36.0 - 46.0 % 39.7  37.8  37.9   Platelets 150 - 400 K/uL 248  295  287        Latest Ref Rng & Units 09/20/2023    2:57 PM 06/29/2023    9:36 AM 08/23/2022    9:42 AM  CMP  Glucose 70 - 99 mg/dL 564  93  89   BUN 8 - 23 mg/dL 9  7  11    Creatinine 0.44 - 1.00 mg/dL 3.32  9.51  8.84   Sodium 135 - 145 mmol/L 137  135  132   Potassium 3.5 - 5.1 mmol/L 3.9  3.9  3.9  Chloride 98 - 111 mmol/L 103  100  97   CO2 22 - 32 mmol/L 30  29  28    Calcium 8.9 - 10.3 mg/dL 9.2  9.1  9.5   Total Protein 6.5 - 8.1 g/dL 6.7  7.2  7.3   Total Bilirubin <1.2 mg/dL 0.3  0.4  0.5   Alkaline Phos 38 - 126 U/L 104  96  97   AST 15 - 41 U/L 31  21  20    ALT 0 - 44 U/L 36  18  22     RADIOGRAPHIC STUDIES: No results found.  ASSESSMENT & PLAN [name] 62 y.o. female with medical history significant for iron deficiency anemia 2/2 to Gastric bypass who presents for a follow up visit.   After review the labs, the records, schedule the patient the findings most consistent with iron  deficiency anemia secondary to a gastric bypass. She received the IV sucrose treatment x5 doses and had a increase in her hemoglobin up normal levels. Additionally her ferritin is above goal of 100 and her TIBC is returned to normal. Overall she is had an excellent response to the IV iron therapy. Given that the likely cause is poor absorption due to her gastric bypass we recommended she follow-up in 3 months time in order to assure that her iron stores are still replete. We will need at least periodic follow-up with her every 6 to 12 months in order to assure her iron levels are at goal.  # Iron Deficiency Anemia 2/2 to Gastric Bypass --patient completed 5 doses of IV iron sucrose 200mg  weekly from 11/13/2020-12/11/2020.  Last received Monoferric on 07/19/2023. --today labs show Hgb 12.8, MCV 90.8, platelets 248, white blood cell 5.3, LFTs and creatinine within normal limits. --due to gastric bipass may require future IV iron treatments. Would not recommend PO iron supplementation as it would likely not absorb well.  --recommend RTC in 6 months time to assure her iron stores remain replete.   No orders of the defined types were placed in this encounter.   All questions were answered. The patient knows to call the clinic with any problems, questions or concerns.  A total of more than 25 minutes were spent on this encounter and over half of that time was spent on counseling and coordination of care as outlined above.   Ulysees Barns, MD Department of Hematology/Oncology [facility] Phone: [phone] Pager: [phone] Email: [email or web address]  09/20/2023 3:59 PM

## 2023-09-20 NOTE — Progress Notes (Addendum)
   Acute Office Visit  Subjective:     Patient ID: CHI CRISE, female    DOB: 1961-05-21, 63 y.o.   MRN: [id number]   Dysuria    Patient is in today for concerns of UTI. She reports having a UTI about 2-3 weeks ago, treated by [facility]. She states yesterday she experienced some dysuria and frequency, however symptoms are better this morning with only some mild burning with urination. She denies fevers, nausea, vomiting, or flank pain.   Review of Systems  Constitutional:  Positive for malaise/fatigue.  Genitourinary:  Positive for dysuria.  All other systems reviewed and are negative.       Objective:     BP 118/72   Temp 97.9 F (36.6 C) (Oral)   Ht 5\' 4"  (1.626 m)   Wt 223 lb 3.2 oz (101.2 kg)   BMI 38.31 kg/m   Physical Exam Constitutional:      Appearance: Normal appearance.  Eyes:     Extraocular Movements: Extraocular movements intact.  Cardiovascular:     Rate and Rhythm: Normal rate and regular rhythm.  Pulmonary:     Effort: Pulmonary effort is normal.     Breath sounds: Normal breath sounds.  Abdominal:     General: Abdomen is flat.     Tenderness: There is no abdominal tenderness. There is no right CVA tenderness or left CVA tenderness.  Skin:    General: Skin is warm and dry.  Neurological:     General: No focal deficit present.     Mental Status: She is alert and oriented to person, place, and time.     Results for orders placed or performed in visit on 09/20/23  POCT urinalysis dipstick  Result Value Ref Range   Color, UA     Clarity, UA     Glucose, UA     Bilirubin, UA     Ketones, UA     Spec Grav, UA 1.010 1.010 - 1.025   Blood, UA     pH, UA 6.0 5.0 - 8.0   Protein, UA     Urobilinogen, UA     Nitrite, UA     Leukocytes, UA     Appearance     Odor          Assessment & Plan:  Dysuria Assessment & Plan: Urine dipstick clear today. Urine sent for culture, treatment pending results. Patient advised to increase fluid  intake and may use OTC Azo for symptom relief. She is to follow up if she experiences fevers, flank pain, nausea, vomiting, or if symptoms do not improve.  Orders: -     POCT urinalysis dipstick -     Urine Culture; Future  Urine culture positive. Macrobid sent to pharmacy.   Return if symptoms worsen or fail to improve.  Toni Amend Jiovanny Burdell, PA-C

## 2023-09-20 NOTE — Assessment & Plan Note (Signed)
Urine dipstick clear today. Urine sent for culture, treatment pending results. Patient advised to increase fluid intake and may use OTC Azo for symptom relief. She is to follow up if she experiences fevers, flank pain, nausea, vomiting, or if symptoms do not improve.

## 2023-09-20 NOTE — Patient Instructions (Signed)
Increase fluid intake  Azo OTC for relief of burning with urination.   Let me know if symptoms get worse!

## 2023-09-21 LAB — FERRITIN: Ferritin: 113 ng/mL (ref 11–307)

## 2023-09-22 ENCOUNTER — Encounter: Payer: Self-pay | Admitting: Physician Assistant

## 2023-09-22 MED ORDER — NITROFURANTOIN MONOHYD MACRO 100 MG PO CAPS: 100.0000 mg | ORAL_CAPSULE | Freq: Two times a day (BID) | ORAL | 0 refills | Status: AC

## 2023-09-22 NOTE — Addendum Note (Signed)
Addended by: Olga Millers on: 09/22/2023 12:10 PM   Modules accepted: Orders

## 2023-09-23 LAB — URINE CULTURE

## 2023-09-23 LAB — SPECIMEN STATUS REPORT

## 2023-09-27 ENCOUNTER — Telehealth: Payer: Self-pay | Admitting: *Deleted

## 2023-09-27 NOTE — Telephone Encounter (Signed)
Received vm message from pt requesting call back regarding her recent lab results. [name] patient and spoke with her.  Reviewed lab results. Her iron studies are god and she is not anemic. Pt concerned about her elevated glucose. Advised that we typically do not do fasting labs, so likely that particular result was obtained after she had eaten breakfast and lunch. Pt voiced understanding.  She is aware of her appts in June 2025

## 2023-10-16 ENCOUNTER — Ambulatory Visit: Payer: Self-pay | Admitting: Family Medicine

## 2023-10-16 NOTE — Telephone Encounter (Signed)
Copied from CRM [id number]. Topic: Clinical - Red Word Triage >> Oct 16, 2023  8:26 AM [name] wrote: Red Word that prompted transfer to Nurse Triage: had skin cancer surgery on legs , not sure if infected its itching for 4 days and try everything , and have a little swelling in feet   Chief Complaint: Itching Symptoms: Itching and swelling Frequency: A week Pertinent Negatives: Patient denies SOB and fever Disposition: [] ED /[] Urgent Care (no appt availability in office) / [x] Appointment(In office/virtual)/ []  [place] [facility]/ [] Home Care/ [] Refused Recommended Disposition /[] [facility]/ []  Follow-up with PCP Additional Notes: Patient called in complaining of itching that has been noticeable for about a week now. Patient had squamous cell surgery on one leg 2 months ago and the other leg 1 month ago. Patient wants to make sure there is no infection present. Patient stated that the itching originally started on her legs, but now she feels the itching all over the place. Itching is interfering with sleep. Patient denies fever and SOB. Patient is using Vaseline, aloe vera, and Benadryl to help the itching. Scheduled an appointment in office for tomorrow, 12/31. Advised patient to continue home remedies unless otherwise instructed.   Reason for Disposition  [1] Widespread itching AND [2] cause unknown AND [3] present > 48 hours  (Exception: Caller knows the cause and can eliminate it.)  Answer Assessment - Initial Assessment Questions 1. DESCRIPTION: "Describe the itching you are having."     Started on both legs, but now pretty widespread, raised skin/bumps on legs similar to irritated skin caused by medication reaction she had previously  2. SEVERITY: "How bad is it?"    - MILD: Doesn't interfere with normal activities.   - MODERATE-SEVERE: Interferes with work, school, sleep, or other activities.      Severe- "woke her up last night"  3. SCRATCHING: "Are there any scratch  marks? Bleeding?"     She is trying her best not to scratch it, denies broken skin, she "tries not to use fingernails to scratch"  4. ONSET: "When did this begin?"      Patient stated that it has been noticeable for about a week  5. CAUSE: "What do you think is causing the itching?" (ask about swimming pools, pollen, animals, soaps, etc.)     Patient had squamous cell surgery 2 months ago and wants to make sure it's not infected  6. OTHER SYMPTOMS: "Do you have any other symptoms?"      Swollen feet  Protocols used: Itching - [employer]

## 2023-10-17 ENCOUNTER — Ambulatory Visit: Payer: 59 | Admitting: Family Medicine

## 2023-10-17 VITALS — BP 112/74 | HR 59 | Temp 98.1°F | Ht 64.0 in | Wt 224.4 lb

## 2023-10-17 DIAGNOSIS — Z85828 Personal history of other malignant neoplasm of skin: Secondary | ICD-10-CM | POA: Diagnosis not present

## 2023-10-17 DIAGNOSIS — Z9889 Other specified postprocedural states: Secondary | ICD-10-CM | POA: Diagnosis not present

## 2023-10-17 DIAGNOSIS — L03115 Cellulitis of right lower limb: Secondary | ICD-10-CM

## 2023-10-17 MED ORDER — DOXYCYCLINE HYCLATE 100 MG PO TABS: 100.0000 mg | ORAL_TABLET | Freq: Two times a day (BID) | ORAL | 0 refills | Status: DC

## 2023-10-17 MED ORDER — MUPIROCIN 2 % EX OINT: TOPICAL_OINTMENT | CUTANEOUS | 0 refills | Status: DC

## 2023-10-17 MED ORDER — TRIAMCINOLONE ACETONIDE 0.1 % EX CREA: TOPICAL_CREAM | CUTANEOUS | 1 refills | Status: DC

## 2023-10-17 MED ORDER — HYDROXYZINE PAMOATE 25 MG PO CAPS: 25.0000 mg | ORAL_CAPSULE | Freq: Four times a day (QID) | ORAL | 1 refills | Status: DC | PRN

## 2023-10-17 NOTE — Progress Notes (Signed)
   Subjective:    Patient ID: [name], female    DOB: Jun 06, 1961, 62 y.o.   MRN: [id number]  HPI Patient has had several squamous cell cancers removed from her leg.  She had 1 of these areas and did not heal well.  She has been on Humira.  She denies any significant setbacks otherwise Denies any fever or chills Having spells of itching that pop up in various places on her skin she is not sure why this started up over the past few weeks.  No new soaps detergents or medications   Review of Systems     Objective:   Physical Exam Left hand has a reddened area that she has been itching out but no apparent rash with it She has healing areas of squamous cell cancer on her legs She has a reddened area with pustules that are not draining any pus currently minimal tenderness in the right lower leg where she had previous skin surgery       Assessment & Plan:  Possible secondary infection of that area She is not using the Humira currently Doxycycline  twice daily with a snack tall glass of water twice daily for 7 days Bactroban  ointment twice daily Follow-up with skin surgery center in the next few weeks Hydroxyzine  as needed for itching caution drowsiness Zyrtec take daily should help with some of the itching Triamcinolone  twice daily as needed Keep us  posted on the findings from dermatology

## 2024-01-17 ENCOUNTER — Other Ambulatory Visit: Payer: Self-pay

## 2024-01-17 MED ORDER — LEVOTHYROXINE SODIUM 100 MCG PO TABS: 100.0000 ug | ORAL_TABLET | Freq: Every day | ORAL | 3 refills | Status: AC

## 2024-03-13 ENCOUNTER — Telehealth: Payer: Self-pay | Admitting: *Deleted

## 2024-03-13 NOTE — Telephone Encounter (Signed)
 Copied from CRM [id number]. Topic: Clinical - Medical Advice >> Mar 13, 2024 11:16 AM Star East wrote: Reason for CRM: Dr Bascom Lily had labs done and blood sugar came back at 47, he suggested to have Dr Fairy Homer recheck. Please call [phone]

## 2024-03-13 NOTE — Telephone Encounter (Signed)
 I would recommend repeating metabolic 7 When glucoses in the 40s sometimes that can be a reflection of the patient possibly just not eating on a regular basis?  Obviously if she is having any symptoms or feeling sickly she needs a follow-up office visit with one of the providers If she is relatively feeling okay currently I would just recommend repeating her metabolic 7 due to hyperglycemia thank you

## 2024-03-13 NOTE — Telephone Encounter (Signed)
 Nurses-please talk with patient.  Try to figure out what is going on here.  Was this a routine blood work?  Was the patient symptomatic?  To her knowledge is she having low sugars or other issues?  How well is she eating?

## 2024-03-14 ENCOUNTER — Other Ambulatory Visit: Payer: Self-pay

## 2024-03-14 DIAGNOSIS — R739 Hyperglycemia, unspecified: Secondary | ICD-10-CM

## 2024-03-19 ENCOUNTER — Other Ambulatory Visit: Payer: Self-pay | Admitting: Hematology and Oncology

## 2024-03-19 DIAGNOSIS — D508 Other iron deficiency anemias: Secondary | ICD-10-CM

## 2024-03-20 ENCOUNTER — Inpatient Hospital Stay: Payer: 59 | Attending: Hematology and Oncology

## 2024-03-20 DIAGNOSIS — Z79899 Other long term (current) drug therapy: Secondary | ICD-10-CM | POA: Insufficient documentation

## 2024-03-20 DIAGNOSIS — Z8249 Family history of ischemic heart disease and other diseases of the circulatory system: Secondary | ICD-10-CM | POA: Diagnosis not present

## 2024-03-20 DIAGNOSIS — Z825 Family history of asthma and other chronic lower respiratory diseases: Secondary | ICD-10-CM | POA: Diagnosis not present

## 2024-03-20 DIAGNOSIS — M25519 Pain in unspecified shoulder: Secondary | ICD-10-CM | POA: Insufficient documentation

## 2024-03-20 DIAGNOSIS — Z880 Allergy status to penicillin: Secondary | ICD-10-CM | POA: Diagnosis not present

## 2024-03-20 DIAGNOSIS — Z8719 Personal history of other diseases of the digestive system: Secondary | ICD-10-CM | POA: Diagnosis not present

## 2024-03-20 DIAGNOSIS — D508 Other iron deficiency anemias: Secondary | ICD-10-CM

## 2024-03-20 DIAGNOSIS — E039 Hypothyroidism, unspecified: Secondary | ICD-10-CM | POA: Diagnosis not present

## 2024-03-20 DIAGNOSIS — M069 Rheumatoid arthritis, unspecified: Secondary | ICD-10-CM | POA: Insufficient documentation

## 2024-03-20 DIAGNOSIS — Z9884 Bariatric surgery status: Secondary | ICD-10-CM | POA: Diagnosis not present

## 2024-03-20 DIAGNOSIS — D509 Iron deficiency anemia, unspecified: Secondary | ICD-10-CM | POA: Insufficient documentation

## 2024-03-20 LAB — CMP (CANCER CENTER ONLY)
ALT: 25 U/L (ref 0–44)
AST: 27 U/L (ref 15–41)
Albumin: 4 g/dL (ref 3.5–5.0)
Alkaline Phosphatase: 107 U/L (ref 38–126)
Anion gap: 4 — ABNORMAL LOW (ref 5–15)
BUN: 8 mg/dL (ref 8–23)
CO2: 30 mmol/L (ref 22–32)
Calcium: 9 mg/dL (ref 8.9–10.3)
Chloride: 100 mmol/L (ref 98–111)
Creatinine: 0.7 mg/dL (ref 0.44–1.00)
GFR, Estimated: >60 mL/min (ref >60)
Glucose, Bld: 77 mg/dL (ref 70–99)
Potassium: 3.9 mmol/L (ref 3.5–5.1)
Sodium: 134 mmol/L — ABNORMAL LOW (ref 135–145)
Total Bilirubin: 0.4 mg/dL (ref 0.0–1.2)
Total Protein: 7.1 g/dL (ref 6.5–8.1)

## 2024-03-20 LAB — CBC WITH DIFFERENTIAL (CANCER CENTER ONLY)
Abs Immature Granulocytes: 0.04 10*3/uL (ref 0.00–0.07)
Basophils Absolute: 0 10*3/uL (ref 0.0–0.1)
Basophils Relative: 1 %
Eosinophils Absolute: 0.1 10*3/uL (ref 0.0–0.5)
Eosinophils Relative: 3 %
HCT: 40.6 % (ref 36.0–46.0)
Hemoglobin: 13.3 g/dL (ref 12.0–15.0)
Immature Granulocytes: 1 %
Lymphocytes Relative: 40 %
Lymphs Abs: 1.8 10*3/uL (ref 0.7–4.0)
MCH: 29.4 pg (ref 26.0–34.0)
MCHC: 32.8 g/dL (ref 30.0–36.0)
MCV: 89.6 fL (ref 80.0–100.0)
Monocytes Absolute: 0.6 10*3/uL (ref 0.1–1.0)
Monocytes Relative: 12 %
Neutro Abs: 1.9 10*3/uL (ref 1.7–7.7)
Neutrophils Relative %: 43 %
Platelet Count: 232 10*3/uL (ref 150–400)
RBC: 4.53 MIL/uL (ref 3.87–5.11)
RDW: 13.2 % (ref 11.5–15.5)
WBC Count: 4.4 10*3/uL (ref 4.0–10.5)
nRBC: 0 % (ref 0.0–0.2)

## 2024-03-20 LAB — IRON AND IRON BINDING CAPACITY (CC-WL,HP ONLY)
Iron: 116 ug/dL (ref 28–170)
Saturation Ratios: 33 % — ABNORMAL HIGH (ref 10.4–31.8)
TIBC: 351 ug/dL (ref 250–450)
UIBC: 235 ug/dL (ref 148–442)

## 2024-03-20 LAB — RETIC PANEL
Immature Retic Fract: 3.5 % (ref 2.3–15.9)
RBC.: 4.41 MIL/uL (ref 3.87–5.11)
Retic Count, Absolute: 41 10*3/uL (ref 19.0–186.0)
Retic Ct Pct: 0.9 % (ref 0.4–3.1)
Reticulocyte Hemoglobin: 34 pg (ref >27.9)

## 2024-03-20 LAB — FERRITIN: Ferritin: 164 ng/mL (ref 11–307)

## 2024-03-27 ENCOUNTER — Inpatient Hospital Stay: Admitting: Hematology and Oncology

## 2024-03-27 ENCOUNTER — Ambulatory Visit: Payer: 59 | Admitting: Physician Assistant

## 2024-03-27 VITALS — BP 117/52 | HR 69 | Temp 97.3°F | Resp 13 | Wt 221.5 lb

## 2024-03-27 DIAGNOSIS — D509 Iron deficiency anemia, unspecified: Secondary | ICD-10-CM | POA: Diagnosis not present

## 2024-03-27 DIAGNOSIS — D508 Other iron deficiency anemias: Secondary | ICD-10-CM

## 2024-03-27 NOTE — Progress Notes (Signed)
 [facility] [phone]   [phone]  PROGRESS NOTE  Patient Care Team: Bennet Brasil, MD as PCP - General (Family Medicine) Alyce Jubilee, MD (Inactive) (Gastroenterology) Larrie Po, MD as Consulting Physician (Rheumatology) Devon Fogo, MD (Inactive) as Consulting Physician (Dermatology)  Hematological/Oncological History # Iron  Deficiency Anemia 2/2 to Gastric Bypass 10/07/2020: WBC 5.2, Hgb 10.7, MCV 81, Plt 293. Iron  30, TIBC 447, Ferritin 9, Iron  sat 7%.  10/30/2020: establish care with Dr. Rosaline Coma  11/13/2020-12/11/2020: 5 doses of IV iron  sucrose 200mg  weekly 12/18/2020: WBC 4.5, Hgb 12.2, MCV 83.2, Plt 244 03/25/2021: WBC 5.1, Hgb 14.0, MCV 96.6, Plt 206 07/19/2023: IV monoferric  1000 mg x 1 dose  Interval History:  [name] 63 y.o. female with medical history significant for iron  deficiency anemia 2/2 to Gastric bipass who presents for a follow up visit. The patient's last visit was on 09/20/2023. In the interim since the last visit she has had no major changes in her health.  On exam today [name] reports she has been well overall in the interim since her last visit in December 2024.  She reports that she is having some upcoming arthroscopic work done on her shoulder.  She reports overall other than the shoulder pain she has been well.  She reports her energy is an 8 or 9 out of 10.  She reports that some days she feels lazy.  Her appetite is good.  She has had no recent episodes of bleeding, bruising, or dark stools.  She denies any runny nose, sore throat, cough.  She denies any fevers, chills, sweats.  Overall she feels well and has no questions concerns or complaints today.  A full 10 point ROS is otherwise negative.  MEDICAL HISTORY:  Past Medical History:  Diagnosis Date   Abdominal pain of unknown etiology    Anal fistula    hx of    Aortic atherosclerosis (HCC)    Constipation    severe, history of   DDD  (degenerative disc disease), lumbar    Depression    Diarrhea    history of   GERD (gastroesophageal reflux disease)    History of hiatal hernia    Hypothyroidism    Iron  deficiency anemia    Left shoulder pain    Low back pain    Lumbar herniated disc    Neck pain    Numbness of right lower extremity    Olecranon bursitis    Plantar fasciitis, left    Prediabetes    RA (rheumatoid arthritis) (HCC)    Right carpal tunnel syndrome 01/09/2018   S/P colonoscopy April 2011   Dr. Grandville Lax: mild diverticulosis, hyperplastic polyps, repeat in 10 years   S/P endoscopy April 2011   Dr. Grandville Lax: no hiatal hernia, generous opening to antrum., patent gastrojejunostomy   Sciatic leg pain    Right   Ulnar neuropathy at elbow, right 01/09/2018   Vitamin D  deficiency    Weakness of right lower extremity     SURGICAL HISTORY: Past Surgical History:  Procedure Laterality Date   anal fistulostomy     w/marsupialization an excision of anal tags   BACK SURGERY  10/2017   BREAST LUMPECTOMY Right    benign   CARPAL TUNNEL WITH CUBITAL TUNNEL Right 02/06/2018   Procedure: CARPAL TUNNEL RELEASE RIGHT TENOSYNOVECTOMY;  Surgeon: Lyanne Sample, MD;  Location: [facility];  Service: Orthopedics;  Laterality: Right;  regional block   COLONOSCOPY  ENDOMETRIAL ABLATION     ESOPHAGOGASTRODUODENOSCOPY     GASTRIC BYPASS     mini gastric bypass in [place] reportedly   MOHS SURGERY Left    Inner knee   ROTATOR CUFF REPAIR  07/2017   ULNAR NERVE TRANSPOSITION Right 02/06/2018   Procedure: ULNAR NERVE DECOMPRESSION;  Surgeon: Lyanne Sample, MD;  Location: [facility];  Service: Orthopedics;  Laterality: Right;  regional block    ULNAR TUNNEL RELEASE Right 02/06/2018   Procedure: CUBITAL TUNNEL RELEASE;  Surgeon: Lyanne Sample, MD;  Location: [facility];  Service: Orthopedics;  Laterality: Right;  regoinal block    SOCIAL HISTORY: Social History    Socioeconomic History   Marital status: Married    Spouse name: Not on file   Number of children: Not on file   Years of education: Not on file   Highest education level: Not on file  Occupational History   Occupation: Technician    Comment: [employer]   Tobacco Use   Smoking status: Former   Smokeless tobacco: Never   Tobacco comments:    quit 2006  Vaping Use   Vaping status: Never Used  Substance and Sexual Activity   Alcohol use: Yes    Alcohol/week: 1.0 standard drink of alcohol    Types: 1 Standard drinks or equivalent per week    Comment: social drinker   Drug use: No   Sexual activity: Yes    Partners: Male    Birth control/protection: None    Comment: spouse  Other Topics Concern   Not on file  Social History Narrative   Not on file   Social Drivers of Health   Financial Resource Strain: Not on file  Food Insecurity: Low Risk  (09/05/2023)   Received from [facility]   Hunger Vital Sign    Worried About Running Out of Food in the Last Year: Never true    Ran Out of Food in the Last Year: Never true  Transportation Needs: No Transportation Needs (09/05/2023)   Received from [facility]    In the past 12 months, has lack of reliable transportation kept you from medical appointments, meetings, work or from getting things needed for daily living? : No  Physical Activity: Not on file  Stress: Not on file  Social Connections: Not on file  Intimate Partner Violence: Not on file    FAMILY HISTORY: Family History  Problem Relation Age of Onset   COPD Mother        living   Heart disease Father        deceased   Colon cancer Neg Hx     ALLERGIES:  is allergic to penicillins.  MEDICATIONS:  Current Outpatient Medications  Medication Sig Dispense Refill   Calcium Carbonate-Vitamin D  (CALCIUM 600/VITAMIN D  PO) Take 1 tablet by mouth daily.     Cholecalciferol (VITAMIN D3) 50 MCG (2000 UT) capsule Take 2,000 Units by mouth  daily.     DHEA 50 MG CAPS Take 50 mg by mouth daily. 2 times a week     doxycycline  (VIBRA -TABS) 100 MG tablet Take 1 tablet (100 mg total) by mouth 2 (two) times daily. 14 tablet 0   HUMIRA PEN 40 MG/0.4ML PNKT SMARTSIG:40 Milligram(s) SUB-Q Every 2 Weeks     hydrOXYzine  (VISTARIL ) 25 MG capsule Take 1 capsule (25 mg total) by mouth every 6 (six) hours as needed for itching. 30 capsule 1   ibuprofen (ADVIL) 600  MG tablet Take by mouth. As needed     levothyroxine  (SYNTHROID ) 100 MCG tablet Take 1 tablet (100 mcg total) by mouth daily. 90 tablet 3   magnesium oxide (MAG-OX) 400 MG tablet Take by mouth.     mupirocin  ointment (BACTROBAN ) 2 % Apply thin amount twice daily for 7 to 10 days 15 g 0   Naltrexone-buPROPion HCl ER (CONTRAVE ) 8-90 MG TB12 2 pils bid (Patient not taking: Reported on 10/17/2023) 120 tablet 5   triamcinolone  cream (KENALOG ) 0.1 % Thin amount twice daily to areas that itch 28.4 g 1   Turmeric (QC TUMERIC COMPLEX) 500 MG CAPS Take by mouth. daily     vitamin B-12 (CYANOCOBALAMIN ) 1000 MCG tablet Take 1,000 mcg by mouth daily.     No current facility-administered medications for this visit.    REVIEW OF SYSTEMS:   Constitutional: ( - ) fevers, ( - )  chills , ( - ) night sweats Eyes: ( - ) blurriness of vision, ( - ) double vision, ( - ) watery eyes Ears, nose, mouth, throat, and face: ( - ) mucositis, ( - ) sore throat Respiratory: ( - ) cough, ( - ) dyspnea, ( - ) wheezes Cardiovascular: ( - ) palpitation, ( - ) chest discomfort, ( - ) lower extremity swelling Gastrointestinal:  ( - ) nausea, ( - ) heartburn, ( - ) change in bowel habits Skin: ( - ) abnormal skin rashes Lymphatics: ( - ) new lymphadenopathy, ( - ) easy bruising Neurological: ( - ) numbness, ( - ) tingling, ( - ) new weaknesses Behavioral/Psych: ( - ) mood change, ( - ) new changes  All other systems were reviewed with the patient and are negative.  PHYSICAL EXAMINATION:  There were no vitals  filed for this visit.      There were no vitals filed for this visit.       GENERAL: well appearing middle aged Caucasian female alert, no distress and comfortable SKIN: skin color, texture, turgor are normal, no rashes or significant lesions EYES: conjunctiva are pink and non-injected, sclera clear LUNGS: clear to auscultation and percussion with normal breathing effort HEART: regular rate & rhythm and no murmurs and no lower extremity edema Musculoskeletal: no cyanosis of digits and no clubbing  PSYCH: alert & oriented x 3, fluent speech NEURO: no focal motor/sensory deficits  LABORATORY DATA:  I have reviewed the data as listed    Latest Ref Rng & Units 03/20/2024    9:54 AM 09/20/2023    2:57 PM 06/29/2023    9:36 AM  CBC  WBC 4.0 - 10.5 K/uL 4.4  5.3  4.1   Hemoglobin 12.0 - 15.0 g/dL 91.4  78.2  95.6   Hematocrit 36.0 - 46.0 % 40.6  39.7  37.8   Platelets 150 - 400 K/uL 232  248  295        Latest Ref Rng & Units 03/20/2024    9:54 AM 09/20/2023    2:57 PM 06/29/2023    9:36 AM  CMP  Glucose 70 - 99 mg/dL 77  213  93   BUN 8 - 23 mg/dL 8  9  7    Creatinine 0.44 - 1.00 mg/dL 0.86  5.78  4.69   Sodium 135 - 145 mmol/L 134  137  135   Potassium 3.5 - 5.1 mmol/L 3.9  3.9  3.9   Chloride 98 - 111 mmol/L 100  103  100   CO2 22 -  32 mmol/L 30  30  29    Calcium 8.9 - 10.3 mg/dL 9.0  9.2  9.1   Total Protein 6.5 - 8.1 g/dL 7.1  6.7  7.2   Total Bilirubin 0.0 - 1.2 mg/dL 0.4  0.3  0.4   Alkaline Phos 38 - 126 U/L 107  104  96   AST 15 - 41 U/L 27  31  21    ALT 0 - 44 U/L 25  36  18     RADIOGRAPHIC STUDIES: No results found.  ASSESSMENT & PLAN RONIQUA KINTZ 63 y.o. female with medical history significant for iron  deficiency anemia 2/2 to Gastric bypass who presents for a follow up visit.   After review the labs, the records, schedule the patient the findings most consistent with iron  deficiency anemia secondary to a gastric bypass. She received the IV sucrose  treatment x5 doses and had a increase in her hemoglobin up normal levels. Additionally her ferritin is above goal of 100 and her TIBC is returned to normal. Overall she is had an excellent response to the IV iron  therapy. Given that the likely cause is poor absorption due to her gastric bypass we recommended she follow-up in 3 months time in order to assure that her iron  stores are still replete. We will need at least periodic follow-up with her every 6 to 12 months in order to assure her iron  levels are at goal.  # Iron  Deficiency Anemia 2/2 to Gastric Bypass --patient completed 5 doses of IV iron  sucrose 200mg  weekly from 11/13/2020-12/11/2020.  Last received Monoferric  on 07/19/2023. --today labs show white blood cell 4.4, hemoglobin 13.3, MCV 89.6, platelets 232 --due to gastric bipass will require future IV iron  treatments. Would not recommend PO iron  supplementation as it would likely not absorb well.  --recommend RTC in 12 months time with labs in 6 months to assure her iron  stores remain replete.   No orders of the defined types were placed in this encounter.   All questions were answered. The patient knows to call the clinic with any problems, questions or concerns.  A total of more than 30 minutes were spent on this encounter and over half of that time was spent on counseling and coordination of care as outlined above.   Rogerio Clay, MD Department of Hematology/Oncology [facility] Phone: [phone] Pager: [phone] Email: [email or web address]  03/27/2024 8:01 AM

## 2024-03-28 ENCOUNTER — Ambulatory Visit: Payer: Self-pay | Admitting: Family Medicine

## 2024-03-28 LAB — BASIC METABOLIC PANEL WITH GFR
BUN/Creatinine Ratio: 22 (ref 12–28)
BUN: 16 mg/dL (ref 8–27)
CO2: 21 mmol/L (ref 20–29)
Calcium: 9.5 mg/dL (ref 8.7–10.3)
Chloride: 100 mmol/L (ref 96–106)
Creatinine, Ser: 0.73 mg/dL (ref 0.57–1.00)
Glucose: 100 mg/dL — ABNORMAL HIGH (ref 70–99)
Potassium: 4.3 mmol/L (ref 3.5–5.2)
Sodium: 139 mmol/L (ref 134–144)
eGFR: 93 mL/min/{1.73_m2} (ref >59)

## 2024-04-16 ENCOUNTER — Encounter (HOSPITAL_COMMUNITY): Payer: Self-pay | Admitting: Occupational Therapy

## 2024-04-16 ENCOUNTER — Ambulatory Visit (HOSPITAL_COMMUNITY): Attending: Orthopedic Surgery | Admitting: Occupational Therapy

## 2024-04-16 DIAGNOSIS — M25512 Pain in left shoulder: Secondary | ICD-10-CM | POA: Insufficient documentation

## 2024-04-16 DIAGNOSIS — R29898 Other symptoms and signs involving the musculoskeletal system: Secondary | ICD-10-CM | POA: Diagnosis present

## 2024-04-16 DIAGNOSIS — M25612 Stiffness of left shoulder, not elsewhere classified: Secondary | ICD-10-CM | POA: Diagnosis present

## 2024-04-16 NOTE — Patient Instructions (Signed)

## 2024-04-16 NOTE — Therapy (Signed)
 OUTPATIENT OCCUPATIONAL THERAPY ORTHO EVALUATION  Patient Name: [name] MRN: [id number] DOB:05-23-1961, 63 y.o., female Today's Date: 04/16/2024   END OF SESSION:  OT End of Session - 04/16/24 1232     Visit Number 1    Number of Visits 5    Date for OT Re-Evaluation 05/17/24    Authorization Type [employer], $40 copay    OT Start Time 1120    OT Stop Time 1200    OT Time Calculation (min) 40 min    Activity Tolerance Patient tolerated treatment well    Behavior During Therapy WFL for tasks assessed/performed          Past Medical History:  Diagnosis Date   Abdominal pain of unknown etiology    Anal fistula    hx of    Aortic atherosclerosis (HCC)    Constipation    severe, history of   DDD (degenerative disc disease), lumbar    Depression    Diarrhea    history of   GERD (gastroesophageal reflux disease)    History of hiatal hernia    Hypothyroidism    Iron  deficiency anemia    Left shoulder pain    Low back pain    Lumbar herniated disc    Neck pain    Numbness of right lower extremity    Olecranon bursitis    Plantar fasciitis, left    Prediabetes    RA (rheumatoid arthritis) (HCC)    Right carpal tunnel syndrome 01/09/2018   S/P colonoscopy April 2011   Dr. Obie: mild diverticulosis, hyperplastic polyps, repeat in 10 years   S/P endoscopy April 2011   Dr. Obie: no hiatal hernia, generous opening to antrum., patent gastrojejunostomy   Sciatic leg pain    Right   Ulnar neuropathy at elbow, right 01/09/2018   Vitamin D  deficiency    Weakness of right lower extremity    Past Surgical History:  Procedure Laterality Date   anal fistulostomy     w/marsupialization an excision of anal tags   BACK SURGERY  10/2017   BREAST LUMPECTOMY Right    benign   CARPAL TUNNEL WITH CUBITAL TUNNEL Right 02/06/2018   Procedure: CARPAL TUNNEL RELEASE RIGHT TENOSYNOVECTOMY;  Surgeon: Murrell Kuba, MD;  Location: [facility];  Service: Orthopedics;   Laterality: Right;  regional block   COLONOSCOPY     ENDOMETRIAL ABLATION     ESOPHAGOGASTRODUODENOSCOPY     GASTRIC BYPASS     mini gastric bypass in [place] reportedly   MOHS SURGERY Left    Inner knee   ROTATOR CUFF REPAIR  07/2017   ULNAR NERVE TRANSPOSITION Right 02/06/2018   Procedure: ULNAR NERVE DECOMPRESSION;  Surgeon: Murrell Kuba, MD;  Location: [facility];  Service: Orthopedics;  Laterality: Right;  regional block    ULNAR TUNNEL RELEASE Right 02/06/2018   Procedure: CUBITAL TUNNEL RELEASE;  Surgeon: Murrell Kuba, MD;  Location: [facility] [place];  Service: Orthopedics;  Laterality: Right;  regoinal block   Patient Active Problem List   Diagnosis Date Noted   History of Mohs micrographic surgery for skin cancer 10/17/2023   Chronic low back pain 07/11/2022   Degeneration of lumbosacral intervertebral disc 07/11/2022   Dysuria 07/11/2022   Obesity 07/11/2022   Piriformis syndrome 07/11/2022   Lumbar radiculopathy 03/17/2020   Candidiasis of skin 01/31/2020   Increased body mass index 09/10/2019   Closed fracture of right distal radius 05/07/2019   Cough 08/25/2018   Entrapment  of right ulnar nerve 01/17/2018   Right carpal tunnel syndrome 01/09/2018   Ulnar neuropathy at elbow, right 01/09/2018   HNP (herniated nucleus pulposus), lumbar 11/01/2017   Elevated transaminase level 09/21/2015   Vitamin D  deficiency 09/21/2015   History of gastric bypass 09/21/2015   Iron  deficiency anemia 09/21/2015   Hypothyroidism 05/20/2014   Rheumatoid arthritis (HCC) 07/11/2013   Constipation 01/19/2011   Abdominal discomfort in left lower quadrant 01/19/2011   GERD 01/01/2010   HIATAL HERNIA 01/01/2010   ANAL FISTULA 01/01/2010   ABDOMINAL PAIN, UNSPECIFIED SITE 01/01/2010    PCP: Alphonsa Hamilton, MD REFERRING PROVIDER: Beverley Lye, MD  ONSET DATE: 03/28/24  REFERRING DIAG: R shoulder debridement  THERAPY DIAG:  Acute pain of left shoulder -  Plan: Ot plan of care cert/re-cert  Stiffness of left shoulder, not elsewhere classified - Plan: Ot plan of care cert/re-cert  Other symptoms and signs involving the musculoskeletal system - Plan: Ot plan of care cert/re-cert  Rationale for Evaluation and Treatment: Rehabilitation  SUBJECTIVE:   SUBJECTIVE STATEMENT: I've been going to exercise class Pt accompanied by: self  PERTINENT HISTORY: Pt' has been having L shoulder pain for ~1 year. She completed OT, which did not relieve the pain, received MRI, then was able to undergo shoulder debridement on 6/12.  PRECAUTIONS: Shoulder  WEIGHT BEARING RESTRICTIONS: Yes 5lbs  PAIN:  Are you having pain? No  FALLS: Has patient fallen in last 6 months? No  PLOF: Independent  PATIENT GOALS: To be able to pick up her 17# neice  NEXT MD VISIT: 05/06/24  OBJECTIVE:   HAND DOMINANCE: Right  ADLs: Overall ADLs: Pt unable to complete cooking and cleaning tasks due to limited mobility and strength.  FUNCTIONAL OUTCOME MEASURES: Quick Dash: 29.55  UPPER EXTREMITY ROM:       Assessed in seated, er/IR adducted  Active ROM Left eval  Shoulder flexion 153  Shoulder abduction 112  Shoulder internal rotation 90  Shoulder external rotation 81  (Blank rows = not tested)    UPPER EXTREMITY MMT:     Assessed in seated, er/IR adducted  MMT Left eval  Shoulder flexion 4/5  Shoulder abduction 4/5  Shoulder internal rotation 5/5  Shoulder external rotation 5/5  (Blank rows = not tested)  SENSATION: WFL  EDEMA: No swelling  OBSERVATIONS: Moderate fascial restrictions along the biceps and trapezius   TODAY'S TREATMENT:                                                                                                                              DATE:  04/16/24 -A/ROM: flexion, abduction, protraction, horizontal abduction, er/IR, x10   PATIENT EDUCATION: Education details: A/ROM Person educated: Patient Education method:  Programmer, multimedia, Facilities manager, and Handouts Education comprehension: verbalized understanding and returned demonstration  HOME EXERCISE PROGRAM: 7/1: A/ROM  GOALS: Goals reviewed with patient? Yes   SHORT TERM GOALS: Target date: 05/17/24  Pt will be provided with and educated on  HEP to improve mobility in LUE required for use during ADL completion.   Goal status: INITIAL  LONG TERM GOALS: Target date: 05/17/24  Pt will decrease pain in LUE to 3/10 or less to improve ability to sleep for 2+ consecutive hours without waking due to pain.   Goal status: INITIAL  2.  Pt will decrease LUE fascial restrictions to min amounts or less to improve mobility required for functional reaching tasks.   Goal status: INITIAL  3.  Pt will increase LUE A/ROM by 10 degrees to improve ability to use LUE when reaching overhead or behind back during dressing and bathing tasks.   Goal status: INITIAL  4.  Pt will increase LUE strength to 5/5 or greater to improve ability to use LUE when lifting or carrying items during meal preparation/housework/yardwork tasks.   Goal status: INITIAL  5.  Pt will return to highest level of function using LUE as non-dominant during functional task completion.   Goal status: INITIAL   ASSESSMENT:  CLINICAL IMPRESSION: Patient is a 63 y.o. female who was seen today for occupational therapy evaluation for L shoulder debridement. Pt presents with increased pain and fascial restrictions, decreased ROM, strength, and functional use of the LUE.   PERFORMANCE DEFICITS: in functional skills including in functional skills including ADLs, IADLs, coordination, tone, ROM, strength, pain, fascial restrictions, muscle spasms, and UE functional use.  IMPAIRMENTS: are limiting patient from ADLs, IADLs, rest and sleep, work, leisure, and social participation.   COMORBIDITIES: has no other co-morbidities that affects occupational performance. Patient will benefit from skilled OT to  address above impairments and improve overall function.  MODIFICATION OR ASSISTANCE TO COMPLETE EVALUATION: No modification of tasks or assist necessary to complete an evaluation.  OT OCCUPATIONAL PROFILE AND HISTORY: Problem focused assessment: Including review of records relating to presenting problem.  CLINICAL DECISION MAKING: LOW - limited treatment options, no task modification necessary  REHAB POTENTIAL: Good  EVALUATION COMPLEXITY: Low      PLAN:  OT FREQUENCY: 1x/week  OT DURATION: 4 weeks  PLANNED INTERVENTIONS: 97168 OT Re-evaluation, 97535 self care/ADL training, 02889 therapeutic exercise, 97530 therapeutic activity, 97112 neuromuscular re-education, 97140 manual therapy, 97035 ultrasound, 97010 moist heat, 97032 electrical stimulation (manual), passive range of motion, functional mobility training, energy conservation, coping strategies training, patient/family education, and DME and/or AE instructions  RECOMMENDED OTHER SERVICES: N/A  CONSULTED AND AGREED WITH PLAN OF CARE: Patient  PLAN FOR NEXT SESSION: Manual Therapy, A/ROM, scapular ROM and strengthening, Proximal shoulder exercises   Valentin Nightingale, OTR/L [facility] [phone] [name] Jillyn Nightingale, OT 04/16/2024, 12:33 PM

## 2024-05-03 ENCOUNTER — Encounter (HOSPITAL_COMMUNITY): Payer: Self-pay | Admitting: Occupational Therapy

## 2024-05-03 ENCOUNTER — Ambulatory Visit (HOSPITAL_COMMUNITY): Admitting: Occupational Therapy

## 2024-05-03 DIAGNOSIS — M25512 Pain in left shoulder: Secondary | ICD-10-CM

## 2024-05-03 DIAGNOSIS — M25612 Stiffness of left shoulder, not elsewhere classified: Secondary | ICD-10-CM

## 2024-05-03 DIAGNOSIS — R29898 Other symptoms and signs involving the musculoskeletal system: Secondary | ICD-10-CM

## 2024-05-03 NOTE — Therapy (Signed)
 OUTPATIENT OCCUPATIONAL THERAPY ORTHO TREATMENT NOTE  Patient Name: [name] MRN: [id number] DOB:1961/02/03, 63 y.o., female Today's Date: 05/03/2024   END OF SESSION:   05/03/24 1430  OT Visits / Re-Eval  Visit Number 2  Number of Visits 5  Date for OT Re-Evaluation 05/17/24  Authorization  Authorization Type [employer], $40 copay  OT Time Calculation  OT Start Time 1351  OT Stop Time 1430  OT Time Calculation (min) 39 min  End of Session  Activity Tolerance Patient tolerated treatment well  Behavior During Therapy WFL for tasks assessed/performed     Past Medical History:  Diagnosis Date   Abdominal pain of unknown etiology    Anal fistula    hx of    Aortic atherosclerosis (HCC)    Constipation    severe, history of   DDD (degenerative disc disease), lumbar    Depression    Diarrhea    history of   GERD (gastroesophageal reflux disease)    History of hiatal hernia    Hypothyroidism    Iron  deficiency anemia    Left shoulder pain    Low back pain    Lumbar herniated disc    Neck pain    Numbness of right lower extremity    Olecranon bursitis    Plantar fasciitis, left    Prediabetes    RA (rheumatoid arthritis) (HCC)    Right carpal tunnel syndrome 01/09/2018   S/P colonoscopy April 2011   Dr. Obie: mild diverticulosis, hyperplastic polyps, repeat in 10 years   S/P endoscopy April 2011   Dr. Obie: no hiatal hernia, generous opening to antrum., patent gastrojejunostomy   Sciatic leg pain    Right   Ulnar neuropathy at elbow, right 01/09/2018   Vitamin D  deficiency    Weakness of right lower extremity    Past Surgical History:  Procedure Laterality Date   anal fistulostomy     w/marsupialization an excision of anal tags   BACK SURGERY  10/2017   BREAST LUMPECTOMY Right    benign   CARPAL TUNNEL WITH CUBITAL TUNNEL Right 02/06/2018   Procedure: CARPAL TUNNEL RELEASE RIGHT TENOSYNOVECTOMY;  Surgeon: Murrell Kuba, MD;  [facility];  Service: Orthopedics;  Laterality: Right;  regional block   COLONOSCOPY     ENDOMETRIAL ABLATION     ESOPHAGOGASTRODUODENOSCOPY     GASTRIC BYPASS     mini gastric bypass in [place] reportedly   MOHS SURGERY Left    Inner knee   ROTATOR CUFF REPAIR  07/2017   ULNAR NERVE TRANSPOSITION Right 02/06/2018   Procedure: ULNAR NERVE DECOMPRESSION;  Surgeon: Murrell Kuba, MD;  Location: [facility];  Service: Orthopedics;  Laterality: Right;  regional block    ULNAR TUNNEL RELEASE Right 02/06/2018   Procedure: CUBITAL TUNNEL RELEASE;  Surgeon: Murrell Kuba, MD;  Location: [facility] [place];  Service: Orthopedics;  Laterality: Right;  regoinal block   Patient Active Problem List   Diagnosis Date Noted   History of Mohs micrographic surgery for skin cancer 10/17/2023   Chronic low back pain 07/11/2022   Degeneration of lumbosacral intervertebral disc 07/11/2022   Dysuria 07/11/2022   Obesity 07/11/2022   Piriformis syndrome 07/11/2022   Lumbar radiculopathy 03/17/2020   Candidiasis of skin 01/31/2020   Increased body mass index 09/10/2019   Closed fracture of right distal radius 05/07/2019   Cough 08/25/2018   Entrapment of right ulnar nerve 01/17/2018   Right carpal tunnel syndrome 01/09/2018  Ulnar neuropathy at elbow, right 01/09/2018   HNP (herniated nucleus pulposus), lumbar 11/01/2017   Elevated transaminase level 09/21/2015   Vitamin D  deficiency 09/21/2015   History of gastric bypass 09/21/2015   Iron  deficiency anemia 09/21/2015   Hypothyroidism 05/20/2014   Rheumatoid arthritis (HCC) 07/11/2013   Constipation 01/19/2011   Abdominal discomfort in left lower quadrant 01/19/2011   GERD 01/01/2010   HIATAL HERNIA 01/01/2010   ANAL FISTULA 01/01/2010   ABDOMINAL PAIN, UNSPECIFIED SITE 01/01/2010    PCP: Alphonsa Hamilton, MD REFERRING PROVIDER: Beverley Lye, MD  ONSET DATE: 03/28/24  REFERRING DIAG: R shoulder debridement  THERAPY DIAG:   No diagnosis found.  Rationale for Evaluation and Treatment: Rehabilitation  SUBJECTIVE:   SUBJECTIVE STATEMENT: I'm just  Pt accompanied by: self  PERTINENT HISTORY: Pt' has been having L shoulder pain for ~1 year. She completed OT, which did not relieve the pain, received MRI, then was able to undergo shoulder debridement on 6/12.  PRECAUTIONS: Shoulder  WEIGHT BEARING RESTRICTIONS: Yes 5lbs  PAIN:  Are you having pain? No  FALLS: Has patient fallen in last 6 months? No  PLOF: Independent  PATIENT GOALS: To be able to pick up her 17# neice  NEXT MD VISIT: 05/06/24  OBJECTIVE:   HAND DOMINANCE: Right  ADLs: Overall ADLs: Pt unable to complete cooking and cleaning tasks due to limited mobility and strength.  FUNCTIONAL OUTCOME MEASURES: Quick Dash: 29.55  UPPER EXTREMITY ROM:       Assessed in seated, er/IR adducted  Active ROM Left eval  Shoulder flexion 153  Shoulder abduction 112  Shoulder internal rotation 90  Shoulder external rotation 81  (Blank rows = not tested)    UPPER EXTREMITY MMT:     Assessed in seated, er/IR adducted  MMT Left eval  Shoulder flexion 4/5  Shoulder abduction 4/5  Shoulder internal rotation 5/5  Shoulder external rotation 5/5  (Blank rows = not tested)  SENSATION: WFL  EDEMA: No swelling  OBSERVATIONS: Moderate fascial restrictions along the biceps and trapezius   TODAY'S TREATMENT:                                                                                                                              DATE:  05/03/24 -Manual Therapy: myofascial release and trigger point applied to biceps, scapular, and trapezius in order to reduce pain and fascial restrictions in order to improve ROM.  -A/ROM: flexion, abduction, protraction, horizontal abduction, er/IR, x10 -Proximal Shoulder Exercises: paddles, criss cross, circles both directions, x10 each -Scapular Strengthening: red band, extension, retraction, rows,  x12 -ABC's green ball on the wall -UBE Level 1, 2.5' forwards and backwards  04/16/24 -A/ROM: flexion, abduction, protraction, horizontal abduction, er/IR, x10   PATIENT EDUCATION: Education details: Publishing rights manager Person educated: Patient Education method: Explanation, Demonstration, and Handouts Education comprehension: verbalized understanding and returned demonstration  HOME EXERCISE PROGRAM: 7/1: A/ROM 7/18: Scapular Strengthening  GOALS: Goals reviewed with patient? Yes  SHORT TERM GOALS: Target date: 05/17/24  Pt will be provided with and educated on HEP to improve mobility in LUE required for use during ADL completion.   Goal status: INITIAL  LONG TERM GOALS: Target date: 05/17/24  Pt will decrease pain in LUE to 3/10 or less to improve ability to sleep for 2+ consecutive hours without waking due to pain.   Goal status: IN PROGRESS  2.  Pt will decrease LUE fascial restrictions to min amounts or less to improve mobility required for functional reaching tasks.   Goal status: IN PROGRESS  3.  Pt will increase LUE A/ROM by 10 degrees to improve ability to use LUE when reaching overhead or behind back during dressing and bathing tasks.   Goal status: IN PROGRESS  4.  Pt will increase LUE strength to 5/5 or greater to improve ability to use LUE when lifting or carrying items during meal preparation/housework/yardwork tasks.   Goal status: IN PROGRESS  5.  Pt will return to highest level of function using LUE as non-dominant during functional task completion.   Goal status: IN PROGRESS   ASSESSMENT:  CLINICAL IMPRESSION: This session, pt continued with minimal pain and reported that her HEP is going well. She continues to improve with her ROM, achieving 80-85% of full ROM. OT added lower level strengthening and proximal shoulder exercises for endurance and improving strength to achieve IADL completion without pain or limitations. Verbal and tactile cuing  provided for positioning and technique throughout session.   PERFORMANCE DEFICITS: in functional skills including in functional skills including ADLs, IADLs, coordination, tone, ROM, strength, pain, fascial restrictions, muscle spasms, and UE functional use.    PLAN:  OT FREQUENCY: 1x/week  OT DURATION: 4 weeks  PLANNED INTERVENTIONS: 97168 OT Re-evaluation, 97535 self care/ADL training, 02889 therapeutic exercise, 97530 therapeutic activity, 97112 neuromuscular re-education, 97140 manual therapy, 97035 ultrasound, 97010 moist heat, 97032 electrical stimulation (manual), passive range of motion, functional mobility training, energy conservation, coping strategies training, patient/family education, and DME and/or AE instructions  RECOMMENDED OTHER SERVICES: N/A  CONSULTED AND AGREED WITH PLAN OF CARE: Patient  PLAN FOR NEXT SESSION: Manual Therapy, A/ROM, scapular ROM and strengthening, Proximal shoulder exercises   Valentin Nightingale, OTR/L [facility] [phone] Edgar Corrigan Jillyn Nightingale, OT 05/03/2024, 1:53 PM

## 2024-05-03 NOTE — Patient Instructions (Signed)

## 2024-05-07 ENCOUNTER — Encounter (HOSPITAL_COMMUNITY): Payer: Self-pay | Admitting: Occupational Therapy

## 2024-05-07 ENCOUNTER — Ambulatory Visit (HOSPITAL_COMMUNITY): Admitting: Occupational Therapy

## 2024-05-07 DIAGNOSIS — M25612 Stiffness of left shoulder, not elsewhere classified: Secondary | ICD-10-CM

## 2024-05-07 DIAGNOSIS — R29898 Other symptoms and signs involving the musculoskeletal system: Secondary | ICD-10-CM

## 2024-05-07 DIAGNOSIS — M25512 Pain in left shoulder: Secondary | ICD-10-CM

## 2024-05-07 NOTE — Therapy (Signed)
 OUTPATIENT OCCUPATIONAL THERAPY ORTHO TREATMENT NOTE  Patient Name: [name] MRN: [id number] DOB:12-28-1960, 63 y.o., female Today's Date: 05/07/2024   END OF SESSION:  OT End of Session - 05/07/24 0928     Visit Number 3    Number of Visits 5    Date for OT Re-Evaluation 05/17/24    Authorization Type [employer], $40 copay    OT Start Time 0848    OT Stop Time 0928    OT Time Calculation (min) 40 min    Activity Tolerance Patient tolerated treatment well    Behavior During Therapy WFL for tasks assessed/performed          Past Medical History:  Diagnosis Date   Abdominal pain of unknown etiology    Anal fistula    hx of    Aortic atherosclerosis (HCC)    Constipation    severe, history of   DDD (degenerative disc disease), lumbar    Depression    Diarrhea    history of   GERD (gastroesophageal reflux disease)    History of hiatal hernia    Hypothyroidism    Iron  deficiency anemia    Left shoulder pain    Low back pain    Lumbar herniated disc    Neck pain    Numbness of right lower extremity    Olecranon bursitis    Plantar fasciitis, left    Prediabetes    RA (rheumatoid arthritis) (HCC)    Right carpal tunnel syndrome 01/09/2018   S/P colonoscopy April 2011   Dr. Obie: mild diverticulosis, hyperplastic polyps, repeat in 10 years   S/P endoscopy April 2011   Dr. Obie: no hiatal hernia, generous opening to antrum., patent gastrojejunostomy   Sciatic leg pain    Right   Ulnar neuropathy at elbow, right 01/09/2018   Vitamin D  deficiency    Weakness of right lower extremity    Past Surgical History:  Procedure Laterality Date   anal fistulostomy     w/marsupialization an excision of anal tags   BACK SURGERY  10/2017   BREAST LUMPECTOMY Right    benign   CARPAL TUNNEL WITH CUBITAL TUNNEL Right 02/06/2018   Procedure: CARPAL TUNNEL RELEASE RIGHT TENOSYNOVECTOMY;  Surgeon: Murrell Kuba, MD;  Location: [facility];  Service: Orthopedics;   Laterality: Right;  regional block   COLONOSCOPY     ENDOMETRIAL ABLATION     ESOPHAGOGASTRODUODENOSCOPY     GASTRIC BYPASS     mini gastric bypass in [place] reportedly   MOHS SURGERY Left    Inner knee   ROTATOR CUFF REPAIR  07/2017   ULNAR NERVE TRANSPOSITION Right 02/06/2018   Procedure: ULNAR NERVE DECOMPRESSION;  Surgeon: Murrell Kuba, MD;  Location: [facility] [place];  Service: Orthopedics;  Laterality: Right;  regional block    ULNAR TUNNEL RELEASE Right 02/06/2018   Procedure: CUBITAL TUNNEL RELEASE;  Surgeon: Murrell Kuba, MD;  Location: [facility] [place];  Service: Orthopedics;  Laterality: Right;  regoinal block   Patient Active Problem List   Diagnosis Date Noted   History of Mohs micrographic surgery for skin cancer 10/17/2023   Chronic low back pain 07/11/2022   Degeneration of lumbosacral intervertebral disc 07/11/2022   Dysuria 07/11/2022   Obesity 07/11/2022   Piriformis syndrome 07/11/2022   Lumbar radiculopathy 03/17/2020   Candidiasis of skin 01/31/2020   Increased body mass index 09/10/2019   Closed fracture of right distal radius 05/07/2019   Cough 08/25/2018  Entrapment of right ulnar nerve 01/17/2018   Right carpal tunnel syndrome 01/09/2018   Ulnar neuropathy at elbow, right 01/09/2018   HNP (herniated nucleus pulposus), lumbar 11/01/2017   Elevated transaminase level 09/21/2015   Vitamin D  deficiency 09/21/2015   History of gastric bypass 09/21/2015   Iron  deficiency anemia 09/21/2015   Hypothyroidism 05/20/2014   Rheumatoid arthritis (HCC) 07/11/2013   Constipation 01/19/2011   Abdominal discomfort in left lower quadrant 01/19/2011   GERD 01/01/2010   HIATAL HERNIA 01/01/2010   ANAL FISTULA 01/01/2010   ABDOMINAL PAIN, UNSPECIFIED SITE 01/01/2010    PCP: Alphonsa Hamilton, MD REFERRING PROVIDER: Beverley Lye, MD  ONSET DATE: 03/28/24  REFERRING DIAG: R shoulder debridement  THERAPY DIAG:  Acute pain of left  shoulder  Stiffness of left shoulder, not elsewhere classified  Other symptoms and signs involving the musculoskeletal system  Rationale for Evaluation and Treatment: Rehabilitation  SUBJECTIVE:   SUBJECTIVE STATEMENT: It's just so sore Pt accompanied by: self  PERTINENT HISTORY: Pt' has been having L shoulder pain for ~1 year. She completed OT, which did not relieve the pain, received MRI, then was able to undergo shoulder debridement on 6/12.  PRECAUTIONS: Shoulder  WEIGHT BEARING RESTRICTIONS: Yes 5lbs  PAIN:  Are you having pain? No  FALLS: Has patient fallen in last 6 months? No  PLOF: Independent  PATIENT GOALS: To be able to pick up her 17# neice  NEXT MD VISIT: 05/06/24  OBJECTIVE:   HAND DOMINANCE: Right  ADLs: Overall ADLs: Pt unable to complete cooking and cleaning tasks due to limited mobility and strength.  FUNCTIONAL OUTCOME MEASURES: Quick Dash: 29.55  UPPER EXTREMITY ROM:       Assessed in seated, er/IR adducted  Active ROM Left eval  Shoulder flexion 153  Shoulder abduction 112  Shoulder internal rotation 90  Shoulder external rotation 81  (Blank rows = not tested)    UPPER EXTREMITY MMT:     Assessed in seated, er/IR adducted  MMT Left eval  Shoulder flexion 4/5  Shoulder abduction 4/5  Shoulder internal rotation 5/5  Shoulder external rotation 5/5  (Blank rows = not tested)  SENSATION: WFL  EDEMA: No swelling  OBSERVATIONS: Moderate fascial restrictions along the biceps and trapezius   TODAY'S TREATMENT:                                                                                                                              DATE:  05/07/24 -Manual Therapy: myofascial release and trigger point applied to biceps, scapular, and trapezius in order to reduce pain and fascial restrictions in order to improve ROM.  -A/ROM: flexion, abduction, protraction, horizontal abduction, er/IR, x12 -X to V arms, x12 -Goal Post arms,  x12 -Scapular Strengthening: green band, extension, retraction, rows, x15 -Shoulder Strengthening: red band, horizontal abduction, er, IR, abduction, flexion, x15 -UBE Level 2, 2' forwards and backwards  05/03/24 -Manual Therapy: myofascial release and trigger point applied to biceps, scapular,  and trapezius in order to reduce pain and fascial restrictions in order to improve ROM.  -A/ROM: flexion, abduction, protraction, horizontal abduction, er/IR, x10 -Proximal Shoulder Exercises: paddles, criss cross, circles both directions, x10 each -Scapular Strengthening: red band, extension, retraction, rows, x12 -ABC's green ball on the wall -UBE Level 1, 2.5' forwards and backwards  04/16/24 -A/ROM: flexion, abduction, protraction, horizontal abduction, er/IR, x10   PATIENT EDUCATION: Education details: Public relations account executive Person educated: Patient Education method: Explanation, Demonstration, and Handouts Education comprehension: verbalized understanding and returned demonstration  HOME EXERCISE PROGRAM: 7/1: A/ROM 7/18: Scapular Strengthening 7/22: Shoulder Strengthening  GOALS: Goals reviewed with patient? Yes   SHORT TERM GOALS: Target date: 05/17/24  Pt will be provided with and educated on HEP to improve mobility in LUE required for use during ADL completion.   Goal status: IN PROGRESS  LONG TERM GOALS: Target date: 05/17/24  Pt will decrease pain in LUE to 3/10 or less to improve ability to sleep for 2+ consecutive hours without waking due to pain.   Goal status: IN PROGRESS  2.  Pt will decrease LUE fascial restrictions to min amounts or less to improve mobility required for functional reaching tasks.   Goal status: IN PROGRESS  3.  Pt will increase LUE A/ROM by 10 degrees to improve ability to use LUE when reaching overhead or behind back during dressing and bathing tasks.   Goal status: IN PROGRESS  4.  Pt will increase LUE strength to 5/5 or greater to improve  ability to use LUE when lifting or carrying items during meal preparation/housework/yardwork tasks.   Goal status: IN PROGRESS  5.  Pt will return to highest level of function using LUE as non-dominant during functional task completion.   Goal status: IN PROGRESS   ASSESSMENT:  CLINICAL IMPRESSION: Pt reports that her pain is minimal and her HEP is going well daily. She continues to have popping sensations in her shoulder with all movements. OT added more theraband exercises this session, which she completed well, fatiguing with increased repetition. Verbal and tactile cuing provided for positioning and technique throughout session.   PERFORMANCE DEFICITS: in functional skills including in functional skills including ADLs, IADLs, coordination, tone, ROM, strength, pain, fascial restrictions, muscle spasms, and UE functional use.    PLAN:  OT FREQUENCY: 1x/week  OT DURATION: 4 weeks  PLANNED INTERVENTIONS: 97168 OT Re-evaluation, 97535 self care/ADL training, 02889 therapeutic exercise, 97530 therapeutic activity, 97112 neuromuscular re-education, 97140 manual therapy, 97035 ultrasound, 97010 moist heat, 97032 electrical stimulation (manual), passive range of motion, functional mobility training, energy conservation, coping strategies training, patient/family education, and DME and/or AE instructions  RECOMMENDED OTHER SERVICES: N/A  CONSULTED AND AGREED WITH PLAN OF CARE: Patient  PLAN FOR NEXT SESSION: Manual Therapy, A/ROM, scapular ROM and strengthening, Proximal shoulder exercises   [name], OTR/L [facility] [phone] [name], OT 05/07/2024, 9:34 AM

## 2024-05-07 NOTE — Patient Instructions (Signed)

## 2024-05-14 ENCOUNTER — Encounter (HOSPITAL_COMMUNITY): Payer: Self-pay | Admitting: Occupational Therapy

## 2024-05-14 ENCOUNTER — Ambulatory Visit (HOSPITAL_COMMUNITY): Admitting: Occupational Therapy

## 2024-05-14 DIAGNOSIS — M25612 Stiffness of left shoulder, not elsewhere classified: Secondary | ICD-10-CM

## 2024-05-14 DIAGNOSIS — M25512 Pain in left shoulder: Secondary | ICD-10-CM | POA: Diagnosis not present

## 2024-05-14 DIAGNOSIS — R29898 Other symptoms and signs involving the musculoskeletal system: Secondary | ICD-10-CM

## 2024-05-14 NOTE — Patient Instructions (Addendum)

## 2024-05-14 NOTE — Therapy (Signed)
 OUTPATIENT OCCUPATIONAL THERAPY ORTHO TREATMENT NOTE DISCHARGE NOTE  Patient Name: [name] MRN: [id number] DOB:10/21/1960, 63 y.o., female Today's Date: 05/14/2024  OCCUPATIONAL THERAPY DISCHARGE SUMMARY  Visits from Start of Care: 4  Current functional level related to goals / functional outcomes: Pt has met all OT goals.    Remaining deficits: Pt has no remaining deficits.   Education / Equipment: Pt has been provided a comprehensive HEP.    Plan: Patient agrees to discharge as all OT goals have been met. .       END OF SESSION:  OT End of Session - 05/14/24 0929     Visit Number 4    Number of Visits 5    Date for OT Re-Evaluation 05/17/24    Authorization Type [employer], $40 copay    OT Start Time 0853    OT Stop Time 0929    OT Time Calculation (min) 36 min    Activity Tolerance Patient tolerated treatment well    Behavior During Therapy WFL for tasks assessed/performed          Past Medical History:  Diagnosis Date   Abdominal pain of unknown etiology    Anal fistula    hx of    Aortic atherosclerosis (HCC)    Constipation    severe, history of   DDD (degenerative disc disease), lumbar    Depression    Diarrhea    history of   GERD (gastroesophageal reflux disease)    History of hiatal hernia    Hypothyroidism    Iron  deficiency anemia    Left shoulder pain    Low back pain    Lumbar herniated disc    Neck pain    Numbness of right lower extremity    Olecranon bursitis    Plantar fasciitis, left    Prediabetes    RA (rheumatoid arthritis) (HCC)    Right carpal tunnel syndrome 01/09/2018   S/P colonoscopy April 2011   Dr. Obie: mild diverticulosis, hyperplastic polyps, repeat in 10 years   S/P endoscopy April 2011   Dr. Obie: no hiatal hernia, generous opening to antrum., patent gastrojejunostomy   Sciatic leg pain    Right   Ulnar neuropathy at elbow, right 01/09/2018   Vitamin D  deficiency    Weakness of right lower extremity     Past Surgical History:  Procedure Laterality Date   anal fistulostomy     w/marsupialization an excision of anal tags   BACK SURGERY  10/2017   BREAST LUMPECTOMY Right    benign   CARPAL TUNNEL WITH CUBITAL TUNNEL Right 02/06/2018   Procedure: CARPAL TUNNEL RELEASE RIGHT TENOSYNOVECTOMY;  Surgeon: Murrell Kuba, MD;  Location: [facility];  Service: Orthopedics;  Laterality: Right;  regional block   COLONOSCOPY     ENDOMETRIAL ABLATION     ESOPHAGOGASTRODUODENOSCOPY     GASTRIC BYPASS     mini gastric bypass in [place] reportedly   MOHS SURGERY Left    Inner knee   ROTATOR CUFF REPAIR  07/2017   ULNAR NERVE TRANSPOSITION Right 02/06/2018   Procedure: ULNAR NERVE DECOMPRESSION;  Surgeon: Murrell Kuba, MD;  Location: [facility];  Service: Orthopedics;  Laterality: Right;  regional block    ULNAR TUNNEL RELEASE Right 02/06/2018   Procedure: CUBITAL TUNNEL RELEASE;  Surgeon: Murrell Kuba, MD;  Location: [facility] [place];  Service: Orthopedics;  Laterality: Right;  regoinal block   Patient Active Problem List   Diagnosis Date Noted  History of Mohs micrographic surgery for skin cancer 10/17/2023   Chronic low back pain 07/11/2022   Degeneration of lumbosacral intervertebral disc 07/11/2022   Dysuria 07/11/2022   Obesity 07/11/2022   Piriformis syndrome 07/11/2022   Lumbar radiculopathy 03/17/2020   Candidiasis of skin 01/31/2020   Increased body mass index 09/10/2019   Closed fracture of right distal radius 05/07/2019   Cough 08/25/2018   Entrapment of right ulnar nerve 01/17/2018   Right carpal tunnel syndrome 01/09/2018   Ulnar neuropathy at elbow, right 01/09/2018   HNP (herniated nucleus pulposus), lumbar 11/01/2017   Elevated transaminase level 09/21/2015   Vitamin D  deficiency 09/21/2015   History of gastric bypass 09/21/2015   Iron  deficiency anemia 09/21/2015   Hypothyroidism 05/20/2014   Rheumatoid arthritis (HCC) 07/11/2013    Constipation 01/19/2011   Abdominal discomfort in left lower quadrant 01/19/2011   GERD 01/01/2010   HIATAL HERNIA 01/01/2010   ANAL FISTULA 01/01/2010   ABDOMINAL PAIN, UNSPECIFIED SITE 01/01/2010    PCP: Alphonsa Hamilton, MD REFERRING PROVIDER: Beverley Lye, MD  ONSET DATE: 03/28/24  REFERRING DIAG: R shoulder debridement  THERAPY DIAG:  Acute pain of left shoulder  Stiffness of left shoulder, not elsewhere classified  Other symptoms and signs involving the musculoskeletal system  Rationale for Evaluation and Treatment: Rehabilitation  SUBJECTIVE:   SUBJECTIVE STATEMENT: I'm really feeling good Pt accompanied by: self  PERTINENT HISTORY: Pt' has been having L shoulder pain for ~1 year. She completed OT, which did not relieve the pain, received MRI, then was able to undergo shoulder debridement on 6/12.  PRECAUTIONS: Shoulder  WEIGHT BEARING RESTRICTIONS: Yes 5lbs  PAIN:  Are you having pain? No  FALLS: Has patient fallen in last 6 months? No  PLOF: Independent  PATIENT GOALS: To be able to pick up her 17# neice  NEXT MD VISIT: 05/06/24  OBJECTIVE:   HAND DOMINANCE: Right  ADLs: Overall ADLs: Pt unable to complete cooking and cleaning tasks due to limited mobility and strength.  FUNCTIONAL OUTCOME MEASURES: Quick Dash: 29.55 05/14/24: 9.09  UPPER EXTREMITY ROM:       Assessed in seated, er/IR adducted  Active ROM Left eval Left 05/14/24  Shoulder flexion 153 155  Shoulder abduction 112 142  Shoulder internal rotation 90 90  Shoulder external rotation 81 90  (Blank rows = not tested)    UPPER EXTREMITY MMT:     Assessed in seated, er/IR adducted  MMT Left eval Left 05/14/24  Shoulder flexion 4/5 5/5  Shoulder abduction 4/5 5/5  Shoulder internal rotation 5/5 5/5  Shoulder external rotation 5/5 5/5  (Blank rows = not tested)  SENSATION: WFL  EDEMA: No swelling  OBSERVATIONS: Moderate fascial restrictions along the biceps and  trapezius   TODAY'S TREATMENT:                                                                                                                              DATE:  05/14/24 -A/ROM: flexion, abduction, protraction, horizontal abduction, er/IR, x12 -X to V arms, x12 -Goal Post arms, x12 -Stretching: flexion, corner stretch, er doorway stretch, towel behind the back, 4x15 -Measurements for reassessment  05/07/24 -Manual Therapy: myofascial release and trigger point applied to biceps, scapular, and trapezius in order to reduce pain and fascial restrictions in order to improve ROM.  -A/ROM: flexion, abduction, protraction, horizontal abduction, er/IR, x12 -X to V arms, x12 -Goal Post arms, x12 -Scapular Strengthening: green band, extension, retraction, rows, x15 -Shoulder Strengthening: red band, horizontal abduction, er, IR, abduction, flexion, x15 -UBE Level 2, 2' forwards and backwards  05/03/24 -Manual Therapy: myofascial release and trigger point applied to biceps, scapular, and trapezius in order to reduce pain and fascial restrictions in order to improve ROM.  -A/ROM: flexion, abduction, protraction, horizontal abduction, er/IR, x10 -Proximal Shoulder Exercises: paddles, criss cross, circles both directions, x10 each -Scapular Strengthening: red band, extension, retraction, rows, x12 -ABC's green ball on the wall -UBE Level 1, 2.5' forwards and backwards   PATIENT EDUCATION: Education details: Shoulder Stretching Person educated: Patient Education method: Explanation, Demonstration, and Handouts Education comprehension: verbalized understanding and returned demonstration  HOME EXERCISE PROGRAM: 7/1: A/ROM 7/18: Scapular Strengthening 7/22: Shoulder Strengthening 7/29: Shoulder Stretching  GOALS: Goals reviewed with patient? Yes   SHORT TERM GOALS: Target date: 05/17/24  Pt will be provided with and educated on HEP to improve mobility in LUE required for use during ADL  completion.   Goal status: MET  LONG TERM GOALS: Target date: 05/17/24  Pt will decrease pain in LUE to 3/10 or less to improve ability to sleep for 2+ consecutive hours without waking due to pain.   Goal status: MET  2.  Pt will decrease LUE fascial restrictions to min amounts or less to improve mobility required for functional reaching tasks.   Goal status: MET  3.  Pt will increase LUE A/ROM by 10 degrees to improve ability to use LUE when reaching overhead or behind back during dressing and bathing tasks.   Goal status: MET  4.  Pt will increase LUE strength to 5/5 or greater to improve ability to use LUE when lifting or carrying items during meal preparation/housework/yardwork tasks.   Goal status: MET  5.  Pt will return to highest level of function using LUE as non-dominant during functional task completion.   Goal status: MET   ASSESSMENT:  CLINICAL IMPRESSION: Pt completed reassessment this session and has met all OT goals. She is demonstrating improved ROM and strength. OT added stretches to continue improving patients ROM and tension. Pt has no further skilled OT needs and will be discharged from OT.   PERFORMANCE DEFICITS: in functional skills including in functional skills including ADLs, IADLs, coordination, tone, ROM, strength, pain, fascial restrictions, muscle spasms, and UE functional use.    PLAN:  OT FREQUENCY: 1x/week  OT DURATION: 4 weeks  PLANNED INTERVENTIONS: 97168 OT Re-evaluation, 97535 self care/ADL training, 02889 therapeutic exercise, 97530 therapeutic activity, 97112 neuromuscular re-education, 97140 manual therapy, 97035 ultrasound, 97010 moist heat, 97032 electrical stimulation (manual), passive range of motion, functional mobility training, energy conservation, coping strategies training, patient/family education, and DME and/or AE instructions  RECOMMENDED OTHER SERVICES: N/A  CONSULTED AND AGREED WITH PLAN OF CARE: Patient  PLAN FOR  NEXT SESSION: Manual Therapy, A/ROM, scapular ROM and strengthening, Proximal shoulder exercises   Valentin Nightingale, OTR/L [facility] [phone] Kanya Potteiger Jillyn Nightingale, OT 05/14/2024, 12:11 PM

## 2024-05-21 ENCOUNTER — Encounter (HOSPITAL_COMMUNITY): Admitting: Occupational Therapy

## 2024-07-13 LAB — LIPID PANEL
Cholesterol: 175
HDL: 91
LDL: 73
Triglycerides: 53 (ref 40–160)

## 2024-07-13 LAB — POCT ABI - SCREENING FOR PILOT NO CHARGE
Left ABI: 1.26
Right ABI: 1.3

## 2024-07-13 LAB — AMB RESULTS CONSOLE CBG: Glucose: 83

## 2024-07-13 NOTE — Progress Notes (Signed)
 Patient has a PCP, insurance, and is not a smoker. Patient declined SDOH screening questions. BP was 111/73

## 2024-08-23 NOTE — Progress Notes (Signed)
 Pt attended 07/13/2024 screening event with BP of 111/73 and blood sugar was 83. Pt noted at event that she does have a PCP. At event pt did not indicate any SDOH needs. Pt also noted that she is not a smoker and listed Private as her insurance at the event.   Per initial f/u pt was reached out via phone and was left vm. Pt was also mailed abnormal results letter per initial f/u.   Per chart review pt does have a PCP (Scott Luking; [facility]), insurance, and is not a smoker. Pt's last appt with PCP was 10/17/2023 and PCP referred pt to oncologist. Pt has been seen frequently by oncologist and has upcoming appts on 09/18/2024 & 03/19/2025. Pt does not indicate any SDOH needs at this time.  No additional pt f/u to be scheduled at this time per health equity protocol.

## 2024-09-18 ENCOUNTER — Inpatient Hospital Stay: Attending: Hematology and Oncology

## 2024-09-18 ENCOUNTER — Inpatient Hospital Stay

## 2024-09-18 DIAGNOSIS — Z8249 Family history of ischemic heart disease and other diseases of the circulatory system: Secondary | ICD-10-CM | POA: Insufficient documentation

## 2024-09-18 DIAGNOSIS — Z825 Family history of asthma and other chronic lower respiratory diseases: Secondary | ICD-10-CM | POA: Insufficient documentation

## 2024-09-18 DIAGNOSIS — D508 Other iron deficiency anemias: Secondary | ICD-10-CM | POA: Insufficient documentation

## 2024-09-18 DIAGNOSIS — Z79899 Other long term (current) drug therapy: Secondary | ICD-10-CM | POA: Diagnosis not present

## 2024-09-18 DIAGNOSIS — Z9884 Bariatric surgery status: Secondary | ICD-10-CM | POA: Diagnosis not present

## 2024-09-18 DIAGNOSIS — Z87891 Personal history of nicotine dependence: Secondary | ICD-10-CM | POA: Insufficient documentation

## 2024-09-18 LAB — CBC WITH DIFFERENTIAL (CANCER CENTER ONLY)
Abs Immature Granulocytes: 0.01 K/uL (ref 0.00–0.07)
Basophils Absolute: 0.1 K/uL (ref 0.0–0.1)
Basophils Relative: 1 %
Eosinophils Absolute: 0.3 K/uL (ref 0.0–0.5)
Eosinophils Relative: 4 %
HCT: 36.3 % (ref 36.0–46.0)
Hemoglobin: 12.3 g/dL (ref 12.0–15.0)
Immature Granulocytes: 0 %
Lymphocytes Relative: 40 %
Lymphs Abs: 2.7 K/uL (ref 0.7–4.0)
MCH: 30.5 pg (ref 26.0–34.0)
MCHC: 33.9 g/dL (ref 30.0–36.0)
MCV: 90.1 fL (ref 80.0–100.0)
Monocytes Absolute: 0.8 K/uL (ref 0.1–1.0)
Monocytes Relative: 13 %
Neutro Abs: 2.8 K/uL (ref 1.7–7.7)
Neutrophils Relative %: 42 %
Platelet Count: 271 K/uL (ref 150–400)
RBC: 4.03 MIL/uL (ref 3.87–5.11)
RDW: 13.1 % (ref 11.5–15.5)
WBC Count: 6.7 K/uL (ref 4.0–10.5)
nRBC: 0 % (ref 0.0–0.2)

## 2024-09-18 LAB — IRON AND IRON BINDING CAPACITY (CC-WL,HP ONLY)
Iron: 87 ug/dL (ref 28–170)
Saturation Ratios: 25 % (ref 10.4–31.8)
TIBC: 347 ug/dL (ref 250–450)
UIBC: 260 ug/dL

## 2024-09-18 LAB — CMP (CANCER CENTER ONLY)
ALT: 35 U/L (ref 0–44)
AST: 30 U/L (ref 15–41)
Albumin: 4.3 g/dL (ref 3.5–5.0)
Alkaline Phosphatase: 130 U/L — ABNORMAL HIGH (ref 38–126)
Anion gap: 10 (ref 5–15)
BUN: 7 mg/dL — ABNORMAL LOW (ref 8–23)
CO2: 27 mmol/L (ref 22–32)
Calcium: 9.2 mg/dL (ref 8.9–10.3)
Chloride: 101 mmol/L (ref 98–111)
Creatinine: 0.67 mg/dL (ref 0.44–1.00)
GFR, Estimated: >60 mL/min (ref >60)
Glucose, Bld: 67 mg/dL — ABNORMAL LOW (ref 70–99)
Potassium: 3.8 mmol/L (ref 3.5–5.1)
Sodium: 137 mmol/L (ref 135–145)
Total Bilirubin: 0.4 mg/dL (ref 0.0–1.2)
Total Protein: 7.3 g/dL (ref 6.5–8.1)

## 2024-09-18 LAB — VITAMIN B12: Vitamin B-12: 2488 pg/mL — ABNORMAL HIGH (ref 180–914)

## 2024-09-18 LAB — RETIC PANEL
Immature Retic Fract: 2.5 % (ref 2.3–15.9)
RBC.: 4.08 MIL/uL (ref 3.87–5.11)
Retic Count, Absolute: 46.5 K/uL (ref 19.0–186.0)
Retic Ct Pct: 1.1 % (ref 0.4–3.1)
Reticulocyte Hemoglobin: 34.8 pg (ref >27.9)

## 2024-09-18 LAB — FERRITIN: Ferritin: 189 ng/mL (ref 11–307)

## 2025-03-19 ENCOUNTER — Inpatient Hospital Stay

## 2025-03-26 ENCOUNTER — Inpatient Hospital Stay: Admitting: Hematology and Oncology
